# Patient Record
Sex: Female | Born: 1950 | Race: White | Hispanic: No | Smoking: Current every day smoker
Health system: Southern US, Community
[De-identification: ages and names within clinical notes are randomized; demographics above are authoritative.]

## PROBLEM LIST (undated history)

## (undated) DIAGNOSIS — I10 Essential (primary) hypertension: Secondary | ICD-10-CM

## (undated) DIAGNOSIS — Q248 Other specified congenital malformations of heart: Secondary | ICD-10-CM

## (undated) DIAGNOSIS — IMO0002 Reserved for concepts with insufficient information to code with codable children: Secondary | ICD-10-CM

## (undated) DIAGNOSIS — M8580 Other specified disorders of bone density and structure, unspecified site: Secondary | ICD-10-CM

## (undated) DIAGNOSIS — Z973 Presence of spectacles and contact lenses: Secondary | ICD-10-CM

## (undated) DIAGNOSIS — K469 Unspecified abdominal hernia without obstruction or gangrene: Secondary | ICD-10-CM

## (undated) DIAGNOSIS — I517 Cardiomegaly: Secondary | ICD-10-CM

## (undated) DIAGNOSIS — C50912 Malignant neoplasm of unspecified site of left female breast: Secondary | ICD-10-CM

## (undated) DIAGNOSIS — R011 Cardiac murmur, unspecified: Secondary | ICD-10-CM

## (undated) DIAGNOSIS — F17210 Nicotine dependence, cigarettes, uncomplicated: Secondary | ICD-10-CM

## (undated) DIAGNOSIS — E785 Hyperlipidemia, unspecified: Secondary | ICD-10-CM

## (undated) DIAGNOSIS — J42 Unspecified chronic bronchitis: Secondary | ICD-10-CM

## (undated) DIAGNOSIS — M1990 Unspecified osteoarthritis, unspecified site: Secondary | ICD-10-CM

## (undated) DIAGNOSIS — J189 Pneumonia, unspecified organism: Secondary | ICD-10-CM

## (undated) HISTORY — DX: Other specified congenital malformations of heart: Q24.8

## (undated) HISTORY — PX: COLONOSCOPY: SHX174

## (undated) HISTORY — PX: WISDOM TOOTH EXTRACTION: SHX21

## (undated) HISTORY — DX: Essential (primary) hypertension: I10

## (undated) HISTORY — PX: TUBAL LIGATION: SHX77

## (undated) HISTORY — DX: Unspecified abdominal hernia without obstruction or gangrene: K46.9

## (undated) HISTORY — DX: Nicotine dependence, cigarettes, uncomplicated: F17.210

## (undated) HISTORY — DX: Cardiomegaly: I51.7

## (undated) HISTORY — PX: TONSILLECTOMY: SUR1361

## (undated) HISTORY — DX: Hyperlipidemia, unspecified: E78.5

## (undated) HISTORY — DX: Other specified disorders of bone density and structure, unspecified site: M85.80

## (undated) HISTORY — PX: BREAST BIOPSY: SHX20

---

## 1998-08-29 ENCOUNTER — Other Ambulatory Visit: Admission: RE | Admit: 1998-08-29 | Discharge: 1998-08-29 | Payer: Self-pay | Admitting: Internal Medicine

## 1999-11-05 ENCOUNTER — Other Ambulatory Visit: Admission: RE | Admit: 1999-11-05 | Discharge: 1999-11-05 | Payer: Self-pay | Admitting: Internal Medicine

## 1999-12-07 ENCOUNTER — Encounter: Admission: RE | Admit: 1999-12-07 | Discharge: 1999-12-07 | Payer: Self-pay | Admitting: Internal Medicine

## 1999-12-07 ENCOUNTER — Encounter: Payer: Self-pay | Admitting: Internal Medicine

## 2000-12-17 ENCOUNTER — Encounter: Admission: RE | Admit: 2000-12-17 | Discharge: 2000-12-17 | Payer: Self-pay | Admitting: Family Medicine

## 2000-12-17 ENCOUNTER — Encounter: Payer: Self-pay | Admitting: Family Medicine

## 2000-12-23 ENCOUNTER — Encounter: Payer: Self-pay | Admitting: Family Medicine

## 2000-12-23 ENCOUNTER — Encounter: Admission: RE | Admit: 2000-12-23 | Discharge: 2000-12-23 | Payer: Self-pay | Admitting: Family Medicine

## 2002-02-15 ENCOUNTER — Encounter: Admission: RE | Admit: 2002-02-15 | Discharge: 2002-02-15 | Payer: Self-pay | Admitting: Family Medicine

## 2002-02-15 ENCOUNTER — Encounter: Payer: Self-pay | Admitting: Family Medicine

## 2003-02-23 ENCOUNTER — Encounter: Admission: RE | Admit: 2003-02-23 | Discharge: 2003-02-23 | Payer: Self-pay | Admitting: Family Medicine

## 2003-02-23 ENCOUNTER — Encounter: Payer: Self-pay | Admitting: Family Medicine

## 2003-03-22 ENCOUNTER — Encounter: Payer: Self-pay | Admitting: Family Medicine

## 2003-03-22 ENCOUNTER — Encounter: Admission: RE | Admit: 2003-03-22 | Discharge: 2003-03-22 | Payer: Self-pay | Admitting: Family Medicine

## 2003-03-31 ENCOUNTER — Encounter: Admission: RE | Admit: 2003-03-31 | Discharge: 2003-03-31 | Payer: Self-pay | Admitting: Family Medicine

## 2003-03-31 ENCOUNTER — Encounter: Payer: Self-pay | Admitting: Family Medicine

## 2004-03-28 ENCOUNTER — Encounter: Admission: RE | Admit: 2004-03-28 | Discharge: 2004-03-28 | Payer: Self-pay | Admitting: Family Medicine

## 2005-04-29 ENCOUNTER — Encounter: Admission: RE | Admit: 2005-04-29 | Discharge: 2005-04-29 | Payer: Self-pay | Admitting: Family Medicine

## 2006-06-19 ENCOUNTER — Encounter: Admission: RE | Admit: 2006-06-19 | Discharge: 2006-06-19 | Payer: Self-pay | Admitting: Family Medicine

## 2007-04-24 HISTORY — PX: US ECHOCARDIOGRAPHY: HXRAD669

## 2007-05-07 HISTORY — PX: CARDIOVASCULAR STRESS TEST: SHX262

## 2007-06-23 ENCOUNTER — Encounter: Admission: RE | Admit: 2007-06-23 | Discharge: 2007-06-23 | Payer: Self-pay | Admitting: Family Medicine

## 2007-07-20 ENCOUNTER — Encounter: Admission: RE | Admit: 2007-07-20 | Discharge: 2007-07-20 | Payer: Self-pay | Admitting: Family Medicine

## 2008-07-14 ENCOUNTER — Other Ambulatory Visit: Admission: RE | Admit: 2008-07-14 | Discharge: 2008-07-14 | Payer: Self-pay | Admitting: Family Medicine

## 2008-08-03 ENCOUNTER — Encounter: Admission: RE | Admit: 2008-08-03 | Discharge: 2008-08-03 | Payer: Self-pay | Admitting: Family Medicine

## 2009-08-16 ENCOUNTER — Encounter: Admission: RE | Admit: 2009-08-16 | Discharge: 2009-08-16 | Payer: Self-pay | Admitting: Family Medicine

## 2010-09-19 ENCOUNTER — Encounter: Admission: RE | Admit: 2010-09-19 | Discharge: 2010-09-19 | Payer: Self-pay | Admitting: *Deleted

## 2011-08-28 ENCOUNTER — Other Ambulatory Visit: Payer: Self-pay | Admitting: Cardiovascular Disease

## 2011-10-02 ENCOUNTER — Other Ambulatory Visit: Payer: Self-pay | Admitting: Family Medicine

## 2011-10-02 DIAGNOSIS — Z1231 Encounter for screening mammogram for malignant neoplasm of breast: Secondary | ICD-10-CM

## 2011-10-03 ENCOUNTER — Encounter: Payer: Self-pay | Admitting: Cardiovascular Disease

## 2011-10-09 ENCOUNTER — Ambulatory Visit (INDEPENDENT_AMBULATORY_CARE_PROVIDER_SITE_OTHER): Payer: BC Managed Care – PPO | Admitting: Cardiovascular Disease

## 2011-10-09 ENCOUNTER — Encounter: Payer: Self-pay | Admitting: Cardiovascular Disease

## 2011-10-09 DIAGNOSIS — E785 Hyperlipidemia, unspecified: Secondary | ICD-10-CM

## 2011-10-09 DIAGNOSIS — I421 Obstructive hypertrophic cardiomyopathy: Secondary | ICD-10-CM

## 2011-10-09 DIAGNOSIS — I10 Essential (primary) hypertension: Secondary | ICD-10-CM

## 2011-10-09 MED ORDER — ATORVASTATIN CALCIUM 20 MG PO TABS: 20.0000 mg | ORAL_TABLET | Freq: Every day | ORAL | Status: DC

## 2011-10-09 NOTE — Assessment & Plan Note (Signed)
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 She's on Toprol-XL. She does not have any significant murmur and I doubt that her gradient is significant. She's not having any symptoms. We'll continue with her same medications. I'll see her again in one year for office visit. We'll check fasting lipid profile at that time.

## 2011-10-09 NOTE — Assessment & Plan Note (Signed)
She's currently on simvastatin. I would prefer that she be on atorvastatin. We'll start her on atorvastatin 20 mg a day. We'll check a fasting lipid profile, basic metabolic profile, and HFP in 3 months and again in one year.

## 2011-10-09 NOTE — Progress Notes (Signed)
Patty Buckley Date of Birth  [birth year]  [facility] [address]    [address] [place], [place]  [address] [phone]  Fax  [phone]  History of Present Illness:  Patty Buckley 60 year old female the history of left ventricular outflow tract obstruction. She has a history of dyslipidemia and hypertension. She has done well since I last saw her. She's not had any episodes of chest pain or shortness of breath.  Current Outpatient Prescriptions on File Prior to Visit  Medication Sig Dispense Refill  . alendronate (FOSAMAX) 70 MG tablet Take 70 mg by mouth every 7 (seven) days. Take with a full glass of water on an empty stomach.       Marland Kitchen aspirin 81 MG tablet Take 81 mg by mouth daily.        . Choline Fenofibrate (TRILIPIX) 135 MG capsule Take 135 mg by mouth daily.        . Coenzyme Q10 (COQ10 PO) Take by mouth daily.        Marland Kitchen GLUCOSAMINE-CALCIUM-VIT D PO Take by mouth daily.        Marland Kitchen lisinopril (PRINIVIL,ZESTRIL) 20 MG tablet Take 20 mg by mouth daily.        . metoprolol (TOPROL-XL) 100 MG 24 hr tablet TAKE ONE TABLET BY MOUTH EVERY DAY  90 tablet  0  . Multiple Vitamin (MULTIVITAMIN) tablet Take 1 tablet by mouth daily.        . simvastatin (ZOCOR) 40 MG tablet Take 40 mg by mouth at bedtime.          Allergies  Allergen Reactions  . Codeine     Past Medical History  Diagnosis Date  . Hyperlipidemia   . Hypertension   . Left ventricular outflow obstruction   . Cigarette smoker     Past Surgical History  Procedure Date  . Tonsillectomy   . Tubal ligation   . US echocardiography 04/24/2007    EF 55-60%  . Cardiovascular stress test 05/07/2007    EF 82% NO ISCHEMIA    History  Smoking status  . Current Everyday Smoker -- 1.0 packs/day  Smokeless tobacco  . Not on file    History  Alcohol Use No    Family History  Problem Relation Age of Onset  . Hypertension Mother   . Heart attack Mother   . Heart failure Father   . Hypertension Father     Reviw  of Systems:  Reviewed in the HPI.  All other systems are negative.  Physical Exam: BP 118/75  Pulse 64  Ht 5\' 3"  (1.6 m)  Wt 172 lb 1.9 oz (78.073 kg)  BMI 30.49 kg/m2 The patient is alert and oriented x 3.  The mood and affect are normal.   Skin: warm and dry.  Color is normal.    HEENT:   Normocephalic/atraumatic. Her carotids are normal. There is no JVD.  Lungs: Her lungs are clear to auscultation   Heart: Regular rate, S1-S2  .  There is no significant murmur  Abdomen: Normal bowel sounds. There is no hepatosplenomegaly.  Extremities:  No clubbing cyanosis or edema  Neuro:  Nonfocal, her gait is normal    ECG: Normal sinus rhythm. Chest normal EKG.  Assessment / Plan:

## 2011-10-09 NOTE — Patient Instructions (Signed)
Your physician wants you to follow-up in: 1 year  You will receive a reminder letter in the mail two months in advance. If you don't receive a letter, please call our office to schedule the follow-up appointment.   Your physician has recommended you make the following change in your medication:  1) stop simvastatin  2) start atorvastatin 20 mg daily     Your physician recommends that you return for a FASTING lipid profile: 3 months and in 1 year

## 2011-10-28 ENCOUNTER — Ambulatory Visit
Admission: RE | Admit: 2011-10-28 | Discharge: 2011-10-28 | Disposition: A | Discharge status: Home / Self Care | Payer: BC Managed Care – PPO | Source: Ambulatory Visit | Attending: Family Medicine | Admitting: Family Medicine

## 2011-10-28 DIAGNOSIS — Z1231 Encounter for screening mammogram for malignant neoplasm of breast: Secondary | ICD-10-CM

## 2011-11-21 ENCOUNTER — Other Ambulatory Visit: Payer: Self-pay | Admitting: Cardiovascular Disease

## 2012-01-14 ENCOUNTER — Other Ambulatory Visit (INDEPENDENT_AMBULATORY_CARE_PROVIDER_SITE_OTHER): Payer: BC Managed Care – PPO | Admitting: *Deleted

## 2012-01-14 DIAGNOSIS — E785 Hyperlipidemia, unspecified: Secondary | ICD-10-CM

## 2012-01-14 DIAGNOSIS — I10 Essential (primary) hypertension: Secondary | ICD-10-CM

## 2012-01-14 LAB — BASIC METABOLIC PANEL
BUN: 12 mg/dL (ref 6–23)
Calcium: 9.3 mg/dL (ref 8.4–10.5)
Creatinine, Ser: 0.7 mg/dL (ref 0.4–1.2)
GFR: 88.74 mL/min (ref >60.00)
Glucose, Bld: 109 mg/dL — ABNORMAL HIGH (ref 70–99)

## 2012-01-14 LAB — HEPATIC FUNCTION PANEL
ALT: 38 U/L — ABNORMAL HIGH (ref 0–35)
AST: 50 U/L — ABNORMAL HIGH (ref 0–37)
Bilirubin, Direct: 0.1 mg/dL (ref 0.0–0.3)
Total Bilirubin: 1.1 mg/dL (ref 0.3–1.2)

## 2012-01-16 NOTE — Progress Notes (Signed)
MSG/TRYING TO REACH TO DC MED AND LAB 2-3 MONTHS

## 2012-01-17 ENCOUNTER — Telehealth: Payer: Self-pay | Admitting: *Deleted

## 2012-01-17 NOTE — Telephone Encounter (Signed)
Message copied by Antony Odea on Fri Jan 17, 2012 10:12 AM ------      Message from: [place], Deloris Ping      Created: Tue Jan 14, 2012  7:20 PM       LFTs are mildly elevated but not enough to warrant stopping atorvastatin.      Trigs are still elevated  . Continue to work on diet and exercise.

## 2012-01-17 NOTE — Telephone Encounter (Signed)
PT WILL STOP MED AND WILL CALL WHEN SHE CAN HAVE LABS RECHECKED.

## 2012-01-29 ENCOUNTER — Encounter (INDEPENDENT_AMBULATORY_CARE_PROVIDER_SITE_OTHER): Payer: Self-pay | Admitting: Surgery

## 2012-02-05 ENCOUNTER — Encounter (INDEPENDENT_AMBULATORY_CARE_PROVIDER_SITE_OTHER): Payer: Self-pay | Admitting: Surgery

## 2012-02-11 ENCOUNTER — Ambulatory Visit (INDEPENDENT_AMBULATORY_CARE_PROVIDER_SITE_OTHER): Payer: BC Managed Care – PPO | Admitting: Surgery

## 2012-05-20 ENCOUNTER — Other Ambulatory Visit: Payer: Self-pay | Admitting: Cardiovascular Disease

## 2012-05-20 NOTE — Telephone Encounter (Signed)
Fax Received. Refill Completed. Patty Buckley (R.M.A)   

## 2012-10-12 ENCOUNTER — Encounter: Payer: Self-pay | Admitting: Cardiovascular Disease

## 2012-10-12 ENCOUNTER — Ambulatory Visit (INDEPENDENT_AMBULATORY_CARE_PROVIDER_SITE_OTHER): Payer: Self-pay | Admitting: Cardiovascular Disease

## 2012-10-12 VITALS — BP 120/80 | HR 79 | Ht 63.0 in | Wt 163.8 lb

## 2012-10-12 DIAGNOSIS — I421 Obstructive hypertrophic cardiomyopathy: Secondary | ICD-10-CM

## 2012-10-12 DIAGNOSIS — E785 Hyperlipidemia, unspecified: Secondary | ICD-10-CM

## 2012-10-12 MED ORDER — METOPROLOL TARTRATE 50 MG PO TABS: 50.0000 mg | ORAL_TABLET | Freq: Two times a day (BID) | ORAL | Status: DC

## 2012-10-12 NOTE — Patient Instructions (Signed)
Your physician wants you to follow-up in: 1 year  You will receive a reminder letter in the mail two months in advance. If you don't receive a letter, please call our office to schedule the follow-up appointment.  Your physician has recommended you make the following change in your medication:   Stop toprol xl when finished and start metoprolol 50 mg one tablet twice daily 12 hours apart.

## 2012-10-12 NOTE — Progress Notes (Signed)
Patty Buckley Date of Birth  September 08, 1951 [facility] [address]    [address] [place], [place]  [address] [phone]  Fax  [phone]  Problem List: 1. Mild LVOT obstruction 2. HTN 3. Dyslipidemia  History of Present Illness:  Patty Buckley 61 year old female the history of left ventricular outflow tract obstruction. She has a history of dyslipidemia and hypertension. She has done well since I last saw her. She's not had any episodes of chest pain or shortness of breath.  She has lost her job and her insurance since I last saw her.  She has not had any problems.  Has not been exercising much.  Current Outpatient Prescriptions on File Prior to Visit  Medication Sig Dispense Refill  . acetaminophen (TYLENOL) 325 MG tablet Take 650 mg by mouth every 6 (six) hours as needed.      Marland Kitchen aspirin 81 MG tablet Take 81 mg by mouth daily.        . cholecalciferol (VITAMIN D) 1000 UNITS tablet Take 1,000 Units by mouth daily.      Marland Kitchen lisinopril (PRINIVIL,ZESTRIL) 20 MG tablet Take 20 mg by mouth daily.        . Multiple Vitamin (MULTIVITAMIN) tablet Take 1 tablet by mouth daily.        . metoprolol (LOPRESSOR) 50 MG tablet Take 1 tablet (50 mg total) by mouth 2 (two) times daily.  180 tablet  3    Allergies  Allergen Reactions  . Codeine     Past Medical History  Diagnosis Date  . Hyperlipidemia   . Hypertension   . Left ventricular outflow obstruction   . Cigarette smoker   . Vitamin D deficiency   . Osteopenia   . GERD (gastroesophageal reflux disease)   . Hemorrhoids   . Hernia   . Ventricular hypertrophy     Past Surgical History  Procedure Date  . Tonsillectomy   . Tubal ligation   . US echocardiography 04/24/2007    EF 55-60%  . Cardiovascular stress test 05/07/2007    EF 82% NO ISCHEMIA  . Wisdom tooth extraction     History  Smoking status  . Current Every Day Smoker -- 1.0 packs/day  Smokeless tobacco  . Not on file    History  Alcohol Use No     Family History  Problem Relation Age of Onset  . Hypertension Mother   . Heart attack Mother   . Heart failure Father   . Hypertension Father     Reviw of Systems:  Reviewed in the HPI.  All other systems are negative.  Physical Exam: BP 120/80  Pulse 79  Ht 5\' 3"  (1.6 m)  Wt 163 lb 12.8 oz (74.299 kg)  BMI 29.02 kg/m2  SpO2 95% The patient is alert and oriented x 3.  The mood and affect are normal.   Skin: warm and dry.  Color is normal.    HEENT:   Normocephalic/atraumatic. Her carotids are normal. There is no JVD.  Lungs: Her lungs are clear to auscultation   Heart: Regular rate, S1-S2  .  There is no significant murmur  Abdomen: Normal bowel sounds. There is no hepatosplenomegaly.  Extremities:  No clubbing cyanosis or edema  Neuro:  Nonfocal, her gait is normal    ECG:  Assessment / Plan:

## 2012-10-12 NOTE — Assessment & Plan Note (Signed)
She was on atorvastatin but it was stopped due to mild  liver elevation.  We recommended that she restart them but she has not done that due to cost and lack of insurance.

## 2012-10-12 NOTE — Assessment & Plan Note (Signed)
She is doing well.  We will change the metoprolol XL to metoprolol tartrate 50 BID to save her some money.  I have encouraged her to exercise and lose weight.

## 2012-10-23 ENCOUNTER — Encounter: Payer: Self-pay | Admitting: Cardiovascular Disease

## 2012-10-26 ENCOUNTER — Encounter: Payer: Self-pay | Admitting: Cardiovascular Disease

## 2013-02-08 ENCOUNTER — Other Ambulatory Visit: Payer: Self-pay

## 2013-02-08 MED ORDER — LISINOPRIL 20 MG PO TABS: 20.0000 mg | ORAL_TABLET | Freq: Every day | ORAL | Status: DC

## 2013-10-20 ENCOUNTER — Encounter: Payer: Self-pay | Admitting: Cardiovascular Disease

## 2013-10-29 ENCOUNTER — Encounter: Payer: Self-pay | Admitting: Cardiovascular Disease

## 2013-10-29 ENCOUNTER — Ambulatory Visit (INDEPENDENT_AMBULATORY_CARE_PROVIDER_SITE_OTHER): Payer: Self-pay | Admitting: Cardiovascular Disease

## 2013-10-29 VITALS — BP 142/68 | HR 89 | Ht 63.0 in | Wt 168.0 lb

## 2013-10-29 DIAGNOSIS — I421 Obstructive hypertrophic cardiomyopathy: Secondary | ICD-10-CM

## 2013-10-29 NOTE — Assessment & Plan Note (Signed)
Patty Buckley is doing well.  Continue with same meds.   Labs were ok a year ago.  She has lost her insurance and is trying to liimit the number of studies that she has.   No ECG today.  ;will consider fasting lipids next hear.

## 2013-10-29 NOTE — Patient Instructions (Addendum)
Your physician wants you to follow-up in: 1 year  You will receive a reminder letter in the mail two months in advance. If you don't receive a letter, please call our office to schedule the follow-up appointment.  [facility] Low Glycemic Diet (Source: [facility], 2006) Low Glycemic Foods (20-49) (Decrease risk of developing heart disease) Breakfast Cereals: All-Bran All-Bran Fruit 'n Oats Fiber One Oatmeal (not instant) Oat bran Fruits and fruit juices: (Limit to 1-2 servings per day) Apples Apricots (fresh & dried) Blackberries Blueberries Cherries Cranberries Peaches Pears Plums Prunes Grapefruit Raspberries Strawberries Tangerine Apple juice Grapefruit juice Tomato juice Beans and legumes (fresh-cooked): Black-eyed peas Butter beans Chick peas Lentils  Green beans Lima beans Kidney beans Navy beans Pinto beans Snow peas Non-starchy vegetables: Asparagus, avocado, broccoli, cabbage, cauliflower, celery, cucumber, greens, lettuce, mushrooms, peppers, tomatoes, okra, onions, spinach, summer squash Grains: Barley Bulgur Rye Wild rice Nuts and oils : Almonds Peanuts Sunflower seeds Hazelnuts Pecans Walnuts Oils that are liquid at room temperature Dairy, fish, meat, soy, and eggs: Milk, skim Lowfat cheese Yogurt, lowfat, fruit sugar sweetened Lean red meat Fish  Skinless chicken & [place] Shellfish Egg whites (up to 3 daily) Soy products  Egg yolks (up to 7 or _____ per week) Moderate Glycemic Foods (50-69) Breakfast Cereals: Bran Buds Bran Chex Just Right Mini-Wheats  Special K Swiss muesli Fruits: Banana (under-ripe) Dates Figs Grapes Kiwi Mango Oranges Raisins Fruit Juices: Cranberry juice Orange juice Beans and legumes: Boston-type baked beans Canned pinto, kidney, or navy beans Green peas Vegetables: Beets Carrots  Sweet potato Yam Corn on the cob Breads: Pita (pocket) bread Oat bran bread Pumpernickel bread Rye bread Wheat  bread, high fiber  Grains: Cornmeal Rice, brown Rice, white Couscous Pasta: Macaroni Pizza, cheese Ravioli, meat filled Spaghetti, white  Nuts: Cashews Macadamia Snacks: Chocolate Ice cream, lowfat Muffin Popcorn High Glycemic Foods (70-100)  Breakfast Cereals: Cheerios Corn Chex Corn Flakes Cream of Wheat Grape Nuts Grape Nut Flakes Grits Nutri-Grain Puffed Rice Puffed Wheat Rice Chex Rice Krispies Shredded Wheat Team Total Fruits: Pineapple Watermelon Banana (over-ripe) Beverages: Sodas, sweet tea, pineapple juice Vegetables: Potato, baked, boiled, fried, mashed Jamaica fries Canned or frozen corn Parsnips Winter squash Breads: Most breads (white and whole grain) Bagels Bread sticks Bread stuffing Kaiser roll Dinner rolls Grains: Rice, instant Tapioca, with milk Candy and most cookies Snacks: Donuts Corn chips Jelly beans Pretzels Pastries

## 2013-10-29 NOTE — Progress Notes (Signed)
[name] Date of Birth  1951-01-07 [facility] [address]    [address] [place], [place]  [address] [phone]  Fax  [phone]  Problem List: 1. Mild LVOT obstruction 2. HTN 3. Dyslipidemia  History of Present Illness:  Patty Buckley 62 year old female the history of left ventricular outflow tract obstruction. She has a history of dyslipidemia and hypertension. She has done well since I last saw her. She's not had any episodes of chest pain or shortness of breath.  She has lost her job and her insurance since I last saw her.  She has not had any problems.  Has not been exercising much.  Nov. 21, 2014  No CP , no dyspnea.    BP at home have been well controlled.    Getting some exercise.   Still out of work.     Current Outpatient Prescriptions on File Prior to Visit  Medication Sig Dispense Refill  . acetaminophen (TYLENOL) 325 MG tablet Take 650 mg by mouth every 6 (six) hours as needed.      Marland Kitchen aspirin 81 MG tablet Take 81 mg by mouth daily.        . cholecalciferol (VITAMIN D) 1000 UNITS tablet Take 1,000 Units by mouth. occasional      . lisinopril (PRINIVIL,ZESTRIL) 20 MG tablet Take 1 tablet (20 mg total) by mouth daily.  90 tablet  3  . metoprolol (LOPRESSOR) 50 MG tablet Take 1 tablet (50 mg total) by mouth 2 (two) times daily.  180 tablet  3  . Multiple Vitamin (MULTIVITAMIN) tablet Take 1 tablet by mouth. occasionally       No current facility-administered medications on file prior to visit.    Allergies  Allergen Reactions  . Codeine     Past Medical History  Diagnosis Date  . Hyperlipidemia   . Hypertension   . Left ventricular outflow obstruction   . Cigarette smoker   . Vitamin D deficiency   . Osteopenia   . GERD (gastroesophageal reflux disease)   . Hemorrhoids   . Hernia   . Ventricular hypertrophy     Past Surgical History  Procedure Laterality Date  . Tonsillectomy    . Tubal ligation    . US echocardiography  04/24/2007    EF  55-60%  . Cardiovascular stress test  05/07/2007    EF 82% NO ISCHEMIA  . Wisdom tooth extraction      History  Smoking status  . Current Every Day Smoker -- 1.00 packs/day  Smokeless tobacco  . Not on file    History  Alcohol Use No    Family History  Problem Relation Age of Onset  . Hypertension Mother   . Heart attack Mother   . Heart failure Father   . Hypertension Father     Reviw of Systems:  Reviewed in the HPI.  All other systems are negative.  Physical Exam: BP 142/68  Pulse 89  Ht 5\' 3"  (1.6 m)  Wt 168 lb (76.204 kg)  BMI 29.77 kg/m2 The patient is alert and oriented x 3.  The mood and affect are normal.   Skin: warm and dry.  Color is normal.    HEENT:   Normocephalic/atraumatic. Her carotids are normal. There is no JVD.  Lungs: Her lungs are clear to auscultation   Heart: Regular rate, S1-S2  .  There is no significant murmur  Abdomen: Normal bowel sounds. There is no hepatosplenomegaly.  Extremities:  No clubbing cyanosis or edema  Neuro:  Nonfocal, her gait is normal    ECG:  Assessment / Plan:

## 2013-11-21 ENCOUNTER — Other Ambulatory Visit: Payer: Self-pay | Admitting: Cardiovascular Disease

## 2014-02-01 ENCOUNTER — Other Ambulatory Visit: Payer: Self-pay | Admitting: Cardiovascular Disease

## 2014-02-16 ENCOUNTER — Other Ambulatory Visit: Payer: Self-pay | Admitting: Cardiovascular Disease

## 2014-04-25 ENCOUNTER — Other Ambulatory Visit: Payer: Self-pay

## 2014-05-03 ENCOUNTER — Other Ambulatory Visit: Payer: Self-pay | Admitting: Cardiovascular Disease

## 2014-05-19 ENCOUNTER — Other Ambulatory Visit: Payer: Self-pay | Admitting: Cardiovascular Disease

## 2014-08-17 ENCOUNTER — Other Ambulatory Visit: Payer: Self-pay | Admitting: Cardiovascular Disease

## 2014-10-31 ENCOUNTER — Other Ambulatory Visit: Payer: Self-pay | Admitting: Cardiovascular Disease

## 2014-11-15 ENCOUNTER — Encounter: Payer: Self-pay | Admitting: Cardiovascular Disease

## 2014-11-15 ENCOUNTER — Ambulatory Visit (INDEPENDENT_AMBULATORY_CARE_PROVIDER_SITE_OTHER): Payer: Self-pay | Admitting: Cardiovascular Disease

## 2014-11-15 VITALS — BP 118/76 | HR 59 | Ht 63.0 in | Wt 167.0 lb

## 2014-11-15 DIAGNOSIS — I421 Obstructive hypertrophic cardiomyopathy: Secondary | ICD-10-CM

## 2014-11-15 DIAGNOSIS — E785 Hyperlipidemia, unspecified: Secondary | ICD-10-CM

## 2014-11-15 NOTE — Assessment & Plan Note (Signed)
Continue current meds. Encourage her to exercise

## 2014-11-15 NOTE — Progress Notes (Signed)
Patty Buckley Date of Birth  [birth year] [facility] [address]    [place], [place]  [address] [phone]  Fax  [phone]  Problem List: 1. Mild LVOT obstruction 2. HTN 3. Dyslipidemia  History of Present Illness:  Patty Buckley 63 year old female the history of left ventricular outflow tract obstruction. She has a history of dyslipidemia and hypertension. She has done well since I last saw her. She's not had any episodes of chest pain or shortness of breath.  She has lost her job and her insurance since I last saw her.  She has not had any problems.  Has not been exercising much.  Nov. 21, 2014  No CP , no dyspnea.    BP at home have been well controlled.    Getting some exercise.   Still out of work.    Dec. 8, 2015:  Patty Buckley is doing well.  She is walking regularly. No CP or dyspnea.  Still smoking.   Advised her to quit. Wants to wait and check lipids after May ( will have Medicare at that time) , no insurance at this time .    Current Outpatient Prescriptions on File Prior to Visit  Medication Sig Dispense Refill  . acetaminophen (TYLENOL) 325 MG tablet Take 650 mg by mouth every 6 (six) hours as needed.    Marland Kitchen aspirin 81 MG tablet Take 81 mg by mouth daily.      Marland Kitchen lisinopril (PRINIVIL,ZESTRIL) 20 MG tablet TAKE ONE TABLET BY MOUTH ONCE DAILY 90 tablet 1  . metoprolol (LOPRESSOR) 50 MG tablet TAKE ONE TABLET BY MOUTH TWICE DAILY 180 tablet 0  . Multiple Vitamin (MULTIVITAMIN) tablet Take 1 tablet by mouth. occasionally     No current facility-administered medications on file prior to visit.    Allergies  Allergen Reactions  . Codeine     Past Medical History  Diagnosis Date  . Hyperlipidemia   . Hypertension   . Left ventricular outflow obstruction   . Cigarette smoker   . Vitamin D deficiency   . Osteopenia   . GERD (gastroesophageal reflux disease)   . Hemorrhoids   . Hernia   . Ventricular hypertrophy     Past Surgical History   Procedure Laterality Date  . Tonsillectomy    . Tubal ligation    . US echocardiography  04/24/2007    EF 55-60%  . Cardiovascular stress test  05/07/2007    EF 82% NO ISCHEMIA  . Wisdom tooth extraction      History  Smoking status  . Current Every Day Smoker -- 1.00 packs/day  Smokeless tobacco  . Not on file    History  Alcohol Use No    Family History  Problem Relation Age of Onset  . Hypertension Mother   . Heart attack Mother   . Heart failure Father   . Hypertension Father     Reviw of Systems:  Reviewed in the HPI.  All other systems are negative.  Physical Exam: BP 118/76 mmHg  Pulse 59  Ht 5\' 3"  (1.6 m)  Wt 167 lb (75.751 kg)  BMI 29.59 kg/m2 The patient is alert and oriented x 3.  The mood and affect are normal.   Skin: warm and dry.  Color is normal.    HEENT:   Normocephalic/atraumatic. Her carotids are normal. There is no JVD.  Lungs: Her lungs are clear to auscultation   Heart: Regular rate, S1-S2  .  There is no significant murmur  Abdomen: Normal bowel sounds. There is no hepatosplenomegaly.  Extremities:  No clubbing cyanosis or edema  Neuro:  Nonfocal, her gait is normal    ECG: Dec. 8, 2015:   Sinus brady at 59. No St or T wave changes.   Assessment / Plan:

## 2014-11-15 NOTE — Patient Instructions (Signed)
Your physician recommends that you continue on your current medications as directed. Please refer to the Current Medication list given to you today.  Your physician wants you to follow-up in: 1 year with Dr. Nahser.  You will receive a reminder letter in the mail two months in advance. If you don't receive a letter, please call our office to schedule the follow-up appointment.  

## 2014-11-15 NOTE — Assessment & Plan Note (Signed)
Stable

## 2014-11-21 ENCOUNTER — Other Ambulatory Visit: Payer: Self-pay | Admitting: Cardiovascular Disease

## 2014-12-06 ENCOUNTER — Other Ambulatory Visit: Payer: Self-pay | Admitting: Emergency Medicine

## 2014-12-06 DIAGNOSIS — N6320 Unspecified lump in the left breast, unspecified quadrant: Secondary | ICD-10-CM

## 2014-12-15 ENCOUNTER — Ambulatory Visit
Admission: RE | Admit: 2014-12-15 | Discharge: 2014-12-15 | Disposition: A | Discharge status: Home / Self Care | Payer: No Typology Code available for payment source | Source: Ambulatory Visit | Attending: Emergency Medicine | Admitting: Emergency Medicine

## 2014-12-15 DIAGNOSIS — N6320 Unspecified lump in the left breast, unspecified quadrant: Secondary | ICD-10-CM

## 2014-12-16 ENCOUNTER — Other Ambulatory Visit: Payer: Self-pay | Admitting: Obstetrics and Gynecology

## 2014-12-16 DIAGNOSIS — N6320 Unspecified lump in the left breast, unspecified quadrant: Secondary | ICD-10-CM

## 2014-12-22 ENCOUNTER — Encounter (HOSPITAL_COMMUNITY): Payer: Self-pay

## 2014-12-22 ENCOUNTER — Ambulatory Visit (HOSPITAL_COMMUNITY)
Admission: RE | Admit: 2014-12-22 | Discharge: 2014-12-22 | Disposition: A | Discharge status: Home / Self Care | Payer: Medicaid Other | Source: Ambulatory Visit | Attending: Obstetrics and Gynecology | Admitting: Obstetrics and Gynecology

## 2014-12-22 VITALS — BP 110/68 | Temp 97.5°F | Ht 63.0 in | Wt 166.2 lb

## 2014-12-22 DIAGNOSIS — N63 Unspecified lump in breast: Secondary | ICD-10-CM | POA: Diagnosis not present

## 2014-12-22 DIAGNOSIS — N6310 Unspecified lump in the right breast, unspecified quadrant: Secondary | ICD-10-CM

## 2014-12-22 DIAGNOSIS — Z01419 Encounter for gynecological examination (general) (routine) without abnormal findings: Secondary | ICD-10-CM

## 2014-12-22 DIAGNOSIS — N644 Mastodynia: Secondary | ICD-10-CM | POA: Insufficient documentation

## 2014-12-22 NOTE — Patient Instructions (Addendum)
Explained to Patty Buckley that [facility] will cover Pap smears and co-testing every 5 years unless has a history of abnormal Pap smears. Referred patient to the [place] for left breast biopsy per recommendation. Appointment scheduled for Tuesday, December 27, 2014 at 1400. Patient aware of appointment and will be there. Smoking cessation discussed with patient. Referred patient to the [facility] Quitline and gave resources to the free smoking cessation classes offered at the [facility]. Patty Buckley verbalized understanding.

## 2014-12-22 NOTE — Progress Notes (Signed)
Complaints of right breast lump x 12 weeks that began getting red two weeks later. Patient complained of left breast pain that comes and goes. Patient rates pain at a 4 out of 10.  Pap Smear:  Pap smear completed today. Patients last Pap smear was 5 years ago at [facility] on [facility] and normal per patient. Per patient has no history of an abnormal Pap smear. No Pap smear results in EPIC.  Physical exam: Breasts Breasts symmetrical. No skin abnormalities right breast. Left uppe No nipple retraction bilateral breasts. No nipple discharge bilateral breasts. No lymphadenopathy. No lumps palpated right breast. Palpated a left breast mass in the left upper inner quadrant of the breast. Patient complained of tenderness when palpated left breast mass. Referred patient to the [facility] for left breast biopsy per recommendation. Appointment scheduled for Tuesday, December 27, 2014 at 1400.         Pelvic/Bimanual   Ext Genitalia No lesions, no swelling and no discharge observed on external genitalia.         Vagina Vagina pink and normal texture. No lesions or discharge observed in vagina.          Cervix Cervix is present. Cervix pink and of normal texture. No discharge observed.     Uterus Uterus is present and palpable. Uterus in normal position and normal size.        Adnexae Bilateral ovaries present and palpable. No tenderness on palpation.          Rectovaginal No rectal exam completed today since patient had no rectal complaints. No skin abnormalities observed on exam.     Smoking cessation discussed with patient. Referred patient to the [facility] Quitline and gave resources to the free smoking cessation classes offered at the [facility].

## 2014-12-26 LAB — CYTOLOGY - PAP

## 2014-12-27 ENCOUNTER — Ambulatory Visit
Admission: RE | Admit: 2014-12-27 | Discharge: 2014-12-27 | Disposition: A | Discharge status: Home / Self Care | Payer: No Typology Code available for payment source | Source: Ambulatory Visit | Attending: Obstetrics and Gynecology | Admitting: Obstetrics and Gynecology

## 2014-12-27 ENCOUNTER — Other Ambulatory Visit: Payer: Self-pay | Admitting: Obstetrics and Gynecology

## 2014-12-27 DIAGNOSIS — N6320 Unspecified lump in the left breast, unspecified quadrant: Secondary | ICD-10-CM

## 2014-12-28 ENCOUNTER — Other Ambulatory Visit: Payer: Self-pay | Admitting: Surgery

## 2014-12-28 DIAGNOSIS — C50912 Malignant neoplasm of unspecified site of left female breast: Secondary | ICD-10-CM

## 2014-12-29 ENCOUNTER — Other Ambulatory Visit: Payer: Self-pay | Admitting: Surgery

## 2014-12-29 ENCOUNTER — Other Ambulatory Visit: Payer: Self-pay

## 2014-12-29 DIAGNOSIS — C50912 Malignant neoplasm of unspecified site of left female breast: Secondary | ICD-10-CM

## 2015-01-02 ENCOUNTER — Ambulatory Visit (INDEPENDENT_AMBULATORY_CARE_PROVIDER_SITE_OTHER): Payer: Self-pay | Admitting: Surgery

## 2015-01-02 ENCOUNTER — Other Ambulatory Visit (INDEPENDENT_AMBULATORY_CARE_PROVIDER_SITE_OTHER): Payer: Self-pay | Admitting: Surgery

## 2015-01-02 DIAGNOSIS — C50912 Malignant neoplasm of unspecified site of left female breast: Secondary | ICD-10-CM

## 2015-01-02 NOTE — H&P (Signed)
History of Present Illness Patty Buckley. Doralee Kocak MD; 01/02/2015 12:51 PM) Patient words: New Breast Pt.  The patient is a 64 year old female who presents with breast cancer. Patient does not have PCP  B[facility]CP referral MRI pending Prognostic profile pending.   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 She had not had a mammogram in 4 years. The mass has become much more firm recently and there is one small area that has begun to protrude with reddening and tenderness of the skin. She underwent mammogram and ultrasound on 12/15/14 followed by biopsy on 12/27/14. The biopsy clip did not deploy but the tumor is easily palpable. An abnormal appearing lymph node in the left axilla was biopsied and showed no sign of carcinoma. The main tumor showed invasive mammary carcinoma but prognostic profile pending. Scheduled for MRI 01/06/15. No Oncology appointment has yet been made.      CLINICAL DATA: Patient presents for evaluation of palpable left breast abnormality.  EXAM: DIGITAL DIAGNOSTIC BILATERAL MAMMOGRAM WITH CAD  ULTRASOUND LEFT BREAST  COMPARISON: [facility]  ACR Breast Density Category b: There are scattered areas of fibroglandular density.  FINDINGS: Underlying the palpable marker within the upper left breast is a 5.6 cm irregular mass. No additional concerning masses, calcifications or areas of architectural distortion identified within either breast.  Mammographic images were processed with CAD.  On physical exam, I palpate a firm mass within the superior aspect of the left breast.  Ultrasound is performed, showing a 4.7 x 2.8 x 4.7 cm irregular solid mass within the left breast 1230 o'clock 4 cm from the nipple with internal color vascularity. There is a cortically thickened 1.4 cm left axillary lymph  node.  IMPRESSION: Suspicious left breast mass.  Cortically thickened left axillary lymph node.  RECOMMENDATION: Ultrasound-guided core needle biopsy left breast mass and left axillary lymph node.  Patient will be referred to [facility].  I have discussed the findings and recommendations with the patient. Results were also provided in writing at the conclusion of the visit. If applicable, a reminder letter will be sent to the patient regarding the next appointment.  BI-RADS CATEGORY 4: Suspicious.   Electronically Signed By: Lovey Newcomer M.D. On: 12/15/2014 13:06   CLINICAL DATA: Approximate 6 cm suspicious mass in the upper inner quadrant of the left breast, initially noted by the patient approximately 3 months ago, with rapid interval growth. Abnormal left axillary lymph nodes demonstrating cortical thickening on diagnostic imaging.  EXAM: ULTRASOUND GUIDED LEFT BREAST CORE NEEDLE BIOPSY  COMPARISON: Previous exams.  FINDINGS: I met with the patient and we discussed the procedure of ultrasound-guided biopsy, including benefits and alternatives. We discussed the high likelihood of a successful procedure. We discussed the risks of the procedure, including infection, bleeding, tissue injury, clip migration, and inadequate sampling. Informed written consent was given. The usual time-out protocol was performed immediately prior to the procedure.  Using sterile technique and 2% Lidocaine as local anesthetic, under direct ultrasound visualization, a 14 gauge Marquee core needle device was used to perform biopsy of the large mass in the upper inner quadrant of the left breast using a medial approach. At the conclusion of the procedure a ribbon shaped tissue marker clip was deployed into the biopsy cavity. Follow up 2 view mammogram was performed and dictated separately.  IMPRESSION: Ultrasound guided biopsy of  an approximate 6 cm suspicious mass in the upper-inner  quadrant of the left breast. No apparent complications.   Electronically Signed By: Hulan Saas M.D. On: 12/27/2014 15:41   CLINICAL DATA: Approximate 6 cm suspicious mass in the upper inner quadrant of the left breast, initially noted by the patient approximately 3 months ago, with rapid interval growth. Abnormal left axillary lymph nodes demonstrating cortical thickening on diagnostic imaging.  EXAM: ULTRASOUND GUIDED CORE NEEDLE BIOPSY OF A LEFT AXILLARY NODE  COMPARISON: Previous exams.  FINDINGS: I met with the patient and we discussed the procedure of ultrasound-guided biopsy, including benefits and alternatives. We discussed the high likelihood of a successful procedure. We discussed the risks of the procedure, including infection, bleeding, tissue injury, clip migration, and inadequate sampling. Informed written consent was given. The usual time-out protocol was performed immediately prior to the procedure.  Using sterile technique and 2% Lidocaine as local anesthetic, under direct ultrasound visualization, a 14 gauge [place] spring-loaded core needle device was used to perform biopsy of an abnormal left axillary node though with cortical thickening using an inferior approach. There were no immediate complications.  IMPRESSION: Ultrasound guided biopsy of an abnormal left axillary lymph node with cortical thickening. No apparent complications.  Electronically Signed By: Hulan Saas M.D. On: 12/27/2014 15:41   CLINICAL DATA: Ultrasound-guided core needle biopsy of a large mass in the upper-inner quadrant of the left breast earlier today. Confirmation of clip placement.  EXAM: DIAGNOSTIC LEFT MAMMOGRAM POST ULTRASOUND BIOPSY  COMPARISON: Previous exams.  FINDINGS: Mammographic images were obtained following ultrasound guided biopsy of a large mass in the upper-inner quadrant of the left breast. The ribbon shaped tissue marker clip is not  visible in the mass or elsewhere in the left breast, indicating that it did not deploy. Gas bubbles are present within the anterior portion of the mass related to the biopsy, indicating the biopsy samples were indeed obtained from the mass. Expected post biopsy changes are present without evidence of hematoma.  IMPRESSION: The ribbon shaped tissue marker clip did not deploy and is not present within the mass or elsewhere in the left breast. Due to the size of the mass, I elected not to go back and place a clip, as subsequent localization will not be hindered in the absence of the clip.  Final Assessment: Post Procedure Mammograms for Marker Placement  Electronically Signed: By: Hulan Saas M.D. On: 12/27/2014 15:40   ADDENDUM REPORT: 12/28/2014 14:00  ADDENDUM: I spoke with the patient on 12/28/2014 to relay pathology results from recent ultrasound-guided core needle biopsy left breast. Pathology results demonstrate left breast invasive mammary carcinoma. The lymph node was negative for carcinoma. Pathology results are concordant with imaging findings. Patient has an appointment with Dr. Corliss Skains on 01/02/2015 at 10:45 a.m. Additionally she will be contacted with a preoperative MRI appointment. Patient reports no complaints at the biopsy site. She is instructed to call the breast center with any additional concerns or questions.   Electronically Signed By: Annia Belt M.D. On: 12/28/2014 14:00   Diagnosis 1. Breast, left, needle core biopsy, UIQ - INVASIVE MAMMARY CARCINOMA, SEE COMMENT. 2. Lymph node, needle/core biopsy, left axillary - ONE LYMPH NODE, NEGATIVE FOR TUMOR (0/1). SEE COMMENT. Microscopic Comment 1. Needle core biopsies demonstrate extensive involvement by invasive grade III mammary carcinoma with abundant necrosis. Breast prognostic studies are pending and will be reported in an addendum. The case was reviewed with Dr. Raynald Blend who concurs. 2.  Needle core biopsy does not exclude focal  involvement by metastatic tumor. (CRR:gt, 12/28/14) [place]i RUND DO Pathologist, Electronic Signature (Case signed 12/28/2014) Specimen   Other Problems (Crystal Dollard, RN, BSN; 01/02/2015 10:54 AM) Arthritis Back Pain Breast Cancer Heart murmur High blood pressure Hypercholesterolemia Lump In Breast Umbilical Hernia Repair  Past Surgical History Erasmo Leventhal, RN, BSN; 01/02/2015 10:54 AM) Breast Biopsy Left. Colon Polyp Removal - Colonoscopy Oral Surgery Tonsillectomy  Diagnostic Studies History Erasmo Leventhal, RN, BSN; 01/02/2015 10:54 AM) Colonoscopy 5-10 years ago Mammogram within last year Pap Smear 1-5 years ago  Allergies Erasmo Leventhal, RN, BSN; 01/02/2015 10:55 AM) Codeine Sulfate *ANALGESICS - OPIOID*  Medication History Erasmo Leventhal, RN, BSN; 01/02/2015 10:57 AM) Tylenol 8 Hour (650MG  Tablet ER, Oral as needed) Active. Aspirin EC (81MG  Tablet DR, Oral daily) Active. Lisinopril (20MG  Tablet, Oral daily) Active. Lopressor (50MG  Tablet, Oral daily) Active. Multiple Vitamin (1 (one) Oral daily) Active.  Social History (Erasmo Leventhal, RN, BSN; 01/02/2015 10:54 AM) Alcohol use Occasional alcohol use. Caffeine use Coffee. No drug use Tobacco use Current every day smoker.  Family History (Erasmo Leventhal, RN, BSN; 01/02/2015 10:54 AM) Heart Disease Father, Mother. Heart disease in female family member before age 25 Hypertension Father, Mother.  Pregnancy / Birth History Erasmo Leventhal, RN, BSN; 01/02/2015 10:54 AM) Age at menarche 29 years. Age of menopause 70-50 Gravida 2 Irregular periods Maternal age 62-30 Para 2     Review of Systems Patty Buckley. Anice Wilshire MD; 01/02/2015 12:38 PM) General Not Present- Appetite Loss, Chills, Fatigue, Fever, Night Sweats, Weight Gain and Weight Loss. Skin Present- New Lesions. Not Present- Change in Wart/Mole, Dryness, Hives, Jaundice,  Non-Healing Wounds, Rash and Ulcer. HEENT Present- Wears glasses/contact lenses. Not Present- Earache, Hearing Loss, Hoarseness, Nose Bleed, Oral Ulcers, Ringing in the Ears, Seasonal Allergies, Sinus Pain, Sore Throat, Visual Disturbances and Yellow Eyes. Respiratory Not Present- Bloody sputum, Chronic Cough, Difficulty Breathing, Snoring and Wheezing. Breast Present- Breast Mass, Breast Pain and Skin Changes. Not Present- Nipple Discharge. Cardiovascular Not Present- Chest Pain, Difficulty Breathing Lying Down, Leg Cramps, Palpitations, Rapid Heart Rate, Shortness of Breath and Swelling of Extremities. Gastrointestinal Not Present- Abdominal Pain, Bloating, Bloody Stool, Change in Bowel Habits, Chronic diarrhea, Constipation, Difficulty Swallowing, Excessive gas, Gets full quickly at meals, Hemorrhoids, Indigestion, Nausea, Rectal Pain and Vomiting. Female Genitourinary Not Present- Frequency, Nocturia, Painful Urination, Pelvic Pain and Urgency. Musculoskeletal Present- Back Pain and Joint Stiffness. Not Present- Joint Pain, Muscle Pain, Muscle Weakness and Swelling of Extremities. Neurological Not Present- Decreased Memory, Fainting, Headaches, Numbness, Seizures, Tingling, Tremor, Trouble walking and Weakness. Psychiatric Not Present- Anxiety, Bipolar, Change in Sleep Pattern, Depression, Fearful and Frequent crying. Endocrine Not Present- Cold Intolerance, Excessive Hunger, Hair Changes, Heat Intolerance, Hot flashes and New Diabetes. Hematology Not Present- Easy Bruising, Excessive bleeding, Gland problems, HIV and Persistent Infections.  Note: Menarche - age 70 First pregnancy - age 66 Breastfeed - yes Oral contraceptives - 31 years Menopause - age 83 Family history - negative for breast ca;   Vitals Occupational hygienist, BSN; 01/02/2015 10:59 AM) 01/02/2015 10:57 AM Weight: 164.8 lb Height: 63in Body Surface Area: 1.82 m Body Mass Index: 29.19 kg/m Temp.: 97.27F(Oral)   Pulse: 80 (Regular)  Resp.: 18 (Unlabored)  BP: 110/80 (Sitting, Left Arm, Standard)     Physical Exam Rodman Key K. Maekayla Giorgio MD; 01/02/2015 12:44 PM)  The physical exam findings are as follows: Note:WDWN in NAD HEENT: EOMI, sclera anicteric Neck: No masses, no thyromegaly Lungs: CTA bilaterally; normal respiratory effort Breasts: Right breast - no palpable masses,  no lymphadenopathy; no nipple retraction or discharge Left breast - no palpable lymphadenopathy; no nipple retraction or discharge; large 8 cm firm area involving the entire upper part of the breast, extending almost to the upper margin of the breast. In the center of this mass, there is a 1.5 cm area of erythema with firm tumor mass below the skin; no fluctuance noted. The remainder of the skin is normal in appearance. CV: Regular rate and rhythm; no murmurs Abd: +bowel sounds, soft, non-tender, no masses Ext: Well-perfused; no edema Skin: Warm, dry; no sign of jaundice    Assessment & Plan Rodman Key K. Brieanne Mignone MD; 01/02/2015 12:48 PM)  MALIGNANT NEOPLASM OF UPPER-INNER QUADRANT OF LEFT FEMALE BREAST (174.2  C50.212)  Current Plans Schedule for Surgery - right subclavian vein port placement. The surgical procedure has been discussed with the patient. Potential risks, benefits, alternative treatments, and expected outcomes have been explained. All of the patient's questions at this time have been answered. The likelihood of reaching the patient's treatment goal is good. The patient understand the proposed surgical procedure and wishes to proceed. Note:Large 6-7 cm invasive breast carcinoma - left upper breast.  Abnormal-appearing lymph nodes on ultrasound, but biopsy negative MRI pending. Appears to be some invasion of the skin with overlying erythema and tenderness  I discussed the situation with the patient. She will likely need neoadjuvant chemotherapy to try to shrink the tumor. She will likely still need mastectomy.  I discussed the risks and benefits of port placement with her.   We will refer her urgently to Oncology for neoadjuvant chemotherapy and will await her MRI results.   Patty Buckley. Georgette Dover, MD, [facility]  General/ Trauma Surgery  01/02/2015 12:51 PM

## 2015-01-03 ENCOUNTER — Telehealth (HOSPITAL_COMMUNITY): Payer: Self-pay | Admitting: *Deleted

## 2015-01-03 NOTE — Telephone Encounter (Signed)
Patient was in office today and discussed normal pap smear results. HPV was also negative. Next pap smear due in 5 years. Patient voiced understanding. BCCCP Medicaid paperwork completed.

## 2015-01-05 ENCOUNTER — Telehealth: Payer: Self-pay | Admitting: Nurse Practitioner

## 2015-01-05 NOTE — Telephone Encounter (Signed)
In review of upcoming schedule for Dr. Acie Fredrickson for 2/5, I saw visit scheduled for surgical clearance  for this patient who was last seen in the office 11/15/14.  I spoke with Amy at Dr. Vonna Kotyk office who advised patient is scheduled for port placement under general anesthesia on 2/3 and needs cardiac clearance.  I advised Amy that I will send to Dr. Acie Fredrickson and if patient does not need appointment, I will call patient to cancel and will fax clearance to Amy at [phone].

## 2015-01-05 NOTE — Telephone Encounter (Signed)
Last office visit and note of clearance from Dr. Acie Fredrickson faxed ATTN:  Amy to [phone]

## 2015-01-05 NOTE — Telephone Encounter (Signed)
Patty Buckley is stable and is at low risk for cardiac complications with port placement.  She may proceed with surgery from my standpoint. She was just seen 1-2 months ago and does not need another pre-op visit from my standpoint.

## 2015-01-06 ENCOUNTER — Ambulatory Visit
Admission: RE | Admit: 2015-01-06 | Discharge: 2015-01-06 | Disposition: A | Discharge status: Home / Self Care | Payer: No Typology Code available for payment source | Source: Ambulatory Visit | Attending: Surgery | Admitting: Surgery

## 2015-01-06 DIAGNOSIS — C50912 Malignant neoplasm of unspecified site of left female breast: Secondary | ICD-10-CM

## 2015-01-06 MED ORDER — GADOBENATE DIMEGLUMINE 529 MG/ML IV SOLN: 15.0000 mL | Freq: Once | INTRAVENOUS | Status: AC | PRN

## 2015-01-09 ENCOUNTER — Ambulatory Visit: Payer: Self-pay

## 2015-01-09 ENCOUNTER — Telehealth: Payer: Self-pay | Admitting: *Deleted

## 2015-01-09 ENCOUNTER — Ambulatory Visit (HOSPITAL_BASED_OUTPATIENT_CLINIC_OR_DEPARTMENT_OTHER): Payer: Medicaid Other | Admitting: Hematology and Oncology

## 2015-01-09 ENCOUNTER — Telehealth: Payer: Self-pay | Admitting: Hematology and Oncology

## 2015-01-09 ENCOUNTER — Ambulatory Visit (HOSPITAL_BASED_OUTPATIENT_CLINIC_OR_DEPARTMENT_OTHER): Payer: Medicaid Other

## 2015-01-09 ENCOUNTER — Encounter: Payer: Self-pay | Admitting: Hematology and Oncology

## 2015-01-09 ENCOUNTER — Encounter (HOSPITAL_BASED_OUTPATIENT_CLINIC_OR_DEPARTMENT_OTHER): Payer: Self-pay | Admitting: *Deleted

## 2015-01-09 ENCOUNTER — Encounter: Payer: Self-pay | Admitting: *Deleted

## 2015-01-09 VITALS — BP 115/73 | HR 72 | Temp 97.7°F | Resp 18 | Ht 62.0 in | Wt 166.5 lb

## 2015-01-09 DIAGNOSIS — C50212 Malignant neoplasm of upper-inner quadrant of left female breast: Secondary | ICD-10-CM

## 2015-01-09 LAB — COMPREHENSIVE METABOLIC PANEL (CC13)
ALT: 15 U/L (ref 0–55)
AST: 15 U/L (ref 5–34)
Albumin: 3.4 g/dL — ABNORMAL LOW (ref 3.5–5.0)
Alkaline Phosphatase: 81 U/L (ref 40–150)
Anion Gap: 10 mEq/L (ref 3–11)
BILIRUBIN TOTAL: 0.54 mg/dL (ref 0.20–1.20)
BUN: 8 mg/dL (ref 7.0–26.0)
CO2: 27 meq/L (ref 22–29)
Calcium: 9.2 mg/dL (ref 8.4–10.4)
Chloride: 107 mEq/L (ref 98–109)
Creatinine: 0.8 mg/dL (ref 0.6–1.1)
EGFR: 75 mL/min/{1.73_m2} — ABNORMAL LOW (ref >90)
GLUCOSE: 90 mg/dL (ref 70–140)
Potassium: 4.8 mEq/L (ref 3.5–5.1)
Sodium: 144 mEq/L (ref 136–145)
Total Protein: 6.7 g/dL (ref 6.4–8.3)

## 2015-01-09 LAB — CBC WITH DIFFERENTIAL/PLATELET
BASO%: 1 % (ref 0.0–2.0)
Basophils Absolute: 0.1 10*3/uL (ref 0.0–0.1)
EOS%: 1.6 % (ref 0.0–7.0)
Eosinophils Absolute: 0.2 10*3/uL (ref 0.0–0.5)
HCT: 46.8 % — ABNORMAL HIGH (ref 34.8–46.6)
HGB: 15.2 g/dL (ref 11.6–15.9)
LYMPH#: 2.8 10*3/uL (ref 0.9–3.3)
LYMPH%: 27.9 % (ref 14.0–49.7)
MCH: 32.3 pg (ref 25.1–34.0)
MCHC: 32.5 g/dL (ref 31.5–36.0)
MCV: 99.5 fL (ref 79.5–101.0)
MONO#: 0.9 10*3/uL (ref 0.1–0.9)
MONO%: 9 % (ref 0.0–14.0)
NEUT#: 6 10*3/uL (ref 1.5–6.5)
NEUT%: 60.5 % (ref 38.4–76.8)
PLATELETS: 285 10*3/uL (ref 145–400)
RBC: 4.7 10*6/uL (ref 3.70–5.45)
RDW: 13.6 % (ref 11.2–14.5)
WBC: 10 10*3/uL (ref 3.9–10.3)

## 2015-01-09 MED ORDER — ONDANSETRON HCL 8 MG PO TABS: 8.0000 mg | ORAL_TABLET | Freq: Two times a day (BID) | ORAL | Status: DC | PRN

## 2015-01-09 MED ORDER — DEXAMETHASONE 4 MG PO TABS: ORAL_TABLET | ORAL | Status: DC

## 2015-01-09 MED ORDER — LORAZEPAM 0.5 MG PO TABS: 0.5000 mg | ORAL_TABLET | Freq: Four times a day (QID) | ORAL | Status: DC | PRN

## 2015-01-09 MED ORDER — PROCHLORPERAZINE MALEATE 10 MG PO TABS: 10.0000 mg | ORAL_TABLET | Freq: Four times a day (QID) | ORAL | Status: DC | PRN

## 2015-01-09 MED ORDER — LIDOCAINE-PRILOCAINE 2.5-2.5 % EX CREA: TOPICAL_CREAM | CUTANEOUS | Status: DC

## 2015-01-09 NOTE — Assessment & Plan Note (Signed)
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 ER 0%, PR 0%, HER-2 negative ratio 1.38, Ki-67 98%  Pathology and radiology counseling:Discussed with the patient, the details of pathology including the type of breast cancer,the clinical staging, the significance of ER, PR and HER-2/neu receptors and the implications for treatment. After reviewing the pathology in detail, we proceeded to discuss the different treatment options between surgery, radiation, chemotherapy.  Recommendation: 1. Neoadjuvant chemotherapy with dose dense Adriamycin and Cytoxan followed by Taxol and carboplatin (presuming there are no distant metastatic disease on PET/CT) 2. Followed by surgery with mastectomy and radiation 3. Genetic counseling is not indicated because she is over the age of 1   Plan: 1. PET/CT scan 2. Tumor board presentation 3. Port placement, Echocardiogram, chemotherapy education 4. Initiate chemotherapy in 2 weeks

## 2015-01-09 NOTE — Telephone Encounter (Signed)
, °

## 2015-01-09 NOTE — Progress Notes (Signed)
[facility] CONSULT NOTE  Patient Care Team: Antony Blackbird, MD as PCP - General (Family Medicine)  CHIEF COMPLAINTS/PURPOSE OF CONSULTATION:  Newly diagnosed breast cancer  HISTORY OF PRESENTING ILLNESS:  Patty Buckley 64 y.o. female is here because of recent diagnosis of left breast cancer. Patient felt a small lump in the breast which was very soft initially and she did not think it was anything significant. Since she did not have a primary care physician she watched and monitored it. The lump over the last month or so started to grow very correctly to the point that it started eroding through the skin. She then went to an urgent care who then set her up for a mammogram and subsequently she underwent a biopsy on January 19. The biopsy came back as invasive mammary cancer grade 3 with the high Ki-67 and triple negative disease. She underwent a breast MRI that showed this very large enhancing mass with evidence of necrosis extending for quite a lengthy distance in the upper portion of the breast. She was referred to me by her surgeon to discuss treatment options. Patient does not have health insurance and is applying for Medicaid currently. She lives home by herself her son is currently living with her for other reasons. She has a sister who is her main support. But she is here today by herself.  I reviewed her records extensively and collaborated the history with the patient.  SUMMARY OF ONCOLOGIC HISTORY:   Breast cancer of upper-inner quadrant of left female breast   12/27/2014 Initial Diagnosis Grade 3 invasive mammary cancer, one lymph node negative in left axilla, ER 0%, PR 0%, Ki-67 98%, HER-2 negative ratio 1.38   01/06/2015 Breast MRI Left breast large enhancing mass, evidence of necrosis, extending from posterior through the middle third of left breast, extending from chest wall to the dermis, close to pectoralis fascia extending 4.5 cm, several bilateral axillary lymph  nodes    MEDICAL HISTORY:  Past Medical History  Diagnosis Date  . Hyperlipidemia   . Hypertension   . Left ventricular outflow obstruction   . Cigarette smoker   . Vitamin D deficiency   . Osteopenia   . GERD (gastroesophageal reflux disease)   . Hemorrhoids   . Hernia   . Ventricular hypertrophy     SURGICAL HISTORY: Past Surgical History  Procedure Laterality Date  . Tonsillectomy    . Tubal ligation    . US echocardiography  04/24/2007    EF 55-60%  . Cardiovascular stress test  05/07/2007    EF 82% NO ISCHEMIA  . Wisdom tooth extraction      SOCIAL HISTORY: History   Social History  . Marital Status: Divorced    Spouse Name: N/A    Number of Children: N/A  . Years of Education: N/A   Occupational History  . Not on file.   Social History Main Topics  . Smoking status: Current Every Day Smoker -- 1.00 packs/day  . Smokeless tobacco: Not on file  . Alcohol Use: Yes     Comment: wine occassionally  . Drug Use: No  . Sexual Activity: Yes    Birth Control/ Protection: Surgical   Other Topics Concern  . Not on file   Social History Narrative    FAMILY HISTORY: Family History  Problem Relation Age of Onset  . Hypertension Mother   . Heart attack Mother   . Heart failure Father   . Hypertension Father  ALLERGIES:  is allergic to codeine.  MEDICATIONS:  Current Outpatient Prescriptions  Medication Sig Dispense Refill  . acetaminophen (TYLENOL) 325 MG tablet Take 650 mg by mouth every 6 (six) hours as needed.    Marland Kitchen aspirin 81 MG tablet Take 81 mg by mouth daily.      Marland Kitchen lisinopril (PRINIVIL,ZESTRIL) 20 MG tablet TAKE ONE TABLET BY MOUTH ONCE DAILY 90 tablet 1  . metoprolol (LOPRESSOR) 50 MG tablet TAKE ONE TABLET BY MOUTH TWICE DAILY 180 tablet 3  . Multiple Vitamin (MULTIVITAMIN) tablet Take 1 tablet by mouth. occasionally     No current facility-administered medications for this visit.    REVIEW OF SYSTEMS:   Constitutional: Denies fevers,  chills or abnormal night sweats Eyes: Denies blurriness of vision, double vision or watery eyes Ears, nose, mouth, throat, and face: Denies mucositis or sore throat Respiratory: Denies cough, dyspnea or wheezes Cardiovascular: Denies palpitation, chest discomfort or lower extremity swelling Gastrointestinal:  Denies nausea, heartburn or change in bowel habits Skin: Denies abnormal skin rashes Lymphatics: Denies new lymphadenopathy or easy bruising Neurological:Denies numbness, tingling or new weaknesses Behavioral/Psych: Mood is stable, no new changes  Breast: Large tumor with red area involving the upper portion of the left breast All other systems were reviewed with the patient and are negative.  PHYSICAL EXAMINATION: ECOG PERFORMANCE STATUS: 1 - Symptomatic but completely ambulatory  Filed Vitals:   01/09/15 0917  BP: 115/73  Pulse: 72  Temp: 97.7 F (36.5 C)  Resp: 18   Filed Weights   01/09/15 0917  Weight: 166 lb 8 oz (75.524 kg)    GENERAL:alert, no distress and comfortable SKIN: skin color, texture, turgor are normal, no rashes or significant lesions EYES: normal, conjunctiva are pink and non-injected, sclera clear OROPHARYNX:no exudate, no erythema and lips, buccal mucosa, and tongue normal  NECK: supple, thyroid normal size, non-tender, without nodularity LYMPH:  no palpable lymphadenopathy in the cervical, axillary or inguinal LUNGS: clear to auscultation and percussion with normal breathing effort HEART: regular rate & rhythm and no murmurs and no lower extremity edema ABDOMEN:abdomen soft, non-tender and normal bowel sounds Musculoskeletal:no cyanosis of digits and no clubbing  PSYCH: alert & oriented x 3 with fluent speech NEURO: no focal motor/sensory deficits BREAST: Very large palpable mass in the upper quadrant of the right breast appears to be fixated to the skin as well as the subcutaneous tissues and from the muscle.Marland Kitchen No palpable axillary or  supraclavicular lymphadenopathy (exam performed in the presence of a chaperone)   LABORATORY DATA:  I have reviewed the data as listed Lab Results  Component Value Date   WBC 10.0 01/09/2015   HGB 15.2 01/09/2015   HCT 46.8* 01/09/2015   MCV 99.5 01/09/2015   PLT 285 01/09/2015   Lab Results  Component Value Date   NA 144 01/09/2015   K 4.8 01/09/2015   CL 107 01/14/2012   CO2 27 01/09/2015    RADIOGRAPHIC STUDIES: I have personally reviewed the radiological reports and agreed with the findings in the report. 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 ER 0%, PR 0%, HER-2 negative ratio 1.38, Ki-67 98%  Pathology and radiology counseling:Discussed with the patient, the details of pathology including the type of breast cancer,the clinical staging, the significance of ER, PR and HER-2/neu receptors  and the implications for treatment. After reviewing the pathology in detail, we proceeded to discuss the different treatment options between surgery, radiation, chemotherapy.  Recommendation: 1. Neoadjuvant chemotherapy with dose dense Adriamycin and Cytoxan followed by Taxol and carboplatin (presuming there are no distant metastatic disease on CT chest abdomen and pelvis and a bone scan) 2. Followed by surgery with mastectomy and radiation 3. Genetic counseling is not indicated because she is over the age of 72   Plan: 1. CT chest abdomen and pelvis and a bone scan 2. Tumor board presentation 3. Port placement, Echocardiogram, chemotherapy education 4. Initiate chemotherapy in 2 weeks  All questions were answered. The patient knows to call the clinic with any problems, questions or concerns.    Rulon Eisenmenger, MD 12:30 PM

## 2015-01-09 NOTE — Telephone Encounter (Signed)
Met pt during Dr. Lindi Adie new pt appt. Gave pt navigation resources and contact information. Pt denies further needs at this time.

## 2015-01-09 NOTE — Progress Notes (Signed)
Pt had labs today-ekg 12/15-sees dr Cathie Olden for htn

## 2015-01-09 NOTE — Telephone Encounter (Signed)
Per staff message and POF I have scheduled appts. Advised scheduler of appts. JMW  

## 2015-01-09 NOTE — Progress Notes (Signed)
Note created by Dr. Gudena during office visit. Copy to patient, original to scan. 

## 2015-01-09 NOTE — Progress Notes (Signed)
Checked in new pt with no insurance.  Pt has been working with [employer].  Gabriel Cirri assisted pt with applying for Medicaid.  I gave pt a financial application to apply for assistance thru the hospital until Medicaid becomes effective.  I will forward pt's info to [facility] for possible drug replacement.  Pt has Raquel's card for any billing insurance or concerns.

## 2015-01-11 ENCOUNTER — Ambulatory Visit (HOSPITAL_COMMUNITY): Payer: Medicaid Other

## 2015-01-11 ENCOUNTER — Encounter (HOSPITAL_BASED_OUTPATIENT_CLINIC_OR_DEPARTMENT_OTHER): Payer: Self-pay | Admitting: *Deleted

## 2015-01-11 ENCOUNTER — Ambulatory Visit: Payer: Self-pay | Admitting: Cardiovascular Disease

## 2015-01-11 ENCOUNTER — Ambulatory Visit (HOSPITAL_BASED_OUTPATIENT_CLINIC_OR_DEPARTMENT_OTHER)
Admission: RE | Admit: 2015-01-11 | Discharge: 2015-01-11 | Disposition: A | Discharge status: Home / Self Care | Payer: Medicaid Other | Source: Ambulatory Visit | Attending: Surgery | Admitting: Surgery

## 2015-01-11 ENCOUNTER — Encounter (HOSPITAL_BASED_OUTPATIENT_CLINIC_OR_DEPARTMENT_OTHER)
Admission: RE | Disposition: A | Discharge status: Home / Self Care | Payer: Self-pay | Source: Ambulatory Visit | Attending: Surgery

## 2015-01-11 ENCOUNTER — Ambulatory Visit (HOSPITAL_BASED_OUTPATIENT_CLINIC_OR_DEPARTMENT_OTHER): Payer: Medicaid Other | Admitting: Anesthesiology

## 2015-01-11 ENCOUNTER — Other Ambulatory Visit: Payer: Self-pay

## 2015-01-11 ENCOUNTER — Other Ambulatory Visit: Payer: Self-pay | Admitting: Radiology

## 2015-01-11 ENCOUNTER — Ambulatory Visit (HOSPITAL_COMMUNITY): Payer: Self-pay

## 2015-01-11 ENCOUNTER — Other Ambulatory Visit (INDEPENDENT_AMBULATORY_CARE_PROVIDER_SITE_OTHER): Payer: Self-pay | Admitting: Surgery

## 2015-01-11 DIAGNOSIS — I10 Essential (primary) hypertension: Secondary | ICD-10-CM | POA: Insufficient documentation

## 2015-01-11 DIAGNOSIS — Z8249 Family history of ischemic heart disease and other diseases of the circulatory system: Secondary | ICD-10-CM | POA: Insufficient documentation

## 2015-01-11 DIAGNOSIS — M1990 Unspecified osteoarthritis, unspecified site: Secondary | ICD-10-CM | POA: Diagnosis not present

## 2015-01-11 DIAGNOSIS — C50912 Malignant neoplasm of unspecified site of left female breast: Secondary | ICD-10-CM | POA: Insufficient documentation

## 2015-01-11 DIAGNOSIS — Z885 Allergy status to narcotic agent status: Secondary | ICD-10-CM | POA: Diagnosis not present

## 2015-01-11 DIAGNOSIS — Z419 Encounter for procedure for purposes other than remedying health state, unspecified: Secondary | ICD-10-CM

## 2015-01-11 DIAGNOSIS — K219 Gastro-esophageal reflux disease without esophagitis: Secondary | ICD-10-CM | POA: Insufficient documentation

## 2015-01-11 DIAGNOSIS — Z87891 Personal history of nicotine dependence: Secondary | ICD-10-CM | POA: Diagnosis not present

## 2015-01-11 DIAGNOSIS — E780 Pure hypercholesterolemia: Secondary | ICD-10-CM | POA: Diagnosis not present

## 2015-01-11 DIAGNOSIS — Z452 Encounter for adjustment and management of vascular access device: Secondary | ICD-10-CM

## 2015-01-11 HISTORY — PX: PORTACATH PLACEMENT: SHX2246

## 2015-01-11 HISTORY — DX: Presence of spectacles and contact lenses: Z97.3

## 2015-01-11 SURGERY — INSERTION, TUNNELED CENTRAL VENOUS DEVICE, WITH PORT
Anesthesia: General | Site: Chest | Laterality: Right

## 2015-01-11 MED ORDER — EPHEDRINE SULFATE 50 MG/ML IJ SOLN
INTRAMUSCULAR | Status: DC | PRN
  Administered 2015-01-11 (×5): 10 mg via INTRAVENOUS

## 2015-01-11 MED ORDER — HYDROCODONE-ACETAMINOPHEN 5-325 MG PO TABS: 1.0000 | ORAL_TABLET | ORAL | Status: DC | PRN

## 2015-01-11 MED ORDER — CEFAZOLIN SODIUM-DEXTROSE 2-3 GM-% IV SOLR
2.0000 g | INTRAVENOUS | Status: AC
  Administered 2015-01-11: 2 g via INTRAVENOUS

## 2015-01-11 MED ORDER — OXYCODONE HCL 5 MG PO TABS: 5.0000 mg | ORAL_TABLET | Freq: Once | ORAL | Status: DC | PRN

## 2015-01-11 MED ORDER — HYDROMORPHONE HCL 1 MG/ML IJ SOLN: 0.2500 mg | INTRAMUSCULAR | Status: DC | PRN

## 2015-01-11 MED ORDER — PROPOFOL 10 MG/ML IV BOLUS
INTRAVENOUS | Status: DC | PRN
  Administered 2015-01-11: 200 mg via INTRAVENOUS

## 2015-01-11 MED ORDER — MIDAZOLAM HCL 2 MG/2ML IJ SOLN: 1.0000 mg | INTRAMUSCULAR | Status: DC | PRN

## 2015-01-11 MED ORDER — MIDAZOLAM HCL 2 MG/2ML IJ SOLN
INTRAMUSCULAR | Status: AC
  Filled 2015-01-11: qty 2

## 2015-01-11 MED ORDER — OXYCODONE HCL 5 MG/5ML PO SOLN: 5.0000 mg | Freq: Once | ORAL | Status: DC | PRN

## 2015-01-11 MED ORDER — HEPARIN (PORCINE) IN NACL 2-0.9 UNIT/ML-% IJ SOLN
INTRAMUSCULAR | Status: DC | PRN
  Administered 2015-01-11: 1 via INTRAVENOUS

## 2015-01-11 MED ORDER — LIDOCAINE HCL (CARDIAC) 20 MG/ML IV SOLN
INTRAVENOUS | Status: DC | PRN
  Administered 2015-01-11: 100 mg via INTRAVENOUS

## 2015-01-11 MED ORDER — SODIUM BICARBONATE 4 % IV SOLN
INTRAVENOUS | Status: AC
  Filled 2015-01-11: qty 5

## 2015-01-11 MED ORDER — CEFAZOLIN SODIUM-DEXTROSE 2-3 GM-% IV SOLR
INTRAVENOUS | Status: AC
  Filled 2015-01-11: qty 50

## 2015-01-11 MED ORDER — FENTANYL CITRATE 0.05 MG/ML IJ SOLN: 50.0000 ug | INTRAMUSCULAR | Status: DC | PRN

## 2015-01-11 MED ORDER — LACTATED RINGERS IV SOLN
INTRAVENOUS | Status: DC
  Administered 2015-01-11 (×2): via INTRAVENOUS

## 2015-01-11 MED ORDER — MORPHINE SULFATE 2 MG/ML IJ SOLN: 2.0000 mg | INTRAMUSCULAR | Status: DC | PRN

## 2015-01-11 MED ORDER — ONDANSETRON HCL 4 MG/2ML IJ SOLN
INTRAMUSCULAR | Status: DC | PRN
  Administered 2015-01-11: 4 mg via INTRAVENOUS

## 2015-01-11 MED ORDER — PROPOFOL 10 MG/ML IV EMUL
INTRAVENOUS | Status: AC
  Filled 2015-01-11: qty 50

## 2015-01-11 MED ORDER — BUPIVACAINE-EPINEPHRINE 0.25% -1:200000 IJ SOLN
INTRAMUSCULAR | Status: DC | PRN
  Administered 2015-01-11: 10 mL

## 2015-01-11 MED ORDER — FENTANYL CITRATE 0.05 MG/ML IJ SOLN
INTRAMUSCULAR | Status: DC | PRN
  Administered 2015-01-11: 100 ug via INTRAVENOUS

## 2015-01-11 MED ORDER — LIDOCAINE HCL (PF) 1 % IJ SOLN
INTRAMUSCULAR | Status: AC
  Filled 2015-01-11: qty 30

## 2015-01-11 MED ORDER — CHLORHEXIDINE GLUCONATE 4 % EX LIQD: 1.0000 "application " | Freq: Once | CUTANEOUS | Status: DC

## 2015-01-11 MED ORDER — ONDANSETRON HCL 4 MG/2ML IJ SOLN: 4.0000 mg | INTRAMUSCULAR | Status: DC | PRN

## 2015-01-11 MED ORDER — FENTANYL CITRATE 0.05 MG/ML IJ SOLN
INTRAMUSCULAR | Status: AC
  Filled 2015-01-11: qty 4

## 2015-01-11 MED ORDER — MIDAZOLAM HCL 5 MG/5ML IJ SOLN
INTRAMUSCULAR | Status: DC | PRN
  Administered 2015-01-11: 2 mg via INTRAVENOUS

## 2015-01-11 MED ORDER — ONDANSETRON HCL 4 MG/2ML IJ SOLN: 4.0000 mg | Freq: Once | INTRAMUSCULAR | Status: DC | PRN

## 2015-01-11 MED ORDER — DEXAMETHASONE SODIUM PHOSPHATE 4 MG/ML IJ SOLN
INTRAMUSCULAR | Status: DC | PRN
  Administered 2015-01-11: 10 mg via INTRAVENOUS

## 2015-01-11 SURGICAL SUPPLY — 56 items
APL SKNCLS STERI-STRIP NONHPOA (GAUZE/BANDAGES/DRESSINGS) ×1
BAG DECANTER FOR FLEXI CONT (MISCELLANEOUS) ×3 IMPLANT
BENZOIN TINCTURE PRP APPL 2/3 (GAUZE/BANDAGES/DRESSINGS) ×3 IMPLANT
BLADE SURG 11 STRL SS (BLADE) ×3 IMPLANT
BLADE SURG 15 STRL LF DISP TIS (BLADE) ×1 IMPLANT
BLADE SURG 15 STRL SS (BLADE) ×3
CANISTER SUCT 1200ML W/VALVE (MISCELLANEOUS) IMPLANT
CATH SINGLE LUMEN 9.6F (PORTABLE EQUIPMENT SUPPLIES) IMPLANT
CHLORAPREP W/TINT 26ML (MISCELLANEOUS) ×3 IMPLANT
CLEANER CAUTERY TIP 5X5 PAD (MISCELLANEOUS) ×1 IMPLANT
CLOSURE WOUND 1/2 X4 (GAUZE/BANDAGES/DRESSINGS) ×1
COVER BACK TABLE 60X90IN (DRAPES) ×3 IMPLANT
COVER MAYO STAND STRL (DRAPES) ×3 IMPLANT
DECANTER SPIKE VIAL GLASS SM (MISCELLANEOUS) ×3 IMPLANT
DRAPE C-ARM 42X72 X-RAY (DRAPES) ×3 IMPLANT
DRAPE LAPAROTOMY TRNSV 102X78 (DRAPE) ×3 IMPLANT
DRAPE UTILITY XL STRL (DRAPES) ×3 IMPLANT
DRSG TEGADERM 4X4.75 (GAUZE/BANDAGES/DRESSINGS) ×3 IMPLANT
ELECT REM PT RETURN 9FT ADLT (ELECTROSURGICAL) ×3
ELECTRODE REM PT RTRN 9FT ADLT (ELECTROSURGICAL) ×1 IMPLANT
GLOVE BIO SURGEON STRL SZ 6.5 (GLOVE) ×1 IMPLANT
GLOVE BIO SURGEON STRL SZ7 (GLOVE) ×3 IMPLANT
GLOVE BIO SURGEONS STRL SZ 6.5 (GLOVE) ×1
GLOVE BIOGEL PI IND STRL 7.5 (GLOVE) ×1 IMPLANT
GLOVE BIOGEL PI INDICATOR 7.5 (GLOVE) ×2
GLOVE EXAM NITRILE LRG STRL (GLOVE) ×2 IMPLANT
GOWN STRL REUS W/ TWL LRG LVL3 (GOWN DISPOSABLE) ×1 IMPLANT
GOWN STRL REUS W/TWL LRG LVL3 (GOWN DISPOSABLE) ×3
IV KIT MINILOC 20X1 SAFETY (NEEDLE) IMPLANT
KIT PORT POWER 8FR ISP CVUE (Catheter) IMPLANT
NDL HYPO 25X1 1.5 SAFETY (NEEDLE) ×1 IMPLANT
NDL SAFETY ECLIPSE 18X1.5 (NEEDLE) IMPLANT
NDL SPNL 22GX3.5 QUINCKE BK (NEEDLE) IMPLANT
NEEDLE HYPO 18GX1.5 SHARP (NEEDLE)
NEEDLE HYPO 25X1 1.5 SAFETY (NEEDLE) ×3 IMPLANT
NEEDLE SPNL 22GX3.5 QUINCKE BK (NEEDLE) IMPLANT
PACK BASIN DAY SURGERY FS (CUSTOM PROCEDURE TRAY) ×3 IMPLANT
PAD CLEANER CAUTERY TIP 5X5 (MISCELLANEOUS) ×2
PENCIL BUTTON HOLSTER BLD 10FT (ELECTRODE) ×3 IMPLANT
SHEATH COOK PEEL AWAY SET 9F (SHEATH) IMPLANT
SLEEVE SCD COMPRESS KNEE MED (MISCELLANEOUS) ×3 IMPLANT
SPONGE GAUZE 2X2 8PLY STER LF (GAUZE/BANDAGES/DRESSINGS) ×1
SPONGE GAUZE 2X2 8PLY STRL LF (GAUZE/BANDAGES/DRESSINGS) ×2 IMPLANT
SPONGE GAUZE 4X4 12PLY STER LF (GAUZE/BANDAGES/DRESSINGS) IMPLANT
STRIP CLOSURE SKIN 1/2X4 (GAUZE/BANDAGES/DRESSINGS) ×2 IMPLANT
SUT MON AB 4-0 PC3 18 (SUTURE) ×3 IMPLANT
SUT PROLENE 2 0 CT2 30 (SUTURE) ×3 IMPLANT
SUT VIC AB 3-0 SH 27 (SUTURE) ×3
SUT VIC AB 3-0 SH 27X BRD (SUTURE) ×1 IMPLANT
SYR 5ML LUER SLIP (SYRINGE) ×3 IMPLANT
SYR CONTROL 10ML LL (SYRINGE) ×3 IMPLANT
TOWEL OR 17X24 6PK STRL BLUE (TOWEL DISPOSABLE) ×3 IMPLANT
TOWEL OR NON WOVEN STRL DISP B (DISPOSABLE) ×3 IMPLANT
TUBE CONNECTING 20'X1/4 (TUBING)
TUBE CONNECTING 20X1/4 (TUBING) IMPLANT
YANKAUER SUCT BULB TIP NO VENT (SUCTIONS) IMPLANT

## 2015-01-11 NOTE — Op Note (Signed)
PreOp diagnosis - left breast cancer Post-op diagnosis - same Procedure:  Attempted right subclavian vein port placement - aborted due to venous occlusion Surgeon:Aneli Zara K. Anesthesia:  GEN - LMA Indications:  This is a 64 yo female with a recently diagnosed left breast cancer who presents for port placement for neoadjuvant chemotherapy.    Description of procedure:  The patient was brought to the OR and placed in a supine position on the OR table.  After an adequate level general anesthesia was obtained she was positioned with a roll behind her shoulders and her arms tucked at her sides. Her right neck and chest were prepped with ChloraPrep and draped sterile fashion. A timeout was taken to ensure the proper patient proper procedure. We infiltrated the area below the right clavicle with 0.25% Marcaine with epinephrine. An 18-gauge needle was used to cannulate the subclavian vein. A guidewire was passed and fluoroscopy confirmed that the wire past down the superior vena cava to the right atrium. The needle was removed. We created a subcutaneous tissues pocket below the insertion site through a separate incision. A substantial tunnel was created between the insertion site and subcutaneous tissues pocket. An 8 [place] Port-A-Cath was brought on the field. The catheter was tunneled from the pocket to the insertion site. The port was placed in the pocket and secured with 2-0 Prolene sutures. We then passed the dilator and sheath over the wire under fluoroscopic view. This passed down into these appear vena cava. The wire and dilator were then removed. We then attempted to pass the catheter through the breakaway sheath. As the catheter exit sheath under fluoroscopy it seemed to meet an occlusion. We tried to reposition the sheath unsuccessfully. I passed the dilator again and there still seemed to be an occlusion in this area. I removed the sheath and dilator. We open a new set and I again cannulated the  subclavian vein and pass a wire down into the right atrium. I again passed the dilator and sheath over the wire seem to pass into the superior vena cava. However when I again tried to remove the dilator and wire I was unable to pass the catheter through the sheath. At this point I decided to port procedure and we will ask interventional radiology to perform a venogram. They may be able cannulate her internal jugular and passes directly down to the vena cava wall avoiding the subclavian. However I think it would be preferable to determine whether she has a chronic occlusion of the subclavian vein. The patient seemed stable from a ventilatory and oxygenation standpoint. We held pressure for several minutes. The subcutaneous pocket insertion site were both closed with 4-0 Monocryl. Steri-Strips were applied. She was exit and brought to recovery in stable condition. All sponge, instrument, needle counts are correct.   Imogene Burn. Georgette Dover, MD, [facility]  General/ Trauma Surgery  01/11/2015 9:30 AM

## 2015-01-11 NOTE — Anesthesia Procedure Notes (Signed)
Procedure Name: LMA Insertion Date/Time: 01/11/2015 8:34 AM Performed by: Lyndee Leo Pre-anesthesia Checklist: Patient identified, Emergency Drugs available, Suction available and Patient being monitored Patient Re-evaluated:Patient Re-evaluated prior to inductionOxygen Delivery Method: Circle System Utilized Preoxygenation: Pre-oxygenation with 100% oxygen Intubation Type: IV induction Ventilation: Mask ventilation without difficulty LMA: LMA inserted LMA Size: 4.0 Number of attempts: 1 Airway Equipment and Method: Bite block Placement Confirmation: positive ETCO2 Tube secured with: Tape Dental Injury: Teeth and Oropharynx as per pre-operative assessment

## 2015-01-11 NOTE — Anesthesia Postprocedure Evaluation (Signed)
  Anesthesia Post-op Note  Patient: Patty Buckley  Procedure(s) Performed: Procedure(s) with comments: INSERTION PORT-A-CATH (Right) -    Attempted  failed to implant,   Patient Location: PACU  Anesthesia Type: General   Level of Consciousness: awake, alert  and oriented  Airway and Oxygen Therapy: Patient Spontanous Breathing  Post-op Pain: mild  Post-op Assessment: Post-op Vital signs reviewed  Post-op Vital Signs: Reviewed  Last Vitals:  Filed Vitals:   01/11/15 1000  BP: 101/58  Pulse: 88  Temp:   Resp: 16    Complications: No apparent anesthesia complications

## 2015-01-11 NOTE — Discharge Instructions (Signed)
°  Post Anesthesia Home Care Instructions  Activity: Get plenty of rest for the remainder of the day. A responsible adult should stay with you for 24 hours following the procedure.  For the next 24 hours, DO NOT: -Drive a car -Paediatric nurse -Drink alcoholic beverages -Take any medication unless instructed by your physician -Make any legal decisions or sign important papers.  Meals: Start with liquid foods such as gelatin or soup. Progress to regular foods as tolerated. Avoid greasy, spicy, heavy foods. If nausea and/or vomiting occur, drink only clear liquids until the nausea and/or vomiting subsides. Call your physician if vomiting continues.  Special Instructions/Symptoms: Your throat may feel dry or sore from the anesthesia or the breathing tube placed in your throat during surgery. If this causes discomfort, gargle with warm salt water. The discomfort should disappear within 24 hours.   Call your surgeon if you experience:   1.  Fever over 101.0. 2.  Inability to urinate. 3.  Nausea and/or vomiting. 4.  Extreme swelling or bruising at the surgical site. 5.  Continued bleeding from the incision. 6.  Increased pain, redness or drainage from the incision. 7.  Problems related to your pain medication. 8. Any change in color, movement and/or sensation 9. Any problems and/or concerns

## 2015-01-11 NOTE — Transfer of Care (Signed)
Immediate Anesthesia Transfer of Care Note  Patient: Patty Buckley  Procedure(s) Performed: Procedure(s) with comments: INSERTION PORT-A-CATH (Right) -    Attempted  failed to implant,   Patient Location: PACU  Anesthesia Type:General  Level of Consciousness: awake, sedated and patient cooperative  Airway & Oxygen Therapy: Patient Spontanous Breathing and Patient connected to face mask oxygen  Post-op Assessment: Report given to RN and Post -op Vital signs reviewed and stable  Post vital signs: Reviewed and stable  Last Vitals:  Filed Vitals:   01/11/15 0710  BP: 115/64  Pulse: 72  Temp: 36.7 C  Resp: 18    Complications: No apparent anesthesia complications

## 2015-01-11 NOTE — Anesthesia Preprocedure Evaluation (Signed)
Anesthesia Evaluation  Patient identified by MRN, date of birth, ID band Patient awake    Reviewed: Allergy & Precautions, NPO status , Patient's Chart, lab work & pertinent test results  Airway Mallampati: I  TM Distance: >3 FB Neck ROM: Full    Dental  (+) Teeth Intact, Dental Advisory Given   Pulmonary Current Smoker,  breath sounds clear to auscultation        Cardiovascular hypertension, Pt. on medications Rhythm:Regular Rate:Normal     Neuro/Psych    GI/Hepatic GERD-  ,  Endo/Other    Renal/GU      Musculoskeletal   Abdominal   Peds  Hematology   Anesthesia Other Findings   Reproductive/Obstetrics                             Anesthesia Physical Anesthesia Plan  ASA: I  Anesthesia Plan: General   Post-op Pain Management:    Induction: Intravenous  Airway Management Planned: LMA  Additional Equipment:   Intra-op Plan:   Post-operative Plan: Extubation in OR  Informed Consent: I have reviewed the patients History and Physical, chart, labs and discussed the procedure including the risks, benefits and alternatives for the proposed anesthesia with the patient or authorized representative who has indicated his/her understanding and acceptance.   Dental advisory given  Plan Discussed with: CRNA and Anesthesiologist  Anesthesia Plan Comments:         Anesthesia Quick Evaluation

## 2015-01-11 NOTE — H&P (View-Only) (Signed)
History of Present Illness Patty Buckley. Patty Compston MD; 01/02/2015 12:51 PM) Patient words: New Breast Pt.  The patient is a 64 year old female who presents with breast cancer. Patient does not have PCP  BCCCP referral MRI pending Prognostic profile pending.   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 She had not had a mammogram in 4 years. The mass has become much more firm recently and there is one small area that has begun to protrude with reddening and tenderness of the skin. She underwent mammogram and ultrasound on 12/15/14 followed by biopsy on 12/27/14. The biopsy clip did not deploy but the tumor is easily palpable. An abnormal appearing lymph node in the left axilla was biopsied and showed no sign of carcinoma. The main tumor showed invasive mammary carcinoma but prognostic profile pending. Scheduled for MRI 01/06/15. No Oncology appointment has yet been made.      CLINICAL DATA: Patient presents for evaluation of palpable left breast abnormality.  EXAM: DIGITAL DIAGNOSTIC BILATERAL MAMMOGRAM WITH CAD  ULTRASOUND LEFT BREAST  COMPARISON: [facility]  ACR Breast Density Category b: There are scattered areas of fibroglandular density.  FINDINGS: Underlying the palpable marker within the upper left breast is a 5.6 cm irregular mass. No additional concerning masses, calcifications or areas of architectural distortion identified within either breast.  Mammographic images were processed with CAD.  On physical exam, I palpate a firm mass within the superior aspect of the left breast.  Ultrasound is performed, showing a 4.7 x 2.8 x 4.7 cm irregular solid mass within the left breast 1230 o'clock 4 cm from the nipple with internal color vascularity. There is a cortically thickened 1.4 cm left axillary lymph  node.  IMPRESSION: Suspicious left breast mass.  Cortically thickened left axillary lymph node.  RECOMMENDATION: Ultrasound-guided core needle biopsy left breast mass and left axillary lymph node.  Patient will be referred to [facility].  I have discussed the findings and recommendations with the patient. Results were also provided in writing at the conclusion of the visit. If applicable, a reminder letter will be sent to the patient regarding the next appointment.  BI-RADS CATEGORY 4: Suspicious.   Electronically Signed By: Lovey Newcomer M.D. On: 12/15/2014 13:06   CLINICAL DATA: Approximate 6 cm suspicious mass in the upper inner quadrant of the left breast, initially noted by the patient approximately 3 months ago, with rapid interval growth. Abnormal left axillary lymph nodes demonstrating cortical thickening on diagnostic imaging.  EXAM: ULTRASOUND GUIDED LEFT BREAST CORE NEEDLE BIOPSY  COMPARISON: Previous exams.  FINDINGS: I met with the patient and we discussed the procedure of ultrasound-guided biopsy, including benefits and alternatives. We discussed the high likelihood of a successful procedure. We discussed the risks of the procedure, including infection, bleeding, tissue injury, clip migration, and inadequate sampling. Informed written consent was given. The usual time-out protocol was performed immediately prior to the procedure.  Using sterile technique and 2% Lidocaine as local anesthetic, under direct ultrasound visualization, a 14 gauge Marquee core needle device was used to perform biopsy of the large mass in the upper inner quadrant of the left breast using a medial approach. At the conclusion of the procedure a ribbon shaped tissue marker clip was deployed into the biopsy cavity. Follow up 2 view mammogram was performed and dictated separately.  IMPRESSION: Ultrasound guided biopsy of  an approximate 6 cm suspicious mass in the upper-inner  quadrant of the left breast. No apparent complications.   Electronically Signed By: Evangeline Dakin M.D. On: 12/27/2014 15:41   CLINICAL DATA: Approximate 6 cm suspicious mass in the upper inner quadrant of the left breast, initially noted by the patient approximately 3 months ago, with rapid interval growth. Abnormal left axillary lymph nodes demonstrating cortical thickening on diagnostic imaging.  EXAM: ULTRASOUND GUIDED CORE NEEDLE BIOPSY OF A LEFT AXILLARY NODE  COMPARISON: Previous exams.  FINDINGS: I met with the patient and we discussed the procedure of ultrasound-guided biopsy, including benefits and alternatives. We discussed the high likelihood of a successful procedure. We discussed the risks of the procedure, including infection, bleeding, tissue injury, clip migration, and inadequate sampling. Informed written consent was given. The usual time-out protocol was performed immediately prior to the procedure.  Using sterile technique and 2% Lidocaine as local anesthetic, under direct ultrasound visualization, a 14 gauge [place] spring-loaded core needle device was used to perform biopsy of an abnormal left axillary node though with cortical thickening using an inferior approach. There were no immediate complications.  IMPRESSION: Ultrasound guided biopsy of an abnormal left axillary lymph node with cortical thickening. No apparent complications.  Electronically Signed By: Evangeline Dakin M.D. On: 12/27/2014 15:41   CLINICAL DATA: Ultrasound-guided core needle biopsy of a large mass in the upper-inner quadrant of the left breast earlier today. Confirmation of clip placement.  EXAM: DIAGNOSTIC LEFT MAMMOGRAM POST ULTRASOUND BIOPSY  COMPARISON: Previous exams.  FINDINGS: Mammographic images were obtained following ultrasound guided biopsy of a large mass in the upper-inner quadrant of the left breast. The ribbon shaped tissue marker clip is not  visible in the mass or elsewhere in the left breast, indicating that it did not deploy. Gas bubbles are present within the anterior portion of the mass related to the biopsy, indicating the biopsy samples were indeed obtained from the mass. Expected post biopsy changes are present without evidence of hematoma.  IMPRESSION: The ribbon shaped tissue marker clip did not deploy and is not present within the mass or elsewhere in the left breast. Due to the size of the mass, I elected not to go back and place a clip, as subsequent localization will not be hindered in the absence of the clip.  Final Assessment: Post Procedure Mammograms for Marker Placement  Electronically Signed: By: Evangeline Dakin M.D. On: 12/27/2014 15:40   ADDENDUM REPORT: 12/28/2014 14:00  ADDENDUM: I spoke with the patient on 12/28/2014 to relay pathology results from recent ultrasound-guided core needle biopsy left breast. Pathology results demonstrate left breast invasive mammary carcinoma. The lymph node was negative for carcinoma. Pathology results are concordant with imaging findings. Patient has an appointment with Dr. Georgette Dover on 01/02/2015 at 10:45 a.m. Additionally she will be contacted with a preoperative MRI appointment. Patient reports no complaints at the biopsy site. She is instructed to call the breast center with any additional concerns or questions.   Electronically Signed By: Lovey Newcomer M.D. On: 12/28/2014 14:00   Diagnosis 1. Breast, left, needle core biopsy, UIQ - INVASIVE MAMMARY CARCINOMA, SEE COMMENT. 2. Lymph node, needle/core biopsy, left axillary - ONE LYMPH NODE, NEGATIVE FOR TUMOR (0/1). SEE COMMENT. Microscopic Comment 1. Needle core biopsies demonstrate extensive involvement by invasive grade III mammary carcinoma with abundant necrosis. Breast prognostic studies are pending and will be reported in an addendum. The case was reviewed with Dr. Avis Epley who concurs. 2.  Needle core biopsy does not exclude focal  involvement by metastatic tumor. (CRR:gt, 12/28/14) [place]i RUND DO Pathologist, Electronic Signature (Case signed 12/28/2014) Specimen   Other Problems (Crystal Dollard, RN, BSN; 01/02/2015 10:54 AM) Arthritis Back Pain Breast Cancer Heart murmur High blood pressure Hypercholesterolemia Lump In Breast Umbilical Hernia Repair  Past Surgical History Erasmo Leventhal, RN, BSN; 01/02/2015 10:54 AM) Breast Biopsy Left. Colon Polyp Removal - Colonoscopy Oral Surgery Tonsillectomy  Diagnostic Studies History Erasmo Leventhal, RN, BSN; 01/02/2015 10:54 AM) Colonoscopy 5-10 years ago Mammogram within last year Pap Smear 1-5 years ago  Allergies Erasmo Leventhal, RN, BSN; 01/02/2015 10:55 AM) Codeine Sulfate *ANALGESICS - OPIOID*  Medication History Erasmo Leventhal, RN, BSN; 01/02/2015 10:57 AM) Tylenol 8 Hour (650MG  Tablet ER, Oral as needed) Active. Aspirin EC (81MG  Tablet DR, Oral daily) Active. Lisinopril (20MG  Tablet, Oral daily) Active. Lopressor (50MG  Tablet, Oral daily) Active. Multiple Vitamin (1 (one) Oral daily) Active.  Social History (Erasmo Leventhal, RN, BSN; 01/02/2015 10:54 AM) Alcohol use Occasional alcohol use. Caffeine use Coffee. No drug use Tobacco use Current every day smoker.  Family History (Erasmo Leventhal, RN, BSN; 01/02/2015 10:54 AM) Heart Disease Father, Mother. Heart disease in female family member before age 19 Hypertension Father, Mother.  Pregnancy / Birth History Erasmo Leventhal, RN, BSN; 01/02/2015 10:54 AM) Age at menarche 25 years. Age of menopause 44-50 Gravida 2 Irregular periods Maternal age 33-30 Para 2     Review of Systems Patty Buckley. Lavoris Canizales MD; 01/02/2015 12:38 PM) General Not Present- Appetite Loss, Chills, Fatigue, Fever, Night Sweats, Weight Gain and Weight Loss. Skin Present- New Lesions. Not Present- Change in Wart/Mole, Dryness, Hives, Jaundice,  Non-Healing Wounds, Rash and Ulcer. HEENT Present- Wears glasses/contact lenses. Not Present- Earache, Hearing Loss, Hoarseness, Nose Bleed, Oral Ulcers, Ringing in the Ears, Seasonal Allergies, Sinus Pain, Sore Throat, Visual Disturbances and Yellow Eyes. Respiratory Not Present- Bloody sputum, Chronic Cough, Difficulty Breathing, Snoring and Wheezing. Breast Present- Breast Mass, Breast Pain and Skin Changes. Not Present- Nipple Discharge. Cardiovascular Not Present- Chest Pain, Difficulty Breathing Lying Down, Leg Cramps, Palpitations, Rapid Heart Rate, Shortness of Breath and Swelling of Extremities. Gastrointestinal Not Present- Abdominal Pain, Bloating, Bloody Stool, Change in Bowel Habits, Chronic diarrhea, Constipation, Difficulty Swallowing, Excessive gas, Gets full quickly at meals, Hemorrhoids, Indigestion, Nausea, Rectal Pain and Vomiting. Female Genitourinary Not Present- Frequency, Nocturia, Painful Urination, Pelvic Pain and Urgency. Musculoskeletal Present- Back Pain and Joint Stiffness. Not Present- Joint Pain, Muscle Pain, Muscle Weakness and Swelling of Extremities. Neurological Not Present- Decreased Memory, Fainting, Headaches, Numbness, Seizures, Tingling, Tremor, Trouble walking and Weakness. Psychiatric Not Present- Anxiety, Bipolar, Change in Sleep Pattern, Depression, Fearful and Frequent crying. Endocrine Not Present- Cold Intolerance, Excessive Hunger, Hair Changes, Heat Intolerance, Hot flashes and New Diabetes. Hematology Not Present- Easy Bruising, Excessive bleeding, Gland problems, HIV and Persistent Infections.  Note: Menarche - age 83 First pregnancy - age 77 Breastfeed - yes Oral contraceptives - 49 years Menopause - age 89 Family history - negative for breast ca;   Vitals Occupational hygienist, BSN; 01/02/2015 10:59 AM) 01/02/2015 10:57 AM Weight: 164.8 lb Height: 63in Body Surface Area: 1.82 m Body Mass Index: 29.19 kg/m Temp.: 97.35F(Oral)   Pulse: 80 (Regular)  Resp.: 18 (Unlabored)  BP: 110/80 (Sitting, Left Arm, Standard)     Physical Exam Rodman Key K. Graycie Halley MD; 01/02/2015 12:44 PM)  The physical exam findings are as follows: Note:WDWN in NAD HEENT: EOMI, sclera anicteric Neck: No masses, no thyromegaly Lungs: CTA bilaterally; normal respiratory effort Breasts: Right breast - no palpable masses,  no lymphadenopathy; no nipple retraction or discharge Left breast - no palpable lymphadenopathy; no nipple retraction or discharge; large 8 cm firm area involving the entire upper part of the breast, extending almost to the upper margin of the breast. In the center of this mass, there is a 1.5 cm area of erythema with firm tumor mass below the skin; no fluctuance noted. The remainder of the skin is normal in appearance. CV: Regular rate and rhythm; no murmurs Abd: +bowel sounds, soft, non-tender, no masses Ext: Well-perfused; no edema Skin: Warm, dry; no sign of jaundice    Assessment & Plan Rodman Key K. Octaviano Mukai MD; 01/02/2015 12:48 PM)  MALIGNANT NEOPLASM OF UPPER-INNER QUADRANT OF LEFT FEMALE BREAST (174.2  C50.212)  Current Plans Schedule for Surgery - right subclavian vein port placement. The surgical procedure has been discussed with the patient. Potential risks, benefits, alternative treatments, and expected outcomes have been explained. All of the patient's questions at this time have been answered. The likelihood of reaching the patient's treatment goal is good. The patient understand the proposed surgical procedure and wishes to proceed. Note:Large 6-7 cm invasive breast carcinoma - left upper breast.  Abnormal-appearing lymph nodes on ultrasound, but biopsy negative MRI pending. Appears to be some invasion of the skin with overlying erythema and tenderness  I discussed the situation with the patient. She will likely need neoadjuvant chemotherapy to try to shrink the tumor. She will likely still need mastectomy.  I discussed the risks and benefits of port placement with her.   We will refer her urgently to Oncology for neoadjuvant chemotherapy and will await her MRI results.   Patty Buckley. Georgette Dover, MD, [facility]  General/ Trauma Surgery  01/02/2015 12:51 PM

## 2015-01-11 NOTE — Interval H&P Note (Signed)
History and Physical Interval Note:  01/11/2015 7:48 AM  Patty Buckley  has presented today for surgery, with the diagnosis of Left Breast Cancer  The various methods of treatment have been discussed with the patient and family. After consideration of risks, benefits and other options for treatment, the patient has consented to  Procedure(s): INSERTION PORT-A-CATH (N/A) as a surgical intervention .  The patient's history has been reviewed, patient examined, no change in status, stable for surgery.  I have reviewed the patient's chart and labs.  Questions were answered to the patient's satisfaction.     Therin Vetsch K.

## 2015-01-12 ENCOUNTER — Encounter (HOSPITAL_BASED_OUTPATIENT_CLINIC_OR_DEPARTMENT_OTHER): Payer: Self-pay | Admitting: Surgery

## 2015-01-13 ENCOUNTER — Other Ambulatory Visit: Payer: Self-pay

## 2015-01-13 ENCOUNTER — Ambulatory Visit (HOSPITAL_COMMUNITY)
Admission: RE | Admit: 2015-01-13 | Discharge: 2015-01-13 | Disposition: A | Discharge status: Home / Self Care | Payer: Medicaid Other | Source: Ambulatory Visit | Attending: Hematology and Oncology | Admitting: Hematology and Oncology

## 2015-01-13 ENCOUNTER — Encounter: Payer: Self-pay | Admitting: *Deleted

## 2015-01-13 ENCOUNTER — Other Ambulatory Visit (INDEPENDENT_AMBULATORY_CARE_PROVIDER_SITE_OTHER): Payer: Self-pay | Admitting: Surgery

## 2015-01-13 ENCOUNTER — Ambulatory Visit (HOSPITAL_COMMUNITY)
Admit: 2015-01-13 | Discharge: 2015-01-13 | Disposition: A | Discharge status: Home / Self Care | Payer: Medicaid Other | Attending: Surgery | Admitting: Surgery

## 2015-01-13 ENCOUNTER — Other Ambulatory Visit: Payer: Self-pay | Admitting: Hematology and Oncology

## 2015-01-13 ENCOUNTER — Ambulatory Visit (HOSPITAL_COMMUNITY)
Admission: RE | Admit: 2015-01-13 | Discharge: 2015-01-13 | Disposition: A | Discharge status: Home / Self Care | Payer: Medicaid Other | Source: Ambulatory Visit | Attending: Surgery | Admitting: Surgery

## 2015-01-13 ENCOUNTER — Other Ambulatory Visit: Payer: Self-pay | Admitting: *Deleted

## 2015-01-13 DIAGNOSIS — Z539 Procedure and treatment not carried out, unspecified reason: Secondary | ICD-10-CM | POA: Diagnosis not present

## 2015-01-13 DIAGNOSIS — C50919 Malignant neoplasm of unspecified site of unspecified female breast: Secondary | ICD-10-CM

## 2015-01-13 DIAGNOSIS — C50912 Malignant neoplasm of unspecified site of left female breast: Secondary | ICD-10-CM

## 2015-01-13 DIAGNOSIS — I10 Essential (primary) hypertension: Secondary | ICD-10-CM | POA: Diagnosis not present

## 2015-01-13 DIAGNOSIS — E785 Hyperlipidemia, unspecified: Secondary | ICD-10-CM | POA: Insufficient documentation

## 2015-01-13 DIAGNOSIS — Z79899 Other long term (current) drug therapy: Secondary | ICD-10-CM | POA: Diagnosis not present

## 2015-01-13 DIAGNOSIS — C50212 Malignant neoplasm of upper-inner quadrant of left female breast: Secondary | ICD-10-CM

## 2015-01-13 DIAGNOSIS — M8580 Other specified disorders of bone density and structure, unspecified site: Secondary | ICD-10-CM | POA: Diagnosis not present

## 2015-01-13 DIAGNOSIS — K219 Gastro-esophageal reflux disease without esophagitis: Secondary | ICD-10-CM | POA: Diagnosis not present

## 2015-01-13 DIAGNOSIS — Z7982 Long term (current) use of aspirin: Secondary | ICD-10-CM | POA: Diagnosis not present

## 2015-01-13 DIAGNOSIS — Z452 Encounter for adjustment and management of vascular access device: Secondary | ICD-10-CM | POA: Diagnosis present

## 2015-01-13 DIAGNOSIS — E559 Vitamin D deficiency, unspecified: Secondary | ICD-10-CM | POA: Diagnosis not present

## 2015-01-13 DIAGNOSIS — Z01818 Encounter for other preprocedural examination: Secondary | ICD-10-CM

## 2015-01-13 DIAGNOSIS — Z7952 Long term (current) use of systemic steroids: Secondary | ICD-10-CM | POA: Diagnosis not present

## 2015-01-13 LAB — BASIC METABOLIC PANEL
ANION GAP: 10 (ref 5–15)
BUN: 11 mg/dL (ref 6–23)
CHLORIDE: 108 mmol/L (ref 96–112)
CO2: 24 mmol/L (ref 19–32)
CREATININE: 0.77 mg/dL (ref 0.50–1.10)
Calcium: 9.2 mg/dL (ref 8.4–10.5)
GFR, EST NON AFRICAN AMERICAN: 87 mL/min — AB (ref >90)
Glucose, Bld: 94 mg/dL (ref 70–99)
Potassium: 4 mmol/L (ref 3.5–5.1)
SODIUM: 142 mmol/L (ref 135–145)

## 2015-01-13 LAB — CBC
HCT: 44.3 % (ref 36.0–46.0)
HEMOGLOBIN: 14.4 g/dL (ref 12.0–15.0)
MCH: 32.5 pg (ref 26.0–34.0)
MCHC: 32.5 g/dL (ref 30.0–36.0)
MCV: 100 fL (ref 78.0–100.0)
PLATELETS: 297 10*3/uL (ref 150–400)
RBC: 4.43 MIL/uL (ref 3.87–5.11)
RDW: 13.2 % (ref 11.5–15.5)
WBC: 12.1 10*3/uL — AB (ref 4.0–10.5)

## 2015-01-13 LAB — APTT: APTT: 26 s (ref 24–37)

## 2015-01-13 LAB — PROTIME-INR
INR: 0.94 (ref 0.00–1.49)
PROTHROMBIN TIME: 12.7 s (ref 11.6–15.2)

## 2015-01-13 MED ORDER — MIDAZOLAM HCL 2 MG/2ML IJ SOLN
INTRAMUSCULAR | Status: AC
  Filled 2015-01-13: qty 4

## 2015-01-13 MED ORDER — CEFAZOLIN SODIUM-DEXTROSE 2-3 GM-% IV SOLR
INTRAVENOUS | Status: AC
  Filled 2015-01-13: qty 50

## 2015-01-13 MED ORDER — FENTANYL CITRATE 0.05 MG/ML IJ SOLN
INTRAMUSCULAR | Status: AC
  Filled 2015-01-13: qty 4

## 2015-01-13 MED ORDER — SODIUM CHLORIDE 0.9 % IV SOLN
Freq: Once | INTRAVENOUS | Status: AC
  Administered 2015-01-13: 13:00:00 via INTRAVENOUS

## 2015-01-13 MED ORDER — CEFAZOLIN SODIUM-DEXTROSE 2-3 GM-% IV SOLR: 2.0000 g | Freq: Once | INTRAVENOUS | Status: DC

## 2015-01-13 NOTE — Progress Notes (Signed)
*  PRELIMINARY RESULTS* Echocardiogram 2D Echocardiogram has been performed.  Patty Buckley 01/13/2015, 12:23 PM

## 2015-01-13 NOTE — H&P (Signed)
Chief Complaint: "I'm here to have a port a cath placed"  Referring Physician(s): Tsuei,Matthew  History of Present Illness: Patty Buckley is a 64 y.o. female with history of recently diagnosed left breast carcinoma and unsuccessful attempt at surgically placed right subclavian port secondary to possible stenosis/occlusion. She presents today for IJ directed port a cath placement.   Past Medical History  Diagnosis Date  . Hyperlipidemia   . Hypertension   . Left ventricular outflow obstruction   . Cigarette smoker   . Vitamin D deficiency   . Osteopenia   . GERD (gastroesophageal reflux disease)   . Hemorrhoids   . Hernia   . Ventricular hypertrophy   . Wears glasses   . Anxiety     Past Surgical History  Procedure Laterality Date  . Tonsillectomy    . Tubal ligation    . US echocardiography  04/24/2007    EF 55-60%  . Cardiovascular stress test  05/07/2007    EF 82% NO ISCHEMIA  . Wisdom tooth extraction    . Portacath placement Right 01/11/2015    Procedure: INSERTION PORT-A-CATH;  Surgeon: Donnie Mesa, MD;  Location: [facility];  Service: General;  Laterality: Right;     Attempted  failed to implant,     Allergies: Codeine  Medications: Prior to Admission medications   Medication Sig Start Date End Date Taking? Authorizing Provider  acetaminophen (TYLENOL) 325 MG tablet Take 650 mg by mouth every 6 (six) hours as needed (Pain).    Yes Historical Provider, MD  dexamethasone (DECADRON) 4 MG tablet Take 2 tablets by mouth once a day on the day after chemotherapy and then take 2 tablets two times a day for 2 days. Take with food. 01/09/15  Yes Rulon Eisenmenger, MD  HYDROcodone-acetaminophen (NORCO/VICODIN) 5-325 MG per tablet Take 1-2 tablets by mouth every 4 (four) hours as needed for moderate pain. 01/11/15  Yes Donnie Mesa, MD  lidocaine-prilocaine (EMLA) cream Apply to affected area once 01/09/15  Yes Rulon Eisenmenger, MD  lisinopril  (PRINIVIL,ZESTRIL) 20 MG tablet TAKE ONE TABLET BY MOUTH ONCE DAILY 11/01/14  Yes Thayer Headings, MD  LORazepam (ATIVAN) 0.5 MG tablet Take 1 tablet (0.5 mg total) by mouth every 6 (six) hours as needed (Nausea or vomiting). 01/09/15  Yes Rulon Eisenmenger, MD  metoprolol (LOPRESSOR) 50 MG tablet TAKE ONE TABLET BY MOUTH TWICE DAILY 11/21/14  Yes Thayer Headings, MD  Multiple Vitamin (MULTIVITAMIN) tablet Take 1 tablet by mouth. occasionally   Yes Historical Provider, MD  ondansetron (ZOFRAN) 8 MG tablet Take 1 tablet (8 mg total) by mouth 2 (two) times daily as needed. Start on the third day after chemotherapy. 01/09/15  Yes Rulon Eisenmenger, MD  prochlorperazine (COMPAZINE) 10 MG tablet Take 1 tablet (10 mg total) by mouth every 6 (six) hours as needed (Nausea or vomiting). 01/09/15  Yes Rulon Eisenmenger, MD  aspirin 81 MG tablet Take 81 mg by mouth daily.      Historical Provider, MD    Family History  Problem Relation Age of Onset  . Hypertension Mother   . Heart attack Mother   . Heart failure Father   . Hypertension Father     History   Social History  . Marital Status: Divorced    Spouse Name: N/A    Number of Children: N/A  . Years of Education: N/A   Social History Main Topics  . Smoking status: Current Every Day  Smoker -- 1.00 packs/day  . Smokeless tobacco: Not on file  . Alcohol Use: Yes     Comment: wine occassionally  . Drug Use: No  . Sexual Activity: Yes    Birth Control/ Protection: Surgical   Other Topics Concern  . Not on file   Social History Narrative      Review of Systems  Constitutional: Negative for fever and chills.  Respiratory: Negative for cough and shortness of breath.   Cardiovascular: Negative for chest pain.  Gastrointestinal: Negative for nausea, vomiting, abdominal pain and blood in stool.  Genitourinary: Negative for dysuria and hematuria.  Musculoskeletal: Negative for back pain.  Neurological: Negative for headaches.  Hematological: Does not  bruise/bleed easily.    Vital Signs: BP 119/57 mmHg  Pulse 53  Temp(Src) 98.3 F (36.8 C) (Oral)  Resp 20  SpO2 98%  Physical Exam  Constitutional: She is oriented to person, place, and time. She appears well-developed and well-nourished.  Cardiovascular: Regular rhythm.   Sl bradycardic  Pulmonary/Chest: Effort normal and breath sounds normal.  Abdominal: Soft. Bowel sounds are normal. There is no tenderness.  Musculoskeletal: Normal range of motion. She exhibits no edema.  Neurological: She is alert and oriented to person, place, and time.    Imaging: Mr Breast Bilateral W Wo Contrast  01/06/2015   CLINICAL DATA:  Invasive mammary carcinoma superior left breast for staging  LABS:  BUN and creatinine were obtained on site at [facility] at  [address]  Results:  BUN 5 mg/dL,  Creatinine 0.8 mg/dL.  EXAM: BILATERAL BREAST MRI WITH AND WITHOUT CONTRAST  TECHNIQUE: Multiplanar, multisequence MR images of both breasts were obtained prior to and following the intravenous administration of 64m of MultiHance.  THREE-DIMENSIONAL MR IMAGE RENDERING ON INDEPENDENT WORKSTATION:  Three-dimensional MR images were rendered by post-processing of the original MR data on an independent workstation. The three-dimensional MR images were interpreted, and findings are reported in the following complete MRI report for this study. Three dimensional images were evaluated at the independent DynaCad workstation  COMPARISON:  Previous exams  FINDINGS: Breast composition: b.  Scattered fibroglandular tissue.  Background parenchymal enhancement: Mild  Right breast: No mass or abnormal enhancement.  Left breast: There is a large enhancing mass in the 10:00 to 12:00 position of the left breast. It demonstrates rapid enhancement with a mixture of plateau, progressive, and washout kinetics. There is evidence of internal necrosis. The mass extends from the posterior through the middle third of the left  breast. Near the superior most aspect of the left breast, the mass extends essentially from chest wall to the dermis, causing convexity of the overlying dermis. Although enhancement closely approaches the pectoralis fascia, there is no evidence of pectoralis fascia enhancement to suggest chest wall involvement.  There is thickening and enhancement of the dermis of the left breast superiorly and medially, as well as of the dermis overlying the nipple areolar complex. Toward the inferior aspect of the mass, there is a linear focus of enhancement extending from the anterior edge of the mass all the way to the nipple over a distance of 4.5 cm.  Lymph nodes: There are several bilateral axillary lymph nodes. The 2 most prominent are located in the mid axilla on the left. The more medial of the 2 measures 11 mm and shows no normal hilum. Superior and lateral to this is a 28 mm lymph node with the cortex measuring up to 10 mm.  Ancillary findings:  None.  IMPRESSION: Large mass in the 10 o'clock through 12 o'clock position of the left breast consistent with the diagnosis of invasive carcinoma. There is left breast skin thickening and enhancement involving the superior and medial left breast as well as the nipple areolar complex, and involvement of the skin with malignancy is not excluded. Additionally, there is non mass like linear enhancement extending from the anterior inferior aspect of the mass all the way into the nipple. There are 2 left axillary lymph nodes which demonstrate an appearance suggesting the possibility of metastasis.  RECOMMENDATION: Proceed with appropriate treatment planning. The possibility of dermal involvement with malignancy including the nipple is not excluded. Possibilities for further evaluation would include skin punch biopsy. MRI guided biopsy of linear non mass like enhancement extending from the anterior inferior aspect of the mass toward the nipple could also be considered.  BI-RADS  CATEGORY  6: Known biopsy-proven malignancy.   Electronically Signed   By: Skipper Cliche M.D.   On: 01/06/2015 15:41   Dg Chest Port 1 View  01/11/2015   CLINICAL DATA:  Attempted right central line placement, history of breast carcinoma  EXAM: PORTABLE CHEST - 1 VIEW  COMPARISON:  CT chest of 12/15/2007  FINDINGS: No pneumothorax is seen. The lungs are clear with only minimal basilar volume loss present. Mediastinal and hilar contours are unremarkable. The heart is within normal limits in size.  IMPRESSION: No active lung disease.  No pneumothorax.   Electronically Signed   By: Ivar Drape M.D.   On: 01/11/2015 10:05   Mm Digital Diagnostic Bilat  12/15/2014   CLINICAL DATA:  Patient presents for evaluation of palpable left breast abnormality.  EXAM: DIGITAL DIAGNOSTIC  BILATERAL MAMMOGRAM WITH CAD  ULTRASOUND LEFT BREAST  COMPARISON:  [facility]  ACR Breast Density Category b: There are scattered areas of fibroglandular density.  FINDINGS: Underlying the palpable marker within the upper left breast is a 5.6 cm irregular mass. No additional concerning masses, calcifications or areas of architectural distortion identified within either breast.  Mammographic images were processed with CAD.  On physical exam, I palpate a firm mass within the superior aspect of the left breast.  Ultrasound is performed, showing a 4.7 x 2.8 x 4.7 cm irregular solid mass within the left breast 1230 o'clock 4 cm from the nipple with internal color vascularity. There is a cortically thickened 1.4 cm left axillary lymph node.  IMPRESSION: Suspicious left breast mass.  Cortically thickened left axillary lymph node.  RECOMMENDATION: Ultrasound-guided core needle biopsy left breast mass and left axillary lymph node.  Patient will be referred to [facility].  I have discussed the findings and recommendations with the patient. Results were also provided in writing at the conclusion of the visit. If applicable, a reminder letter will be sent to the  patient regarding the next appointment.  BI-RADS CATEGORY  4: Suspicious.   Electronically Signed   By: Lovey Newcomer M.D.   On: 12/15/2014 13:06   Mm Digital Diagnostic Unilat L  12/28/2014   ADDENDUM REPORT: 12/28/2014 14:00  ADDENDUM: I spoke with the patient on 12/28/2014 to relay pathology results from recent ultrasound-guided core needle biopsy left breast. Pathology results demonstrate left breast invasive mammary carcinoma. The lymph node was negative for carcinoma. Pathology results are concordant with imaging findings. Patient has an appointment with Dr. Georgette Dover on 01/02/2015 at 10:45 a.m. Additionally she will be contacted with a preoperative MRI appointment. Patient reports no complaints at the biopsy site. She is instructed to  call the breast center with any additional concerns or questions.   Electronically Signed   By: Lovey Newcomer M.D.   On: 12/28/2014 14:00   12/28/2014   CLINICAL DATA:  Ultrasound-guided core needle biopsy of a large mass in the upper-inner quadrant of the left breast earlier today. Confirmation of clip placement.  EXAM: DIAGNOSTIC LEFT MAMMOGRAM POST ULTRASOUND BIOPSY  COMPARISON:  Previous exams.  FINDINGS: Mammographic images were obtained following ultrasound guided biopsy of a large mass in the upper-inner quadrant of the left breast. The ribbon shaped tissue marker clip is not visible in the mass or elsewhere in the left breast, indicating that it did not deploy. Gas bubbles are present within the anterior portion of the mass related to the biopsy, indicating the biopsy samples were indeed obtained from the mass. Expected post biopsy changes are present without evidence of hematoma.  IMPRESSION: The ribbon shaped tissue marker clip did not deploy and is not present within the mass or elsewhere in the left breast. Due to the size of the mass, I elected not to go back and place a clip, as subsequent localization will not be hindered in the absence of the clip.  Final Assessment:  Post Procedure Mammograms for Marker Placement  Electronically Signed: By: Evangeline Dakin M.D. On: 12/27/2014 15:40   US Breast Ltd Uni Left Inc Axilla  12/15/2014   CLINICAL DATA:  Patient presents for evaluation of palpable left breast abnormality.  EXAM: DIGITAL DIAGNOSTIC  BILATERAL MAMMOGRAM WITH CAD  ULTRASOUND LEFT BREAST  COMPARISON:  [facility]  ACR Breast Density Category b: There are scattered areas of fibroglandular density.  FINDINGS: Underlying the palpable marker within the upper left breast is a 5.6 cm irregular mass. No additional concerning masses, calcifications or areas of architectural distortion identified within either breast.  Mammographic images were processed with CAD.  On physical exam, I palpate a firm mass within the superior aspect of the left breast.  Ultrasound is performed, showing a 4.7 x 2.8 x 4.7 cm irregular solid mass within the left breast 1230 o'clock 4 cm from the nipple with internal color vascularity. There is a cortically thickened 1.4 cm left axillary lymph node.  IMPRESSION: Suspicious left breast mass.  Cortically thickened left axillary lymph node.  RECOMMENDATION: Ultrasound-guided core needle biopsy left breast mass and left axillary lymph node.  Patient will be referred to [facility].  I have discussed the findings and recommendations with the patient. Results were also provided in writing at the conclusion of the visit. If applicable, a reminder letter will be sent to the patient regarding the next appointment.  BI-RADS CATEGORY  4: Suspicious.   Electronically Signed   By: Lovey Newcomer M.D.   On: 12/15/2014 13:06   [place] Lt Breast Bx W Loc Dev 1st Lesion Img Bx Spec US Guide  12/27/2014   CLINICAL DATA:  Approximate 6 cm suspicious mass in the upper inner quadrant of the left breast, initially noted by the patient approximately 3 months ago, with rapid interval growth. Abnormal left axillary lymph nodes demonstrating cortical thickening on diagnostic imaging.  EXAM:  ULTRASOUND GUIDED LEFT BREAST CORE NEEDLE BIOPSY  COMPARISON:  Previous exams.  FINDINGS: I met with the patient and we discussed the procedure of ultrasound-guided biopsy, including benefits and alternatives. We discussed the high likelihood of a successful procedure. We discussed the risks of the procedure, including infection, bleeding, tissue injury, clip migration, and inadequate sampling. Informed written consent was given. The usual time-out protocol  was performed immediately prior to the procedure.  Using sterile technique and 2% Lidocaine as local anesthetic, under direct ultrasound visualization, a 14 gauge Marquee core needle device was used to perform biopsy of the large mass in the upper inner quadrant of the left breast using a medial approach. At the conclusion of the procedure a ribbon shaped tissue marker clip was deployed into the biopsy cavity. Follow up 2 view mammogram was performed and dictated separately.  IMPRESSION: Ultrasound guided biopsy of an approximate 6 cm suspicious mass in the upper-inner quadrant of the left breast. No apparent complications.   Electronically Signed   By: Evangeline Dakin M.D.   On: 12/27/2014 15:41   [place] Lt Breast Bx W Loc Dev Ea Add Lesion Img Bx Spec US Guide  12/27/2014   CLINICAL DATA:  Approximate 6 cm suspicious mass in the upper inner quadrant of the left breast, initially noted by the patient approximately 3 months ago, with rapid interval growth. Abnormal left axillary lymph nodes demonstrating cortical thickening on diagnostic imaging.  EXAM: ULTRASOUND GUIDED CORE NEEDLE BIOPSY OF A LEFT AXILLARY NODE  COMPARISON:  Previous exams.  FINDINGS: I met with the patient and we discussed the procedure of ultrasound-guided biopsy, including benefits and alternatives. We discussed the high likelihood of a successful procedure. We discussed the risks of the procedure, including infection, bleeding, tissue injury, clip migration, and inadequate sampling.  Informed written consent was given. The usual time-out protocol was performed immediately prior to the procedure.  Using sterile technique and 2% Lidocaine as local anesthetic, under direct ultrasound visualization, a 14 gauge [place] spring-loaded core needle device was used to perform biopsy of an abnormal left axillary node though with cortical thickening using an inferior approach. There were no immediate complications.  IMPRESSION: Ultrasound guided biopsy of an abnormal left axillary lymph node with cortical thickening. No apparent complications.   Electronically Signed   By: Evangeline Dakin M.D.   On: 12/27/2014 15:41    Labs:  CBC:  Recent Labs  01/09/15 1040 01/13/15 1242  WBC 10.0 12.1*  HGB 15.2 14.4  HCT 46.8* 44.3  PLT 285 297    COAGS:  Recent Labs  01/13/15 1242  INR 0.94  APTT 26    BMP:  Recent Labs  01/09/15 1040  NA 144  K 4.8  CO2 27  GLUCOSE 90  BUN 8.0  CALCIUM 9.2  CREATININE 0.8    LIVER FUNCTION TESTS:  Recent Labs  01/09/15 1040  BILITOT 0.54  AST 15  ALT 15  ALKPHOS 81  PROT 6.7  ALBUMIN 3.4*    TUMOR MARKERS: No results for input(s): AFPTM, CEA, CA199, CHROMGRNA in the last 8760 hours.  Assessment/Plan:  Patty Buckley is a 64 y.o. female with history of recently diagnosed left breast carcinoma and unsuccessful attempt at surgically placed right subclavian port secondary to possible stenosis/occlusion. She presents today for IJ directed port a cath placement. Details/risks of procedure d/w pt/sister with their understanding and consent.    Signed: Autumn Messing 01/13/2015, 1:29 PM

## 2015-01-16 ENCOUNTER — Other Ambulatory Visit: Payer: Self-pay | Admitting: Hematology and Oncology

## 2015-01-16 ENCOUNTER — Other Ambulatory Visit (HOSPITAL_COMMUNITY): Payer: Self-pay

## 2015-01-16 ENCOUNTER — Encounter (HOSPITAL_COMMUNITY): Payer: Self-pay

## 2015-01-16 ENCOUNTER — Ambulatory Visit (HOSPITAL_COMMUNITY)
Admission: RE | Admit: 2015-01-16 | Discharge: 2015-01-16 | Disposition: A | Discharge status: Home / Self Care | Payer: Medicaid Other | Source: Ambulatory Visit | Attending: Hematology and Oncology | Admitting: Hematology and Oncology

## 2015-01-16 ENCOUNTER — Ambulatory Visit (HOSPITAL_COMMUNITY): Payer: Self-pay

## 2015-01-16 ENCOUNTER — Encounter (HOSPITAL_COMMUNITY)
Admission: RE | Admit: 2015-01-16 | Discharge: 2015-01-16 | Disposition: A | Discharge status: Home / Self Care | Payer: Medicaid Other | Source: Ambulatory Visit | Attending: Hematology and Oncology | Admitting: Hematology and Oncology

## 2015-01-16 DIAGNOSIS — C50212 Malignant neoplasm of upper-inner quadrant of left female breast: Secondary | ICD-10-CM

## 2015-01-16 DIAGNOSIS — C50912 Malignant neoplasm of unspecified site of left female breast: Secondary | ICD-10-CM

## 2015-01-16 MED ORDER — LIDOCAINE HCL 1 % IJ SOLN
INTRAMUSCULAR | Status: AC
  Filled 2015-01-16: qty 20

## 2015-01-16 MED ORDER — TECHNETIUM TC 99M MEDRONATE IV KIT
25.0000 | PACK | Freq: Once | INTRAVENOUS | Status: AC | PRN
Start: 2015-01-16 — End: 2015-01-16
  Administered 2015-01-16: 25 via INTRAVENOUS

## 2015-01-16 MED ORDER — IOHEXOL 300 MG/ML  SOLN
100.0000 mL | Freq: Once | INTRAMUSCULAR | Status: AC | PRN
  Administered 2015-01-16: 100 mL via INTRAVENOUS

## 2015-01-16 MED ORDER — HEPARIN SOD (PORK) LOCK FLUSH 100 UNIT/ML IV SOLN
INTRAVENOUS | Status: AC
  Filled 2015-01-16: qty 5

## 2015-01-16 NOTE — Procedures (Signed)
RUE PICC 39 cm SVC RA No comp

## 2015-01-17 ENCOUNTER — Telehealth: Payer: Self-pay | Admitting: Hematology and Oncology

## 2015-01-17 ENCOUNTER — Ambulatory Visit (HOSPITAL_BASED_OUTPATIENT_CLINIC_OR_DEPARTMENT_OTHER): Payer: Medicaid Other | Admitting: Hematology and Oncology

## 2015-01-17 ENCOUNTER — Other Ambulatory Visit: Payer: Self-pay | Admitting: *Deleted

## 2015-01-17 ENCOUNTER — Other Ambulatory Visit (HOSPITAL_BASED_OUTPATIENT_CLINIC_OR_DEPARTMENT_OTHER): Payer: Medicaid Other

## 2015-01-17 ENCOUNTER — Encounter: Payer: Self-pay | Admitting: *Deleted

## 2015-01-17 ENCOUNTER — Ambulatory Visit (HOSPITAL_BASED_OUTPATIENT_CLINIC_OR_DEPARTMENT_OTHER): Payer: Medicaid Other

## 2015-01-17 VITALS — BP 122/67 | HR 77 | Temp 98.5°F | Resp 18 | Ht 62.0 in | Wt 165.5 lb

## 2015-01-17 DIAGNOSIS — C50212 Malignant neoplasm of upper-inner quadrant of left female breast: Secondary | ICD-10-CM

## 2015-01-17 DIAGNOSIS — Z171 Estrogen receptor negative status [ER-]: Secondary | ICD-10-CM

## 2015-01-17 DIAGNOSIS — Z5189 Encounter for other specified aftercare: Secondary | ICD-10-CM

## 2015-01-17 DIAGNOSIS — Z5111 Encounter for antineoplastic chemotherapy: Secondary | ICD-10-CM

## 2015-01-17 LAB — COMPREHENSIVE METABOLIC PANEL (CC13)
ALBUMIN: 3.3 g/dL — AB (ref 3.5–5.0)
ALK PHOS: 79 U/L (ref 40–150)
ALT: 15 U/L (ref 0–55)
AST: 13 U/L (ref 5–34)
Anion Gap: 10 mEq/L (ref 3–11)
BUN: 8.2 mg/dL (ref 7.0–26.0)
CALCIUM: 9.5 mg/dL (ref 8.4–10.4)
CO2: 22 meq/L (ref 22–29)
Chloride: 105 mEq/L (ref 98–109)
Creatinine: 0.8 mg/dL (ref 0.6–1.1)
EGFR: 78 mL/min/{1.73_m2} — AB (ref >90)
GLUCOSE: 126 mg/dL (ref 70–140)
Potassium: 4.4 mEq/L (ref 3.5–5.1)
SODIUM: 137 meq/L (ref 136–145)
Total Bilirubin: 0.86 mg/dL (ref 0.20–1.20)
Total Protein: 6.4 g/dL (ref 6.4–8.3)

## 2015-01-17 LAB — CBC WITH DIFFERENTIAL/PLATELET
BASO%: 0.3 % (ref 0.0–2.0)
Basophils Absolute: 0 10*3/uL (ref 0.0–0.1)
EOS%: 1.9 % (ref 0.0–7.0)
Eosinophils Absolute: 0.2 10*3/uL (ref 0.0–0.5)
HCT: 46.3 % (ref 34.8–46.6)
HGB: 15.4 g/dL (ref 11.6–15.9)
LYMPH#: 2.8 10*3/uL (ref 0.9–3.3)
LYMPH%: 23.9 % (ref 14.0–49.7)
MCH: 32.8 pg (ref 25.1–34.0)
MCHC: 33.3 g/dL (ref 31.5–36.0)
MCV: 98.7 fL (ref 79.5–101.0)
MONO#: 0.9 10*3/uL (ref 0.1–0.9)
MONO%: 7.6 % (ref 0.0–14.0)
NEUT#: 7.8 10*3/uL — ABNORMAL HIGH (ref 1.5–6.5)
NEUT%: 66.3 % (ref 38.4–76.8)
Platelets: 282 10*3/uL (ref 145–400)
RBC: 4.69 10*6/uL (ref 3.70–5.45)
RDW: 13.3 % (ref 11.2–14.5)
WBC: 11.8 10*3/uL — ABNORMAL HIGH (ref 3.9–10.3)

## 2015-01-17 MED ORDER — PEGFILGRASTIM 6 MG/0.6ML ~~LOC~~ PSKT
6.0000 mg | PREFILLED_SYRINGE | Freq: Once | SUBCUTANEOUS | Status: AC
  Administered 2015-01-17: 6 mg via SUBCUTANEOUS
  Filled 2015-01-17: qty 0.6

## 2015-01-17 MED ORDER — DEXAMETHASONE SODIUM PHOSPHATE 20 MG/5ML IJ SOLN
INTRAMUSCULAR | Status: AC
  Filled 2015-01-17: qty 5

## 2015-01-17 MED ORDER — SODIUM CHLORIDE 0.9 % IV SOLN
Freq: Once | INTRAVENOUS | Status: AC
  Administered 2015-01-17: 09:00:00 via INTRAVENOUS

## 2015-01-17 MED ORDER — DOXORUBICIN HCL CHEMO IV INJECTION 2 MG/ML
60.0000 mg/m2 | Freq: Once | INTRAVENOUS | Status: AC
  Administered 2015-01-17: 110 mg via INTRAVENOUS
  Filled 2015-01-17: qty 55

## 2015-01-17 MED ORDER — PALONOSETRON HCL INJECTION 0.25 MG/5ML
0.2500 mg | Freq: Once | INTRAVENOUS | Status: AC
  Administered 2015-01-17: 0.25 mg via INTRAVENOUS

## 2015-01-17 MED ORDER — FOSAPREPITANT DIMEGLUMINE INJECTION 150 MG
150.0000 mg | Freq: Once | INTRAVENOUS | Status: AC
  Administered 2015-01-17: 150 mg via INTRAVENOUS
  Filled 2015-01-17: qty 5

## 2015-01-17 MED ORDER — SODIUM CHLORIDE 0.9 % IJ SOLN
10.0000 mL | INTRAMUSCULAR | Status: DC | PRN
  Administered 2015-01-17: 10 mL
  Filled 2015-01-17: qty 10

## 2015-01-17 MED ORDER — HEPARIN SOD (PORK) LOCK FLUSH 100 UNIT/ML IV SOLN
500.0000 [IU] | Freq: Once | INTRAVENOUS | Status: AC | PRN
  Administered 2015-01-17: 500 [IU]
  Filled 2015-01-17: qty 5

## 2015-01-17 MED ORDER — PALONOSETRON HCL INJECTION 0.25 MG/5ML
INTRAVENOUS | Status: AC
  Filled 2015-01-17: qty 5

## 2015-01-17 MED ORDER — DEXAMETHASONE SODIUM PHOSPHATE 20 MG/5ML IJ SOLN
12.0000 mg | Freq: Once | INTRAMUSCULAR | Status: AC
  Administered 2015-01-17: 12 mg via INTRAVENOUS

## 2015-01-17 MED ORDER — SODIUM CHLORIDE 0.9 % IV SOLN
600.0000 mg/m2 | Freq: Once | INTRAVENOUS | Status: AC
  Administered 2015-01-17: 1100 mg via INTRAVENOUS
  Filled 2015-01-17: qty 55

## 2015-01-17 NOTE — Patient Instructions (Addendum)
[facility] Discharge Instructions for Patients Receiving Chemotherapy  Today you received the following chemotherapy agents adriamycin/cytoxan  To help prevent nausea and vomiting after your treatment, we encourage you to take your nausea medication as directed   If you develop nausea and vomiting that is not controlled by your nausea medication, call the clinic.   BELOW ARE SYMPTOMS THAT SHOULD BE REPORTED IMMEDIATELY:  *FEVER GREATER THAN 100.5 F  *CHILLS WITH OR WITHOUT FEVER  NAUSEA AND VOMITING THAT IS NOT CONTROLLED WITH YOUR NAUSEA MEDICATION  *UNUSUAL SHORTNESS OF BREATH  *UNUSUAL BRUISING OR BLEEDING  TENDERNESS IN MOUTH AND THROAT WITH OR WITHOUT PRESENCE OF ULCERS  *URINARY PROBLEMS  *BOWEL PROBLEMS  UNUSUAL RASH Items with * indicate a potential emergency and should be followed up as soon as possible.  Feel free to call the clinic you have any questions or concerns. The clinic phone number is [phone].   Doxorubicin injection What is this medicine? DOXORUBICIN (dox oh ROO bi sin) is a chemotherapy drug. It is used to treat many kinds of cancer like Hodgkin's disease, leukemia, non-Hodgkin's lymphoma, neuroblastoma, sarcoma, and Wilms' tumor. It is also used to treat bladder cancer, breast cancer, lung cancer, ovarian cancer, stomach cancer, and thyroid cancer. This medicine may be used for other purposes; ask your health care provider or pharmacist if you have questions. COMMON BRAND NAME(S): Adriamycin, Adriamycin PFS, Adriamycin RDF, Rubex What should I tell my health care provider before I take this medicine? They need to know if you have any of these conditions: -blood disorders -heart disease, recent heart attack -infection (especially a virus infection such as chickenpox, cold sores, or herpes) -irregular heartbeat -liver disease -recent or ongoing radiation therapy -an unusual or allergic reaction to doxorubicin, other chemotherapy  agents, other medicines, foods, dyes, or preservatives -pregnant or trying to get pregnant -breast-feeding How should I use this medicine? This drug is given as an infusion into a vein. It is administered in a hospital or clinic by a specially trained health care professional. If you have pain, swelling, burning or any unusual feeling around the site of your injection, tell your health care professional right away. Talk to your pediatrician regarding the use of this medicine in children. Special care may be needed. Overdosage: If you think you have taken too much of this medicine contact a poison control center or emergency room at once. NOTE: This medicine is only for you. Do not share this medicine with others. What if I miss a dose? It is important not to miss your dose. Call your doctor or health care professional if you are unable to keep an appointment. What may interact with this medicine? Do not take this medicine with any of the following medications: -cisapride -droperidol -halofantrine -pimozide -zidovudine This medicine may also interact with the following medications: -chloroquine -chlorpromazine -clarithromycin -cyclophosphamide -cyclosporine -erythromycin -medicines for depression, anxiety, or psychotic disturbances -medicines for irregular heart beat like amiodarone, bepridil, dofetilide, encainide, flecainide, propafenone, quinidine -medicines for seizures like ethotoin, fosphenytoin, phenytoin -medicines for nausea, vomiting like dolasetron, ondansetron, palonosetron -medicines to increase blood counts like filgrastim, pegfilgrastim, sargramostim -methadone -methotrexate -pentamidine -progesterone -vaccines -verapamil Talk to your doctor or health care professional before taking any of these medicines: -acetaminophen -aspirin -ibuprofen -ketoprofen -naproxen This list may not describe all possible interactions. Give your health care provider a list of all  the medicines, herbs, non-prescription drugs, or dietary supplements you use. Also tell them if you smoke, drink alcohol, or use   illegal drugs. Some items may interact with your medicine. What should I watch for while using this medicine? Your condition will be monitored carefully while you are receiving this medicine. You will need important blood work done while you are taking this medicine. This drug may make you feel generally unwell. This is not uncommon, as chemotherapy can affect healthy cells as well as cancer cells. Report any side effects. Continue your course of treatment even though you feel ill unless your doctor tells you to stop. Your urine may turn red for a few days after your dose. This is not blood. If your urine is dark or brown, call your doctor. In some cases, you may be given additional medicines to help with side effects. Follow all directions for their use. Call your doctor or health care professional for advice if you get a fever, chills or sore throat, or other symptoms of a cold or flu. Do not treat yourself. This drug decreases your body's ability to fight infections. Try to avoid being around people who are sick. This medicine may increase your risk to bruise or bleed. Call your doctor or health care professional if you notice any unusual bleeding. Be careful brushing and flossing your teeth or using a toothpick because you may get an infection or bleed more easily. If you have any dental work done, tell your dentist you are receiving this medicine. Avoid taking products that contain aspirin, acetaminophen, ibuprofen, naproxen, or ketoprofen unless instructed by your doctor. These medicines may hide a fever. Men and women of childbearing age should use effective birth control methods while using taking this medicine. Do not become pregnant while taking this medicine. There is a potential for serious side effects to an unborn child. Talk to your health care professional or  pharmacist for more information. Do not breast-feed an infant while taking this medicine. Do not let others touch your urine or other body fluids for 5 days after each treatment with this medicine. Caregivers should wear latex gloves to avoid touching body fluids during this time. There is a maximum amount of this medicine you should receive throughout your life. The amount depends on the medical condition being treated and your overall health. Your doctor will watch how much of this medicine you receive in your lifetime. Tell your doctor if you have taken this medicine before. What side effects may I notice from receiving this medicine? Side effects that you should report to your doctor or health care professional as soon as possible: -allergic reactions like skin rash, itching or hives, swelling of the face, lips, or tongue -low blood counts - this medicine may decrease the number of white blood cells, red blood cells and platelets. You may be at increased risk for infections and bleeding. -signs of infection - fever or chills, cough, sore throat, pain or difficulty passing urine -signs of decreased platelets or bleeding - bruising, pinpoint red spots on the skin, black, tarry stools, blood in the urine -signs of decreased red blood cells - unusually weak or tired, fainting spells, lightheadedness -breathing problems -chest pain -fast, irregular heartbeat -mouth sores -nausea, vomiting -pain, swelling, redness at site where injected -pain, tingling, numbness in the hands or feet -swelling of ankles, feet, or hands -unusual bleeding or bruising Side effects that usually do not require medical attention (report to your doctor or health care professional if they continue or are bothersome): -diarrhea -facial flushing -hair loss -loss of appetite -missed menstrual periods -nail discoloration or damage -red   or watery eyes -red colored urine -stomach upset This list may not describe all  possible side effects. Call your doctor for medical advice about side effects. You may report side effects to FDA at 1-800-FDA-1088. Where should I keep my medicine? This drug is given in a hospital or clinic and will not be stored at home. NOTE: This sheet is a summary. It may not cover all possible information. If you have questions about this medicine, talk to your doctor, pharmacist, or health care provider.  2015, [employer] Standard. (2013-03-23 09:54:34)  Cyclophosphamide injection What is this medicine? CYCLOPHOSPHAMIDE (sye kloe FOSS fa mide) is a chemotherapy drug. It slows the growth of cancer cells. This medicine is used to treat many types of cancer like lymphoma, myeloma, leukemia, breast cancer, and ovarian cancer, to name a few. This medicine may be used for other purposes; ask your health care provider or pharmacist if you have questions. COMMON BRAND NAME(S): Cytoxan, Neosar What should I tell my health care provider before I take this medicine? They need to know if you have any of these conditions: -blood disorders -history of other chemotherapy -infection -kidney disease -liver disease -recent or ongoing radiation therapy -tumors in the bone marrow -an unusual or allergic reaction to cyclophosphamide, other chemotherapy, other medicines, foods, dyes, or preservatives -pregnant or trying to get pregnant -breast-feeding How should I use this medicine? This drug is usually given as an injection into a vein or muscle or by infusion into a vein. It is administered in a hospital or clinic by a specially trained health care professional. Talk to your pediatrician regarding the use of this medicine in children. Special care may be needed. Overdosage: If you think you have taken too much of this medicine contact a poison control center or emergency room at once. NOTE: This medicine is only for you. Do not share this medicine with others. What if I miss a dose? It is  important not to miss your dose. Call your doctor or health care professional if you are unable to keep an appointment. What may interact with this medicine? This medicine may interact with the following medications: -amiodarone -amphotericin B -azathioprine -certain antiviral medicines for HIV or AIDS such as protease inhibitors (e.g., indinavir, ritonavir) and zidovudine -certain blood pressure medications such as benazepril, captopril, enalapril, fosinopril, lisinopril, moexipril, monopril, perindopril, quinapril, ramipril, trandolapril -certain cancer medications such as anthracyclines (e.g., daunorubicin, doxorubicin), busulfan, cytarabine, paclitaxel, pentostatin, tamoxifen, trastuzumab -certain diuretics such as chlorothiazide, chlorthalidone, hydrochlorothiazide, indapamide, metolazone -certain medicines that treat or prevent blood clots like warfarin -certain muscle relaxants such as succinylcholine -cyclosporine -etanercept -indomethacin -medicines to increase blood counts like filgrastim, pegfilgrastim, sargramostim -medicines used as general anesthesia -metronidazole -natalizumab This list may not describe all possible interactions. Give your health care provider a list of all the medicines, herbs, non-prescription drugs, or dietary supplements you use. Also tell them if you smoke, drink alcohol, or use illegal drugs. Some items may interact with your medicine. What should I watch for while using this medicine? Visit your doctor for checks on your progress. This drug may make you feel generally unwell. This is not uncommon, as chemotherapy can affect healthy cells as well as cancer cells. Report any side effects. Continue your course of treatment even though you feel ill unless your doctor tells you to stop. Drink water or other fluids as directed. Urinate often, even at night. In some cases, you may be given additional medicines to help with side effects. Follow all directions   for  their use. Call your doctor or health care professional for advice if you get a fever, chills or sore throat, or other symptoms of a cold or flu. Do not treat yourself. This drug decreases your body's ability to fight infections. Try to avoid being around people who are sick. This medicine may increase your risk to bruise or bleed. Call your doctor or health care professional if you notice any unusual bleeding. Be careful brushing and flossing your teeth or using a toothpick because you may get an infection or bleed more easily. If you have any dental work done, tell your dentist you are receiving this medicine. You may get drowsy or dizzy. Do not drive, use machinery, or do anything that needs mental alertness until you know how this medicine affects you. Do not become pregnant while taking this medicine or for 1 year after stopping it. Women should inform their doctor if they wish to become pregnant or think they might be pregnant. Men should not father a child while taking this medicine and for 4 months after stopping it. There is a potential for serious side effects to an unborn child. Talk to your health care professional or pharmacist for more information. Do not breast-feed an infant while taking this medicine. This medicine may interfere with the ability to have a child. This medicine has caused ovarian failure in some women. This medicine has caused reduced sperm counts in some men. You should talk with your doctor or health care professional if you are concerned about your fertility. If you are going to have surgery, tell your doctor or health care professional that you have taken this medicine. What side effects may I notice from receiving this medicine? Side effects that you should report to your doctor or health care professional as soon as possible: -allergic reactions like skin rash, itching or hives, swelling of the face, lips, or tongue -low blood counts - this medicine may decrease the  number of white blood cells, red blood cells and platelets. You may be at increased risk for infections and bleeding. -signs of infection - fever or chills, cough, sore throat, pain or difficulty passing urine -signs of decreased platelets or bleeding - bruising, pinpoint red spots on the skin, black, tarry stools, blood in the urine -signs of decreased red blood cells - unusually weak or tired, fainting spells, lightheadedness -breathing problems -dark urine -dizziness -palpitations -swelling of the ankles, feet, hands -trouble passing urine or change in the amount of urine -weight gain -yellowing of the eyes or skin Side effects that usually do not require medical attention (report to your doctor or health care professional if they continue or are bothersome): -changes in nail or skin color -hair loss -missed menstrual periods -mouth sores -nausea, vomiting This list may not describe all possible side effects. Call your doctor for medical advice about side effects. You may report side effects to FDA at 1-800-FDA-1088. Where should I keep my medicine? This drug is given in a hospital or clinic and will not be stored at home. NOTE: This sheet is a summary. It may not cover all possible information. If you have questions about this medicine, talk to your doctor, pharmacist, or health care provider.  2015, [employer] Standard. (2012-10-09 16:22:58) [facility] Discharge Instructions for Patients Receiving Chemotherapy  Today you received the following chemotherapy agents Adriamycin and Cytoxan.  To help prevent nausea and vomiting after your treatment, we encourage you to take your nausea medication as prescribed.  If you develop nausea and vomiting that is not controlled by your nausea medication, call the clinic.   BELOW ARE SYMPTOMS THAT SHOULD BE REPORTED IMMEDIATELY:  *FEVER GREATER THAN 100.5 F  *CHILLS WITH OR WITHOUT FEVER  NAUSEA AND VOMITING THAT IS  NOT CONTROLLED WITH YOUR NAUSEA MEDICATION  *UNUSUAL SHORTNESS OF BREATH  *UNUSUAL BRUISING OR BLEEDING  TENDERNESS IN MOUTH AND THROAT WITH OR WITHOUT PRESENCE OF ULCERS  *URINARY PROBLEMS  *BOWEL PROBLEMS  UNUSUAL RASH Items with * indicate a potential emergency and should be followed up as soon as possible.  Feel free to call the clinic you have any questions or concerns. The clinic phone number is [phone].

## 2015-01-17 NOTE — Addendum Note (Signed)
Addended by: Prentiss Bells on: 01/17/2015 08:48 AM   Modules accepted: Medications

## 2015-01-17 NOTE — Assessment & Plan Note (Addendum)
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 CT chest abdomen pelvis and bone scan are negative for distant metastatic disease 2. Echocardiogram 01/13/2014 showed an EF of 60-65% 3. 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 4. Chemotherapy education has been complete and consent has been signed  Return to clinic in 1 week for nadir count check.

## 2015-01-17 NOTE — Progress Notes (Signed)
Patient Care Team: Antony Blackbird, MD as PCP - General (Family Medicine)  DIAGNOSIS: Breast cancer of upper-inner quadrant of left female breast   Staging form: Breast, AJCC 7th Edition     Clinical: Stage IIIB (T4b, N0, M0) - Signed by Rulon Eisenmenger, MD on 01/17/2015   SUMMARY OF ONCOLOGIC HISTORY:   Breast cancer of upper-inner quadrant of left female breast   12/27/2014 Initial Diagnosis Grade 3 invasive mammary cancer, one lymph node negative in left axilla, ER 0%, PR 0%, Ki-67 98%, HER-2 negative ratio 1.38   01/06/2015 Breast MRI Left breast large enhancing mass, evidence of necrosis, extending from posterior through the middle third of left breast, extending from chest wall to the dermis, close to pectoralis fascia extending 4.5 cm, several bilateral axillary lymph nodes   01/17/2015 -  Neo-Adjuvant Chemotherapy Neoadjuvant chemotherapy with dose dense Adriamycin and Cytoxan followed by Taxol and carboplatin    CHIEF COMPLIANT: cycle 1 dose dense Adriamycin Cytoxan  INTERVAL HISTORY: Patty Buckley is a 64 year old lady with above-mentioned history of left-sided large breast cancer extending to the skin with several bilateral axillary lymph nodes biopsy of the lymph node was negative who is here to start neoadjuvant chemotherapy with dose dense Adriamycin and Cytoxan today. She underwent CT chest abdomen pelvis and a bone scan which did not show any evidence of metastatic disease. She could not undergo port placement because of some vascular anomaly and she was sent to interventional radiology. Because of extensive bruising, radiology were not able to place a port and they put in a PICC line which would then be changed into a port next week.she is here today to start first cycle of chemotherapy. She is accompanied by her son who is a Biomedical scientist at [employer].  REVIEW OF SYSTEMS:   Constitutional: Denies fevers, chills or abnormal weight loss Eyes: Denies blurriness of vision Ears, nose, mouth,  throat, and face: Denies mucositis or sore throat Respiratory: Denies cough, dyspnea or wheezes Cardiovascular: Denies palpitation, chest discomfort or lower extremity swelling Gastrointestinal:  Denies nausea, heartburn or change in bowel habits Skin: Denies abnormal skin rashes Lymphatics: Denies new lymphadenopathy or easy bruising Neurological:Denies numbness, tingling or new weaknesses Behavioral/Psych: Mood is stable, no new changes  Breast:  Breast cancer extending to the skin All other systems were reviewed with the patient and are negative.  I have reviewed the past medical history, past surgical history, social history and family history with the patient and they are unchanged from previous note.  ALLERGIES:  is allergic to codeine.  MEDICATIONS:  Current Outpatient Prescriptions  Medication Sig Dispense Refill  . acetaminophen (TYLENOL) 325 MG tablet Take 650 mg by mouth every 6 (six) hours as needed (Pain).     Marland Kitchen aspirin 81 MG tablet Take 81 mg by mouth daily.      Marland Kitchen dexamethasone (DECADRON) 4 MG tablet Take 2 tablets by mouth once a day on the day after chemotherapy and then take 2 tablets two times a day for 2 days. Take with food. 30 tablet 1  . HYDROcodone-acetaminophen (NORCO/VICODIN) 5-325 MG per tablet Take 1-2 tablets by mouth every 4 (four) hours as needed for moderate pain. 30 tablet 0  . lidocaine-prilocaine (EMLA) cream Apply to affected area once 30 g 3  . lisinopril (PRINIVIL,ZESTRIL) 20 MG tablet TAKE ONE TABLET BY MOUTH ONCE DAILY 90 tablet 1  . LORazepam (ATIVAN) 0.5 MG tablet Take 1 tablet (0.5 mg total) by mouth every 6 (six) hours as  needed (Nausea or vomiting). 30 tablet 0  . metoprolol (LOPRESSOR) 50 MG tablet TAKE ONE TABLET BY MOUTH TWICE DAILY 180 tablet 3  . Multiple Vitamin (MULTIVITAMIN) tablet Take 1 tablet by mouth. occasionally    . ondansetron (ZOFRAN) 8 MG tablet Take 1 tablet (8 mg total) by mouth 2 (two) times daily as needed. Start on the  third day after chemotherapy. 30 tablet 1  . prochlorperazine (COMPAZINE) 10 MG tablet Take 1 tablet (10 mg total) by mouth every 6 (six) hours as needed (Nausea or vomiting). 30 tablet 1   No current facility-administered medications for this visit.    PHYSICAL EXAMINATION: ECOG PERFORMANCE STATUS: 1 - Symptomatic but completely ambulatory  Filed Vitals:   01/17/15 0806  BP: 122/67  Pulse: 77  Temp: 98.5 F (36.9 C)  Resp: 18   Filed Weights   01/17/15 0806  Weight: 165 lb 8 oz (75.07 kg)    GENERAL:alert, no distress and comfortable SKIN: skin color, texture, turgor are normal, no rashes or significant lesions EYES: normal, Conjunctiva are pink and non-injected, sclera clear OROPHARYNX:no exudate, no erythema and lips, buccal mucosa, and tongue normal  NECK: supple, thyroid normal size, non-tender, without nodularity LYMPH:  no palpable lymphadenopathy in the cervical, axillary or inguinal LUNGS: clear to auscultation and percussion with normal breathing effort HEART: regular rate & rhythm and no murmurs and no lower extremity edema ABDOMEN:abdomen soft, non-tender and normal bowel sounds Musculoskeletal:no cyanosis of digits and no clubbing  NEURO: alert & oriented x 3 with fluent speech, no focal motor/sensory deficits  LABORATORY DATA:  I have reviewed the data as listed   Chemistry      Component Value Date/Time   NA 137 01/17/2015 0752   NA 142 01/13/2015 1242   K 4.4 01/17/2015 0752   K 4.0 01/13/2015 1242   CL 108 01/13/2015 1242   CO2 22 01/17/2015 0752   CO2 24 01/13/2015 1242   BUN 8.2 01/17/2015 0752   BUN 11 01/13/2015 1242   CREATININE 0.8 01/17/2015 0752   CREATININE 0.77 01/13/2015 1242      Component Value Date/Time   CALCIUM 9.5 01/17/2015 0752   CALCIUM 9.2 01/13/2015 1242   ALKPHOS 79 01/17/2015 0752   ALKPHOS 79 01/14/2012 0838   AST 13 01/17/2015 0752   AST 50* 01/14/2012 0838   ALT 15 01/17/2015 0752   ALT 38* 01/14/2012 0838    BILITOT 0.86 01/17/2015 0752   BILITOT 1.1 01/14/2012 0838       Lab Results  Component Value Date   WBC 11.8* 01/17/2015   HGB 15.4 01/17/2015   HCT 46.3 01/17/2015   MCV 98.7 01/17/2015   PLT 282 01/17/2015   NEUTROABS 7.8* 01/17/2015     RADIOGRAPHIC STUDIES: I have personally reviewed the radiology reports and agreed with their findings. Ct Chest W Contrast  01/16/2015   CLINICAL DATA:  Newly diagnosed left upper inner quadrant breast carcinoma. Staging.  EXAM: CT CHEST, ABDOMEN, AND PELVIS WITH CONTRAST  TECHNIQUE: Multidetector CT imaging of the chest, abdomen and pelvis was performed following the standard protocol during bolus administration of intravenous contrast.  CONTRAST:  OMNIPAQUE IOHEXOL 300 MG/ML  SOLN  COMPARISON:  Chest CT only on 12/15/2007 from [facility] radiology  FINDINGS: CT CHEST FINDINGS  Mediastinum/Lymph Nodes: No mediastinal or hilar lymphadenopathy identified. At least 3 left axillary lymph nodes are seen measuring up to 7 mm in short axis which are increased since previous study. Although these are not  considered pathologically enlarged, metastatic disease cannot be excluded.  Lungs/Pleura: No pulmonary infiltrate or mass identified. No effusion present.  Musculoskeletal/Soft Tissues: A 5.5 cm soft tissue mass is seen in the superior left breast, consistent with known primary breast carcinoma.  CT ABDOMEN AND PELVIS FINDINGS  Hepatobiliary: Mild hepatic steatosis is demonstrated, however no liver masses are identified. Gallbladder is unremarkable.  Pancreas: No mass, inflammatory changes, or other significant abnormality identified.  Spleen: A wedge-shaped low-attenuation lesion is seen within the spleen with associated volume loss, consistent with a subacute or chronic splenic infarct. No evidence of splenomegaly or other splenic lesions.  Adrenals:  No masses identified.  Kidneys/Urinary Tract: No evidence of masses or hydronephrosis. Small left renal  cysts again noted.  Stomach/Bowel/Peritoneum: No evidence of wall thickening, mass, or obstruction.  Vascular/Lymphatic: No pathologically enlarged lymph nodes identified. No other significant abnormality visualized.  Reproductive:  No mass or other significant abnormality identified.  Other:  Small paraumbilical hernia noted containing only fat.  Musculoskeletal:  No suspicious bone lesions identified.  IMPRESSION: 5.5 cm left breast mass, consistent with known primary breast carcinoma.  Several left axillary lymph nodes measuring up to 7 mm which are increased since 2009 exam ; lymph node metastases cannot be excluded.  No other sites of metastatic disease identified.  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   Electronically Signed   By: Earle Gell M.D.   On: 01/16/2015 13:07   Nm Bone Scan Whole Body  01/16/2015   CLINICAL DATA:  Known left breast carcinoma, check for possible bony metastatic disease  EXAM: NUCLEAR MEDICINE WHOLE BODY BONE SCAN  TECHNIQUE: Whole body anterior and posterior images were obtained approximately 3 hours after intravenous injection of radiopharmaceutical.  RADIOPHARMACEUTICALS:  Twenty-five mCi Technetium-99 MDP  COMPARISON:  None  FINDINGS: There is adequate uptake of radioactive tracer throughout the bony skeleton. Bilateral renal activity with pooling in the urinary bladder is seen. Increased symmetrical uptake is noted in the sternoclavicular joints bilaterally consistent with degenerative change. Some mild degenerative changes also noted in the superior aspect of the left hip joint anteriorly. No focal area of increased activity to suggest metastatic disease is noted. Very mild S-shaped scoliosis of the thoracolumbar spine is seen as well.  IMPRESSION: Degenerative change in the sternoclavicular joints and left hip joint. No definitive changes of metastatic disease are seen.   Electronically Signed   By: Inez Catalina M.D.   On: 01/16/2015 12:49   Ct  Abdomen Pelvis W Contrast  01/16/2015   CLINICAL DATA:  Newly diagnosed left upper inner quadrant breast carcinoma. Staging.  EXAM: CT CHEST, ABDOMEN, AND PELVIS WITH CONTRAST  TECHNIQUE: Multidetector CT imaging of the chest, abdomen and pelvis was performed following the standard protocol during bolus administration of intravenous contrast.  CONTRAST:  164mL OMNIPAQUE IOHEXOL 300 MG/ML  SOLN  COMPARISON:  Chest CT only on 12/15/2007 from [facility] radiology  FINDINGS: CT CHEST FINDINGS  Mediastinum/Lymph Nodes: No mediastinal or hilar lymphadenopathy identified. At least 3 left axillary lymph nodes are seen measuring up to 7 mm in short axis which are increased since previous study. Although these are not considered pathologically enlarged, metastatic disease cannot be excluded.  Lungs/Pleura: No pulmonary infiltrate or mass identified. No effusion present.  Musculoskeletal/Soft Tissues: A 5.5 cm soft tissue mass is seen in the superior left breast, consistent with known primary breast carcinoma.  CT ABDOMEN AND PELVIS FINDINGS  Hepatobiliary: Mild hepatic steatosis is demonstrated, however no liver masses are identified.  Gallbladder is unremarkable.  Pancreas: No mass, inflammatory changes, or other significant abnormality identified.  Spleen: A wedge-shaped low-attenuation lesion is seen within the spleen with associated volume loss, consistent with a subacute or chronic splenic infarct. No evidence of splenomegaly or other splenic lesions.  Adrenals:  No masses identified.  Kidneys/Urinary Tract: No evidence of masses or hydronephrosis. Small left renal cysts again noted.  Stomach/Bowel/Peritoneum: No evidence of wall thickening, mass, or obstruction.  Vascular/Lymphatic: No pathologically enlarged lymph nodes identified. No other significant abnormality visualized.  Reproductive:  No mass or other significant abnormality identified.  Other:  Small paraumbilical hernia noted containing only fat.   Musculoskeletal:  No suspicious bone lesions identified.  IMPRESSION: 5.5 cm left breast mass, consistent with known primary breast carcinoma.  Several left axillary lymph nodes measuring up to 7 mm which are increased since 2009 exam ; lymph node metastases cannot be excluded.  No other sites of metastatic disease identified.  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   Electronically Signed   By: Earle Gell M.D.   On: 01/16/2015 13:07   Ir Fluoro Guide Cv Line Right  01/16/2015   CLINICAL DATA:  Breast cancer  EXAM: RIGHT PICC LINE PLACEMENT WITH ULTRASOUND AND FLUOROSCOPIC GUIDANCE  FLUOROSCOPY TIME:  12 seconds  PROCEDURE: The patient was advised of the possible risks and complications and agreed to undergo the procedure. The patient was then brought to the angiographic suite for the procedure.  The right arm was prepped with chlorhexidine, draped in the usual sterile fashion using maximum barrier technique (cap and mask, sterile gown, sterile gloves, large sterile sheet, hand hygiene and cutaneous antisepsis) and infiltrated locally with 1% Lidocaine.  Ultrasound demonstrated patency of the right basilic vein, and this was documented with an image. Under real-time ultrasound guidance, this vein was accessed with a 21 gauge micropuncture needle and image documentation was performed. A 0.018 wire was introduced in to the vein. Over this, a 5 [place] double lumen Power PICC was advanced to the lower SVC/right atrial junction. Fluoroscopy during the procedure and fluoro spot radiograph confirms appropriate catheter position. The catheter was flushed and covered with a sterile dressing.  COMPLICATIONS: None  LENGTH: 39 cm  IMPRESSION: Successful right arm Power PICC line placement with ultrasound and fluoroscopic guidance. The catheter is ready for use.   Electronically Signed   By: Maryclare Bean M.D.   On: 01/16/2015 08:59   Ir US Guide Vasc Access Right  01/16/2015   CLINICAL DATA:  Breast cancer   EXAM: RIGHT PICC LINE PLACEMENT WITH ULTRASOUND AND FLUOROSCOPIC GUIDANCE  FLUOROSCOPY TIME:  12 seconds  PROCEDURE: The patient was advised of the possible risks and complications and agreed to undergo the procedure. The patient was then brought to the angiographic suite for the procedure.  The right arm was prepped with chlorhexidine, draped in the usual sterile fashion using maximum barrier technique (cap and mask, sterile gown, sterile gloves, large sterile sheet, hand hygiene and cutaneous antisepsis) and infiltrated locally with 1% Lidocaine.  Ultrasound demonstrated patency of the right basilic vein, and this was documented with an image. Under real-time ultrasound guidance, this vein was accessed with a 21 gauge micropuncture needle and image documentation was performed. A 0.018 wire was introduced in to the vein. Over this, a 5 [place] double lumen Power PICC was advanced to the lower SVC/right atrial junction. Fluoroscopy during the procedure and fluoro spot radiograph confirms appropriate catheter position. The catheter was flushed and covered with  a sterile dressing.  COMPLICATIONS: None  LENGTH: 39 cm  IMPRESSION: Successful right arm Power PICC line placement with ultrasound and fluoroscopic guidance. The catheter is ready for use.   Electronically Signed   By: Maryclare Bean M.D.   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 CT chest abdomen pelvis and bone scan are negative for distant metastatic disease 2. Echocardiogram 01/13/2014 showed an EF of 60-65% 3. Port could not be placed because of  vascular anomalies, PICC line has been placed for initiation of chemotherapy, this would be changed to a port by the second cycle of treatment. 4. Chemotherapy education has been complete and consent has been signed  Return to clinic in 1 week for nadir count check.     Orders Placed This Encounter  Procedures  . CBC with Differential    Standing Status: Future     Number of Occurrences:      Standing Expiration Date: 01/17/2016  . Comprehensive metabolic panel (Cmet) - [facility]    Standing Status: Future     Number of Occurrences:      Standing Expiration Date: 01/17/2016   The patient has a good understanding of the overall plan. she agrees with it. She will call with any problems that may develop before her next visit here.   Rulon Eisenmenger, MD    his wheezing

## 2015-01-17 NOTE — Telephone Encounter (Signed)
, °

## 2015-01-17 NOTE — Progress Notes (Signed)
Met with pt during 1st chemo treatment in infusion. Pt relate she is doing well. Denies needs. Encourage pt to call with questions. Received verbal understanding.

## 2015-01-18 ENCOUNTER — Telehealth: Payer: Self-pay | Admitting: *Deleted

## 2015-01-18 ENCOUNTER — Other Ambulatory Visit: Payer: Self-pay | Admitting: *Deleted

## 2015-01-18 DIAGNOSIS — C50212 Malignant neoplasm of upper-inner quadrant of left female breast: Secondary | ICD-10-CM

## 2015-01-18 MED ORDER — LORAZEPAM 0.5 MG PO TABS: 0.5000 mg | ORAL_TABLET | Freq: Four times a day (QID) | ORAL | Status: DC | PRN

## 2015-01-18 NOTE — Telephone Encounter (Signed)
Patient returned call.  Will route to [facility] for follow -up.  Pt. Call back is [phone].

## 2015-01-18 NOTE — Progress Notes (Signed)
Pt called and relayed she did not have a prescription at [facility] to pick up for lorazepam. Discussed with pt instructions on how to take medication. Received verbal understanding. Called medication in to [facility] on [address].

## 2015-01-18 NOTE — Telephone Encounter (Signed)
-----   Message from Arty Baumgartner, RN sent at 01/17/2015 11:01 AM EST ----- Regarding: first time chemo First time A/C  Dr Lindi Adie [phone]

## 2015-01-18 NOTE — Telephone Encounter (Signed)
error 

## 2015-01-18 NOTE — Telephone Encounter (Signed)
Called and spoke to pt, she has no complaints.  She experienced a hot flash last night and has a metallic taste in her mouth.  No fever, nausea or vomiting.  Advised if anything changes or gets worse to call us back.  She verbalized understanding.

## 2015-01-18 NOTE — Telephone Encounter (Signed)
Called and left a message wanting to see if she was having any issues.  Asked for a call back.

## 2015-01-19 ENCOUNTER — Telehealth: Payer: Self-pay | Admitting: *Deleted

## 2015-01-19 NOTE — Telephone Encounter (Signed)
Pt called to r/s her PICC flush tomorrow to the afternoon. Scheduled and confirmed new appt date and time for 01/20/15. Pt relate she is doing well after chemo. No complaints at this time. Encourage pt to call with further needs. Received verbal understanding. Contact information given.

## 2015-01-20 ENCOUNTER — Ambulatory Visit (HOSPITAL_BASED_OUTPATIENT_CLINIC_OR_DEPARTMENT_OTHER): Payer: Medicaid Other

## 2015-01-20 VITALS — BP 114/56 | HR 75 | Temp 98.0°F | Resp 20

## 2015-01-20 DIAGNOSIS — Z452 Encounter for adjustment and management of vascular access device: Secondary | ICD-10-CM

## 2015-01-20 DIAGNOSIS — C50212 Malignant neoplasm of upper-inner quadrant of left female breast: Secondary | ICD-10-CM

## 2015-01-20 DIAGNOSIS — Z95828 Presence of other vascular implants and grafts: Secondary | ICD-10-CM

## 2015-01-20 MED ORDER — SODIUM CHLORIDE 0.9 % IJ SOLN
10.0000 mL | INTRAMUSCULAR | Status: DC | PRN
  Administered 2015-01-20: 10 mL via INTRAVENOUS
  Filled 2015-01-20: qty 10

## 2015-01-20 MED ORDER — HEPARIN SOD (PORK) LOCK FLUSH 100 UNIT/ML IV SOLN
500.0000 [IU] | Freq: Once | INTRAVENOUS | Status: AC
Start: 2015-01-20 — End: 2015-01-20
  Administered 2015-01-20: 500 [IU] via INTRAVENOUS
  Filled 2015-01-20: qty 5

## 2015-01-23 ENCOUNTER — Encounter: Payer: Self-pay | Admitting: *Deleted

## 2015-01-23 ENCOUNTER — Ambulatory Visit (HOSPITAL_BASED_OUTPATIENT_CLINIC_OR_DEPARTMENT_OTHER): Payer: Medicaid Other | Admitting: Oncology

## 2015-01-23 ENCOUNTER — Other Ambulatory Visit (HOSPITAL_BASED_OUTPATIENT_CLINIC_OR_DEPARTMENT_OTHER): Payer: Medicaid Other

## 2015-01-23 ENCOUNTER — Encounter: Payer: Self-pay | Admitting: Oncology

## 2015-01-23 VITALS — BP 127/64 | HR 92 | Temp 98.0°F | Resp 18 | Ht 62.0 in | Wt 168.0 lb

## 2015-01-23 DIAGNOSIS — D6959 Other secondary thrombocytopenia: Secondary | ICD-10-CM

## 2015-01-23 DIAGNOSIS — C50212 Malignant neoplasm of upper-inner quadrant of left female breast: Secondary | ICD-10-CM

## 2015-01-23 DIAGNOSIS — D701 Agranulocytosis secondary to cancer chemotherapy: Secondary | ICD-10-CM

## 2015-01-23 LAB — CBC WITH DIFFERENTIAL/PLATELET
BASO%: 0 % (ref 0.0–2.0)
BASOS ABS: 0 10*3/uL (ref 0.0–0.1)
EOS ABS: 0.1 10*3/uL (ref 0.0–0.5)
EOS%: 8.2 % — ABNORMAL HIGH (ref 0.0–7.0)
HCT: 40.9 % (ref 34.8–46.6)
HEMOGLOBIN: 13.5 g/dL (ref 11.6–15.9)
LYMPH%: 79.4 % — ABNORMAL HIGH (ref 14.0–49.7)
MCH: 32.1 pg (ref 25.1–34.0)
MCHC: 33 g/dL (ref 31.5–36.0)
MCV: 97.1 fL (ref 79.5–101.0)
MONO#: 0 10*3/uL — AB (ref 0.1–0.9)
MONO%: 3.1 % (ref 0.0–14.0)
NEUT%: 9.3 % — AB (ref 38.4–76.8)
NEUTROS ABS: 0.1 10*3/uL — AB (ref 1.5–6.5)
PLATELETS: 57 10*3/uL — AB (ref 145–400)
RBC: 4.21 10*6/uL (ref 3.70–5.45)
RDW: 13.3 % (ref 11.2–14.5)
WBC: 1 10*3/uL — AB (ref 3.9–10.3)
lymph#: 0.8 10*3/uL — ABNORMAL LOW (ref 0.9–3.3)
nRBC: 0 % (ref 0–0)

## 2015-01-23 LAB — COMPREHENSIVE METABOLIC PANEL (CC13)
ALT: 12 U/L (ref 0–55)
AST: 8 U/L (ref 5–34)
Albumin: 2.9 g/dL — ABNORMAL LOW (ref 3.5–5.0)
Alkaline Phosphatase: 77 U/L (ref 40–150)
Anion Gap: 10 mEq/L (ref 3–11)
BUN: 10.9 mg/dL (ref 7.0–26.0)
CALCIUM: 8.9 mg/dL (ref 8.4–10.4)
CO2: 26 meq/L (ref 22–29)
CREATININE: 0.7 mg/dL (ref 0.6–1.1)
Chloride: 102 mEq/L (ref 98–109)
EGFR: 90 mL/min/{1.73_m2} — ABNORMAL LOW (ref >90)
GLUCOSE: 147 mg/dL — AB (ref 70–140)
Potassium: 4.4 mEq/L (ref 3.5–5.1)
SODIUM: 137 meq/L (ref 136–145)
Total Bilirubin: 0.64 mg/dL (ref 0.20–1.20)
Total Protein: 5.8 g/dL — ABNORMAL LOW (ref 6.4–8.3)

## 2015-01-23 NOTE — Progress Notes (Signed)
Patient Care Team: Antony Blackbird, MD as PCP - General (Family Medicine)  DIAGNOSIS: Breast cancer of upper-inner quadrant of left female breast   Staging form: Breast, AJCC 7th Edition     Clinical: Stage IIIB (T4b, N0, M0) - Signed by Rulon Eisenmenger, MD on 01/17/2015   SUMMARY OF ONCOLOGIC HISTORY:   Breast cancer of upper-inner quadrant of left female breast   12/27/2014 Initial Diagnosis Grade 3 invasive mammary cancer, one lymph node negative in left axilla, ER 0%, PR 0%, Ki-67 98%, HER-2 negative ratio 1.38   01/06/2015 Breast MRI Left breast large enhancing mass, evidence of necrosis, extending from posterior through the middle third of left breast, extending from chest wall to the dermis, close to pectoralis fascia extending 4.5 cm, several bilateral axillary lymph nodes   01/17/2015 -  Neo-Adjuvant Chemotherapy Neoadjuvant chemotherapy with dose dense Adriamycin and Cytoxan followed by Taxol and carboplatin    CHIEF COMPLIANT: S/P cycle 1 dose dense Adriamycin Cytoxan  INTERVAL HISTORY: Patty Buckley is a 64 year old lady with above-mentioned history of left-sided large breast cancer extending to the skin with several bilateral axillary lymph nodes biopsy of the lymph node was negative who started neoadjuvant chemotherapy with dose dense Adriamycin and Cytoxan last week. Overall tolerated well. Reported increased fatigue starting about 3 days after her chemotherapy. Denied nausea and vomiting. Denies fevers. Scheduled for PAC placement later this week with plans to D/C PICC.  REVIEW OF SYSTEMS:   Constitutional: Denies fevers, chills or abnormal weight loss Eyes: Denies blurriness of vision Ears, nose, mouth, throat, and face: Denies mucositis or sore throat Respiratory: Denies cough, dyspnea or wheezes Cardiovascular: Denies palpitation, chest discomfort or lower extremity swelling Gastrointestinal:  Denies nausea, heartburn or change in bowel habits Skin: Denies abnormal skin  rashes Lymphatics: Denies new lymphadenopathy or easy bruising Neurological:Denies numbness, tingling or new weaknesses Behavioral/Psych: Mood is stable, no new changes  Breast:  Breast cancer extending to the skin All other systems were reviewed with the patient and are negative.  I have reviewed the past medical history, past surgical history, social history and family history with the patient and they are unchanged from previous note.  ALLERGIES:  is allergic to codeine.  MEDICATIONS:  Current Outpatient Prescriptions  Medication Sig Dispense Refill  . acetaminophen (TYLENOL) 325 MG tablet Take 650 mg by mouth every 6 (six) hours as needed (Pain).     Marland Kitchen aspirin 81 MG tablet Take 81 mg by mouth daily.      Marland Kitchen dexamethasone (DECADRON) 4 MG tablet Take 2 tablets by mouth once a day on the day after chemotherapy and then take 2 tablets two times a day for 2 days. Take with food. 30 tablet 1  . HYDROcodone-acetaminophen (NORCO/VICODIN) 5-325 MG per tablet Take 1-2 tablets by mouth every 4 (four) hours as needed for moderate pain. 30 tablet 0  . lidocaine-prilocaine (EMLA) cream Apply to affected area once 30 g 3  . lisinopril (PRINIVIL,ZESTRIL) 20 MG tablet TAKE ONE TABLET BY MOUTH ONCE DAILY 90 tablet 1  . LORazepam (ATIVAN) 0.5 MG tablet Take 1 tablet (0.5 mg total) by mouth every 6 (six) hours as needed (Nausea or vomiting). 30 tablet 0  . metoprolol (LOPRESSOR) 50 MG tablet TAKE ONE TABLET BY MOUTH TWICE DAILY 180 tablet 3  . Multiple Vitamin (MULTIVITAMIN) tablet Take 1 tablet by mouth. occasionally    . ondansetron (ZOFRAN) 8 MG tablet Take 1 tablet (8 mg total) by mouth 2 (two) times daily as  needed. Start on the third day after chemotherapy. 30 tablet 1  . prochlorperazine (COMPAZINE) 10 MG tablet Take 1 tablet (10 mg total) by mouth every 6 (six) hours as needed (Nausea or vomiting). 30 tablet 1   No current facility-administered medications for this visit.    PHYSICAL  EXAMINATION: ECOG PERFORMANCE STATUS: 1 - Symptomatic but completely ambulatory  Filed Vitals:   01/23/15 1435  BP: 127/64  Pulse: 92  Temp: 98 F (36.7 C)  Resp: 18   Filed Weights   01/23/15 1435  Weight: 168 lb (76.204 kg)    GENERAL:alert, no distress and comfortable SKIN: skin color, texture, turgor are normal, no rashes or significant lesions EYES: normal, Conjunctiva are pink and non-injected, sclera clear OROPHARYNX:no exudate, no erythema and lips, buccal mucosa, and tongue normal  NECK: supple, thyroid normal size, non-tender, without nodularity LYMPH:  no palpable lymphadenopathy in the cervical, axillary or inguinal LUNGS: clear to auscultation and percussion with normal breathing effort HEART: regular rate & rhythm and no murmurs and no lower extremity edema ABDOMEN:abdomen soft, non-tender and normal bowel sounds Musculoskeletal:no cyanosis of digits and no clubbing  NEURO: alert & oriented x 3 with fluent speech, no focal motor/sensory deficits  LABORATORY DATA:  I have reviewed the data as listed   Chemistry      Component Value Date/Time   NA 137 01/23/2015 1414   NA 142 01/13/2015 1242   K 4.4 01/23/2015 1414   K 4.0 01/13/2015 1242   CL 108 01/13/2015 1242   CO2 26 01/23/2015 1414   CO2 24 01/13/2015 1242   BUN 10.9 01/23/2015 1414   BUN 11 01/13/2015 1242   CREATININE 0.7 01/23/2015 1414   CREATININE 0.77 01/13/2015 1242      Component Value Date/Time   CALCIUM 8.9 01/23/2015 1414   CALCIUM 9.2 01/13/2015 1242   ALKPHOS 77 01/23/2015 1414   ALKPHOS 79 01/14/2012 0838   AST 8 01/23/2015 1414   AST 50* 01/14/2012 0838   ALT 12 01/23/2015 1414   ALT 38* 01/14/2012 0838   BILITOT 0.64 01/23/2015 1414   BILITOT 1.1 01/14/2012 0838       Lab Results  Component Value Date   WBC 1.0* 01/23/2015   HGB 13.5 01/23/2015   HCT 40.9 01/23/2015   MCV 97.1 01/23/2015   PLT 57* 01/23/2015   NEUTROABS 0.1* 01/23/2015     RADIOGRAPHIC  STUDIES: I have personally reviewed the radiology reports and agreed with their findings. No results found.   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 CT chest abdomen pelvis and bone scan are negative for distant metastatic disease 2. Echocardiogram 01/13/2014 showed an EF of 60-65% 3. 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 4. Chemotherapy education has been complete and consent has been signed  Chemotherapy toxicities: 1. Chemotherapy induced neutroepnia: Patient is afebrile. Discussed neutropenic precautions with the patient. Instructed her to call if she develops a fever of 100.5 or higher. 2. Chemotherapy induced thrombocytopenia: Discussed risk of easy bruising and bleeding.  3.  Fatigue: Due to chemotherapy. This is mild at this time.  Return to clinic in 1 week for cycle 2.     No orders of the defined types were placed in this encounter.   The patient has a good understanding of the overall plan. she agrees with it. She will call with any problems that may develop before her next visit here.   Mikey Bussing, NP

## 2015-01-23 NOTE — Assessment & Plan Note (Addendum)
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 CT chest abdomen pelvis and bone scan are negative for distant metastatic disease 2. Echocardiogram 01/13/2014 showed an EF of 60-65% 3. 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 4. Chemotherapy education has been complete and consent has been signed  Chemotherapy toxicities: 1. Chemotherapy induced neutroepnia: Patient is afebrile. Discussed neutropenic precautions with the patient. Instructed her to call if she develops a fever of 100.5 or higher. 2. Chemotherapy induced thrombocytopenia: Discussed risk of easy bruising and bleeding.  3.  Fatigue: Due to chemotherapy. This is mild at this time.  Return to clinic in 1 week for cycle 2.

## 2015-01-23 NOTE — Progress Notes (Signed)
Critical value rcvd from lab.  Hand carried to [employer]

## 2015-01-23 NOTE — Progress Notes (Signed)
Met with pt after nadir check. Pt relate she is doing well. Denies needs at this time. Encourage pt to call with questions. Received verbal understanding.

## 2015-01-24 ENCOUNTER — Inpatient Hospital Stay (HOSPITAL_COMMUNITY)
Admission: EM | Admit: 2015-01-24 | Discharge: 2015-01-27 | DRG: 809 | Disposition: A | Discharge status: Home / Self Care | Payer: Medicaid Other | Attending: Family Medicine | Admitting: Family Medicine

## 2015-01-24 ENCOUNTER — Emergency Department (HOSPITAL_COMMUNITY): Payer: Medicaid Other

## 2015-01-24 ENCOUNTER — Telehealth: Payer: Self-pay

## 2015-01-24 ENCOUNTER — Encounter (HOSPITAL_COMMUNITY): Payer: Self-pay

## 2015-01-24 ENCOUNTER — Other Ambulatory Visit: Payer: Self-pay | Admitting: Radiology

## 2015-01-24 ENCOUNTER — Telehealth: Payer: Self-pay | Admitting: Oncology

## 2015-01-24 DIAGNOSIS — J029 Acute pharyngitis, unspecified: Secondary | ICD-10-CM | POA: Diagnosis present

## 2015-01-24 DIAGNOSIS — K219 Gastro-esophageal reflux disease without esophagitis: Secondary | ICD-10-CM | POA: Diagnosis present

## 2015-01-24 DIAGNOSIS — E559 Vitamin D deficiency, unspecified: Secondary | ICD-10-CM | POA: Diagnosis present

## 2015-01-24 DIAGNOSIS — Z8249 Family history of ischemic heart disease and other diseases of the circulatory system: Secondary | ICD-10-CM

## 2015-01-24 DIAGNOSIS — C50212 Malignant neoplasm of upper-inner quadrant of left female breast: Secondary | ICD-10-CM | POA: Diagnosis present

## 2015-01-24 DIAGNOSIS — R5081 Fever presenting with conditions classified elsewhere: Secondary | ICD-10-CM | POA: Diagnosis present

## 2015-01-24 DIAGNOSIS — D6959 Other secondary thrombocytopenia: Secondary | ICD-10-CM | POA: Diagnosis present

## 2015-01-24 DIAGNOSIS — Z79899 Other long term (current) drug therapy: Secondary | ICD-10-CM | POA: Diagnosis not present

## 2015-01-24 DIAGNOSIS — F17210 Nicotine dependence, cigarettes, uncomplicated: Secondary | ICD-10-CM | POA: Diagnosis present

## 2015-01-24 DIAGNOSIS — I421 Obstructive hypertrophic cardiomyopathy: Secondary | ICD-10-CM | POA: Diagnosis present

## 2015-01-24 DIAGNOSIS — I10 Essential (primary) hypertension: Secondary | ICD-10-CM | POA: Diagnosis present

## 2015-01-24 DIAGNOSIS — E785 Hyperlipidemia, unspecified: Secondary | ICD-10-CM | POA: Diagnosis present

## 2015-01-24 DIAGNOSIS — D709 Neutropenia, unspecified: Secondary | ICD-10-CM | POA: Diagnosis present

## 2015-01-24 DIAGNOSIS — M8580 Other specified disorders of bone density and structure, unspecified site: Secondary | ICD-10-CM | POA: Diagnosis present

## 2015-01-24 DIAGNOSIS — F419 Anxiety disorder, unspecified: Secondary | ICD-10-CM | POA: Diagnosis present

## 2015-01-24 DIAGNOSIS — C50219 Malignant neoplasm of upper-inner quadrant of unspecified female breast: Secondary | ICD-10-CM | POA: Diagnosis present

## 2015-01-24 DIAGNOSIS — T451X5A Adverse effect of antineoplastic and immunosuppressive drugs, initial encounter: Secondary | ICD-10-CM | POA: Diagnosis present

## 2015-01-24 LAB — CBC WITH DIFFERENTIAL/PLATELET
Basophils Absolute: 0 10*3/uL (ref 0.0–0.1)
Basophils Relative: 1 % (ref 0–1)
EOS ABS: 0.1 10*3/uL (ref 0.0–0.7)
EOS PCT: 8 % — AB (ref 0–5)
HCT: 37.8 % (ref 36.0–46.0)
Hemoglobin: 12.5 g/dL (ref 12.0–15.0)
Lymphocytes Relative: 72 % — ABNORMAL HIGH (ref 12–46)
Lymphs Abs: 0.9 10*3/uL (ref 0.7–4.0)
MCH: 31.7 pg (ref 26.0–34.0)
MCHC: 33.1 g/dL (ref 30.0–36.0)
MCV: 95.9 fL (ref 78.0–100.0)
Monocytes Absolute: 0.1 10*3/uL (ref 0.1–1.0)
Monocytes Relative: 12 % (ref 3–12)
NEUTROS PCT: 7 % — AB (ref 43–77)
Neutro Abs: 0.1 10*3/uL — ABNORMAL LOW (ref 1.7–7.7)
PLATELETS: 57 10*3/uL — AB (ref 150–400)
RBC: 3.94 MIL/uL (ref 3.87–5.11)
RDW: 13.2 % (ref 11.5–15.5)
WBC: 1.2 10*3/uL — CL (ref 4.0–10.5)

## 2015-01-24 LAB — COMPREHENSIVE METABOLIC PANEL
ALBUMIN: 3.3 g/dL — AB (ref 3.5–5.2)
ALK PHOS: 60 U/L (ref 39–117)
ALT: 13 U/L (ref 0–35)
ANION GAP: 10 (ref 5–15)
AST: 12 U/L (ref 0–37)
BILIRUBIN TOTAL: 0.4 mg/dL (ref 0.3–1.2)
BUN: 11 mg/dL (ref 6–23)
CALCIUM: 9.1 mg/dL (ref 8.4–10.5)
CO2: 24 mmol/L (ref 19–32)
CREATININE: 0.62 mg/dL (ref 0.50–1.10)
Chloride: 102 mmol/L (ref 96–112)
GFR calc non Af Amer: >90 mL/min (ref >90)
GLUCOSE: 132 mg/dL — AB (ref 70–99)
POTASSIUM: 4 mmol/L (ref 3.5–5.1)
Sodium: 136 mmol/L (ref 135–145)
Total Protein: 6.1 g/dL (ref 6.0–8.3)

## 2015-01-24 LAB — URINALYSIS, ROUTINE W REFLEX MICROSCOPIC
Bilirubin Urine: NEGATIVE
GLUCOSE, UA: NEGATIVE mg/dL
Hgb urine dipstick: NEGATIVE
KETONES UR: NEGATIVE mg/dL
Leukocytes, UA: NEGATIVE
NITRITE: NEGATIVE
Protein, ur: NEGATIVE mg/dL
Specific Gravity, Urine: 1.022 (ref 1.005–1.030)
Urobilinogen, UA: 0.2 mg/dL (ref 0.0–1.0)
pH: 5 (ref 5.0–8.0)

## 2015-01-24 LAB — I-STAT CG4 LACTIC ACID, ED: Lactic Acid, Venous: 1.88 mmol/L (ref 0.5–2.0)

## 2015-01-24 MED ORDER — PIPERACILLIN-TAZOBACTAM 3.375 G IVPB
3.3750 g | Freq: Once | INTRAVENOUS | Status: AC
  Administered 2015-01-24: 3.375 g via INTRAVENOUS
  Filled 2015-01-24: qty 50

## 2015-01-24 MED ORDER — LISINOPRIL 20 MG PO TABS
20.0000 mg | ORAL_TABLET | Freq: Every day | ORAL | Status: DC
  Administered 2015-01-25 – 2015-01-27 (×3): 20 mg via ORAL
  Filled 2015-01-24 (×3): qty 1

## 2015-01-24 MED ORDER — LIDOCAINE-PRILOCAINE 2.5-2.5 % EX CREA: TOPICAL_CREAM | Freq: Once | CUTANEOUS | Status: DC

## 2015-01-24 MED ORDER — ADULT MULTIVITAMIN W/MINERALS CH
1.0000 | ORAL_TABLET | Freq: Every day | ORAL | Status: DC
  Administered 2015-01-25 – 2015-01-27 (×3): 1 via ORAL
  Filled 2015-01-24 (×4): qty 1

## 2015-01-24 MED ORDER — ONDANSETRON HCL 4 MG PO TABS: 4.0000 mg | ORAL_TABLET | Freq: Four times a day (QID) | ORAL | Status: DC | PRN

## 2015-01-24 MED ORDER — ACETAMINOPHEN 325 MG PO TABS: 650.0000 mg | ORAL_TABLET | Freq: Four times a day (QID) | ORAL | Status: DC | PRN

## 2015-01-24 MED ORDER — ASPIRIN 81 MG PO CHEW
81.0000 mg | CHEWABLE_TABLET | Freq: Every day | ORAL | Status: DC
  Administered 2015-01-25 – 2015-01-26 (×2): 81 mg via ORAL
  Filled 2015-01-24 (×5): qty 1

## 2015-01-24 MED ORDER — VANCOMYCIN HCL IN DEXTROSE 1-5 GM/200ML-% IV SOLN
1000.0000 mg | Freq: Two times a day (BID) | INTRAVENOUS | Status: DC
  Administered 2015-01-25 – 2015-01-26 (×3): 1000 mg via INTRAVENOUS
  Filled 2015-01-24 (×3): qty 200

## 2015-01-24 MED ORDER — VANCOMYCIN HCL IN DEXTROSE 1-5 GM/200ML-% IV SOLN
1000.0000 mg | Freq: Once | INTRAVENOUS | Status: AC
  Administered 2015-01-24: 1000 mg via INTRAVENOUS
  Filled 2015-01-24: qty 200

## 2015-01-24 MED ORDER — SODIUM CHLORIDE 0.9 % IV SOLN
INTRAVENOUS | Status: AC
  Administered 2015-01-24 – 2015-01-25 (×2): via INTRAVENOUS

## 2015-01-24 MED ORDER — LORAZEPAM 0.5 MG PO TABS: 0.5000 mg | ORAL_TABLET | Freq: Four times a day (QID) | ORAL | Status: DC | PRN

## 2015-01-24 MED ORDER — HYDROCODONE-ACETAMINOPHEN 5-325 MG PO TABS: 1.0000 | ORAL_TABLET | ORAL | Status: DC | PRN

## 2015-01-24 MED ORDER — ONDANSETRON HCL 4 MG/2ML IJ SOLN: 4.0000 mg | Freq: Four times a day (QID) | INTRAMUSCULAR | Status: DC | PRN

## 2015-01-24 MED ORDER — ACETAMINOPHEN 325 MG PO TABS
650.0000 mg | ORAL_TABLET | Freq: Once | ORAL | Status: AC
  Administered 2015-01-24: 650 mg via ORAL
  Filled 2015-01-24: qty 2

## 2015-01-24 MED ORDER — SODIUM CHLORIDE 0.9 % IV SOLN: INTRAVENOUS | Status: DC

## 2015-01-24 MED ORDER — ACETAMINOPHEN 650 MG RE SUPP: 650.0000 mg | Freq: Four times a day (QID) | RECTAL | Status: DC | PRN

## 2015-01-24 MED ORDER — METOPROLOL TARTRATE 50 MG PO TABS
50.0000 mg | ORAL_TABLET | Freq: Two times a day (BID) | ORAL | Status: DC
  Administered 2015-01-24 – 2015-01-27 (×6): 50 mg via ORAL
  Filled 2015-01-24 (×7): qty 1

## 2015-01-24 MED ORDER — SODIUM CHLORIDE 0.9 % IV SOLN
INTRAVENOUS | Status: DC
  Administered 2015-01-24: 18:00:00 via INTRAVENOUS

## 2015-01-24 MED ORDER — DEXTROSE 5 % IV SOLN
2.0000 g | Freq: Three times a day (TID) | INTRAVENOUS | Status: DC
  Administered 2015-01-24 – 2015-01-26 (×5): 2 g via INTRAVENOUS
  Filled 2015-01-24 (×6): qty 2

## 2015-01-24 NOTE — ED Notes (Signed)
Pt, being sent by [facility], for neutropenic fever workup.  C/o fever and generalized body aches.  Pt reports fever of 100.7.  Pt received chemo last week.  Hx of breast CA.  Per [facility], Burr Medico MD is the Oncologist on-call.

## 2015-01-24 NOTE — Telephone Encounter (Signed)
Pt called in w/temp of 100.7. S/w cyndee and we will send her to ER d/t lateness in the day. Called pt back and instructed her to go to ER. Take her cancer card with her. Will need neutropenic fever workup. To expect urine sample, blood sample, chest x-ray. Cyndee said she would call ER to expect her.

## 2015-01-24 NOTE — Progress Notes (Signed)
ANTIBIOTIC CONSULT NOTE - INITIAL  Pharmacy Consult for Vancomycin / Cefepime Indication: Febrile Neutropenia  Allergies  Allergen Reactions  . Codeine Itching    Patient Measurements: Height: 5\' 3"  (160 cm) Weight: 168 lb (76.204 kg) IBW/kg (Calculated) : 52.4 Adjusted Body Weight:   Vital Signs: Temp: 99 F (37.2 C) (02/16 2042) Temp Source: Oral (02/16 2042) BP: 106/52 mmHg (02/16 2042) Pulse Rate: 107 (02/16 2042) Intake/Output from previous day:   Intake/Output from this shift:    Labs:  Recent Labs  01/23/15 1414 01/23/15 1414 01/24/15 1729  WBC 1.0*  --  1.2*  HGB 13.5  --  12.5  PLT 57*  --  57*  CREATININE  --  0.7 0.62   Estimated Creatinine Clearance: 69.4 mL/min (by C-G formula based on Cr of 0.62). No results for input(s): VANCOTROUGH, VANCOPEAK, VANCORANDOM, GENTTROUGH, GENTPEAK, GENTRANDOM, TOBRATROUGH, TOBRAPEAK, TOBRARND, AMIKACINPEAK, AMIKACINTROU, AMIKACIN in the last 72 hours.   Microbiology: No results found for this or any previous visit (from the past 720 hour(s)).  Medical History: Past Medical History  Diagnosis Date  . Hyperlipidemia   . Hypertension   . Left ventricular outflow obstruction   . Cigarette smoker   . Vitamin D deficiency   . Osteopenia   . GERD (gastroesophageal reflux disease)   . Hemorrhoids   . Hernia   . Ventricular hypertrophy   . Wears glasses   . Anxiety    Assessment: 15 yoF with newly diagnosed breast cancer s/p first cycle of A/C on 2/9 presents with febrile neutropenia.  Pharmacy consulted to dose vancomycin / cefepime.  Vanc / Zosyn x 1 ordered in ED.   2/16 >> zosyn x 1 >> 2/16 2/16 >> Vancomycin  >> 2/16 >> Cefepime >>    Tmax:100.6 WBCs: 1.2K, ANC 0.1 (neulasta on 2/9) Renal: SCr 0.62, CrCl ~69 (N ~80)  2/16 blood: collected 2/16 urine: collected   Goal of Therapy:  Vancomycin trough level 15-20 mcg/ml  Plan:  Vancomycin 1g IV q12h Cefepime 2g IV q8h F/u renal fxn, microbiology,  clinical course  Ralene Bathe, PharmD, BCPS 01/24/2015, 9:03 PM  Pager: [phone]

## 2015-01-24 NOTE — H&P (Signed)
[facility] History and Physical  Patty Buckley:[id number] DOB: 1951/02/16 DOA: 01/24/2015  Referring physician: ER physician. PCP: Antony Blackbird, MD  Chief Complaint: Fever.  HPI: Patty Buckley is a 64 y.o. female history of breast cancer on chemotherapy last chemotherapy was last week started developing fever today and was told to come to the ER by patient's oncologist. Patient was noticed to have some neutropenia yesterday and was advised that if patient develops fever will need close follow-up. Patient denies any productive cough chest pain nausea vomiting abdominal pain diarrhea or skin rash. Patient has been having some sore throat today. Patient also had a recent PICC placement the right upper extremity last week. Patient does complain of some pain around the PICC line area. There is no erythema or discharge around the PICC line insertion area. Patient's neutrophil counts are found to be low and admitted for febrile neutropenia.   Review of Systems: As presented in the history of presenting illness, rest negative.  Past Medical History  Diagnosis Date  . Hyperlipidemia   . Hypertension   . Left ventricular outflow obstruction   . Cigarette smoker   . Vitamin D deficiency   . Osteopenia   . GERD (gastroesophageal reflux disease)   . Hemorrhoids   . Hernia   . Ventricular hypertrophy   . Wears glasses   . Anxiety    Past Surgical History  Procedure Laterality Date  . Tonsillectomy    . Tubal ligation    . US echocardiography  04/24/2007    EF 55-60%  . Cardiovascular stress test  05/07/2007    EF 82% NO ISCHEMIA  . Wisdom tooth extraction    . Portacath placement Right 01/11/2015    Procedure: INSERTION PORT-A-CATH;  Surgeon: Donnie Mesa, MD;  Location: [place];  Service: General;  Laterality: Right;     Attempted  failed to implant,    Social History:  reports that she has been smoking.  She does not have any smokeless tobacco history on  file. She reports that she drinks alcohol. She reports that she does not use illicit drugs. Where does patient live home. Can patient participate in ADLs? Yes.  Allergies  Allergen Reactions  . Codeine Itching    Family History:  Family History  Problem Relation Age of Onset  . Hypertension Mother   . Heart attack Mother   . Heart failure Father   . Hypertension Father       Prior to Admission medications   Medication Sig Start Date End Date Taking? Authorizing Provider  acetaminophen (TYLENOL) 325 MG tablet Take 650 mg by mouth every 6 (six) hours as needed (scratchy throat).    Yes Historical Provider, MD  dexamethasone (DECADRON) 4 MG tablet Take 2 tablets by mouth once a day on the day after chemotherapy and then take 2 tablets two times a day for 2 days. Take with food. 01/09/15  Yes Rulon Eisenmenger, MD  HYDROcodone-acetaminophen (NORCO/VICODIN) 5-325 MG per tablet Take 1-2 tablets by mouth every 4 (four) hours as needed for moderate pain. 01/11/15  Yes Donnie Mesa, MD  lisinopril (PRINIVIL,ZESTRIL) 20 MG tablet TAKE ONE TABLET BY MOUTH ONCE DAILY 11/01/14  Yes Thayer Headings, MD  LORazepam (ATIVAN) 0.5 MG tablet Take 1 tablet (0.5 mg total) by mouth every 6 (six) hours as needed (Nausea or vomiting). 01/18/15  Yes Rulon Eisenmenger, MD  metoprolol (LOPRESSOR) 50 MG tablet TAKE ONE TABLET BY MOUTH TWICE DAILY 11/21/14  Yes Thayer Headings, MD  Multiple Vitamin (MULTIVITAMIN) tablet Take 1 tablet by mouth daily.    Yes Historical Provider, MD  aspirin 81 MG tablet Take 81 mg by mouth daily.      Historical Provider, MD  lidocaine-prilocaine (EMLA) cream Apply to affected area once 01/09/15   Rulon Eisenmenger, MD  ondansetron (ZOFRAN) 8 MG tablet Take 1 tablet (8 mg total) by mouth 2 (two) times daily as needed. Start on the third day after chemotherapy. 01/09/15   Rulon Eisenmenger, MD  prochlorperazine (COMPAZINE) 10 MG tablet Take 1 tablet (10 mg total) by mouth every 6 (six) hours as needed  (Nausea or vomiting). 01/09/15   Rulon Eisenmenger, MD    Physical Exam: Filed Vitals:   01/24/15 1914 01/24/15 1930 01/24/15 1959 01/24/15 2042  BP:  113/62 113/62 106/52  Pulse:  124 115 107  Temp:    99 F (37.2 C)  TempSrc:    Oral  Resp:   18 18  Height: 5\' 3"  (1.6 m)     Weight: 76.204 kg (168 lb)     SpO2:  96% 97% 98%     General:  Well-developed and nourished.  Eyes: Anicteric no pallor.  ENT: No discharge from the ears eyes nose or mouth.  Neck: No mass felt.  Cardiovascular: S1-S2 heard.  Respiratory: No rhonchi or crepitations.  Abdomen: Soft nontender bowel sounds present.  Skin: No rash.  Musculoskeletal: PICC line on the right upper extremity. No edema.  Psychiatric: Appears normal.  Neurologic: Alert awake oriented to time place and person. Moves all extremities.  Labs on Admission:  Basic Metabolic Panel:  Recent Labs Lab 01/23/15 1414 01/24/15 1729  NA 137 136  K 4.4 4.0  CL  --  102  CO2 26 24  GLUCOSE 147* 132*  BUN 10.9 11  CREATININE 0.7 0.62  CALCIUM 8.9 9.1   Liver Function Tests:  Recent Labs Lab 01/23/15 1414 01/24/15 1729  AST 8 12  ALT 12 13  ALKPHOS 77 60  BILITOT 0.64 0.4  PROT 5.8* 6.1  ALBUMIN 2.9* 3.3*   No results for input(s): LIPASE, AMYLASE in the last 168 hours. No results for input(s): AMMONIA in the last 168 hours. CBC:  Recent Labs Lab 01/23/15 1414 01/24/15 1729  WBC 1.0* 1.2*  NEUTROABS 0.1* 0.1*  HGB 13.5 12.5  HCT 40.9 37.8  MCV 97.1 95.9  PLT 57* 57*   Cardiac Enzymes: No results for input(s): CKTOTAL, CKMB, CKMBINDEX, TROPONINI in the last 168 hours.  BNP (last 3 results) No results for input(s): BNP in the last 8760 hours.  ProBNP (last 3 results) No results for input(s): PROBNP in the last 8760 hours.  CBG: No results for input(s): GLUCAP in the last 168 hours.  Radiological Exams on Admission: Dg Chest 2 View  01/24/2015   CLINICAL DATA:  Initial evaluation for fever and dry  cough that began today, current history of breast cancer  EXAM: CHEST  2 VIEW  COMPARISON:  01/11/2015  FINDINGS: Heart size upper normal and stable. Vascular pattern normal. Right PICC line identified with tip just above the cavoatrial junction. No pneumothorax.  No effusion.  No consolidation.  IMPRESSION: No active cardiopulmonary disease.   Electronically Signed   By: Skipper Cliche M.D.   On: 01/24/2015 18:26     Assessment/Plan Principal Problem:   Neutropenic fever Active Problems:   Hypertrophic obstructive cardiomyopathy (HOCM)   Breast cancer of upper-inner quadrant of left  female breast   Febrile neutropenia   1. Febrile neutropenia - the patient on chemotherapy. At this time patient has been placed on cefepime and also add vancomycin since patient recently had a PICC line placed. Follow blood cultures and also will check influenza PCR S patient has had some sore throat like sensation. Closely follow CBC with differential. 2. Hypertrophic obstructive cardiomyopathy - last EF measured this month was 60-65%. Continue present medications including lisinopril and metoprolol. Closely observe respiratory status. 3. Breast cancer - per oncologist. 4. Thrombocytopenia - probably chemotherapy induced. Follow CBC closely.  I have ordered Dopplers of the right upper extremities and patient had complained of some soreness around the PICC line area.   DVT Prophylaxis SCDs as patient has thrombocytopenia.  Code Status: Full code.  Family Communication: Patient's son at the bedside.  Disposition Plan: Admit to inpatient.    Vyron Fronczak N. [facility] Pager [phone].  If 7PM-7AM, please contact night-coverage [email or web address] Password [facility] 01/24/2015, 8:52 PM

## 2015-01-24 NOTE — Telephone Encounter (Signed)
, °

## 2015-01-24 NOTE — ED Notes (Signed)
Pt states routine cancer MD visit.  Called to come here today d/t wbc extremely low and low grade fever.  Pt with generalized aches

## 2015-01-24 NOTE — ED Provider Notes (Signed)
CSN: [id number]     Arrival date & time 01/24/15  1700 History   First MD Initiated Contact with Patient 01/24/15 1708     Chief Complaint  Patient presents with  . chemo card   . Fever     (Consider location/radiation/quality/duration/timing/severity/associated sxs/prior Treatment) HPI    PCP: FULP, CAMMIE, MD Blood pressure 130/65, pulse 80, temperature 99.7 F (37.6 C), temperature source Oral, resp. rate 18, SpO2 100 %.  Patty Buckley is a 64 y.o.female with a significant PMH of hyperlipidemia, hypertension, LV outflow obstruction, Vit D deficiency, osteopenia, GERD, hemorrhoids, ventricular hypertrophy, wears glasses, anxiety presents to the ER with complaints of neutropenia with fever. The patient has newly diagnosed breast cancer and she had her first chemo treatment last week followed by a large burst of steroids. Her WBC went from greater than 11 to know only 1. She was told to check her temperature daily. Today she felt chills and more weak than normal and saw she has a temp of 100.8. She called her oncologist who told her to come to the ER.  Negative Review of Symptoms: Denies cough, dysuria, abdominal pain, headache, neck stiffness, rash, wound, sore throat, ear pain, vomiting or diarrhea.  Past Medical History  Diagnosis Date  . Hyperlipidemia   . Hypertension   . Left ventricular outflow obstruction   . Cigarette smoker   . Vitamin D deficiency   . Osteopenia   . GERD (gastroesophageal reflux disease)   . Hemorrhoids   . Hernia   . Ventricular hypertrophy   . Wears glasses   . Anxiety    Past Surgical History  Procedure Laterality Date  . Tonsillectomy    . Tubal ligation    . US echocardiography  04/24/2007    EF 55-60%  . Cardiovascular stress test  05/07/2007    EF 82% NO ISCHEMIA  . Wisdom tooth extraction    . Portacath placement Right 01/11/2015    Procedure: INSERTION PORT-A-CATH;  Surgeon: Donnie Mesa, MD;  Location: [facility];   Service: General;  Laterality: Right;     Attempted  failed to implant,    Family History  Problem Relation Age of Onset  . Hypertension Mother   . Heart attack Mother   . Heart failure Father   . Hypertension Father    History  Substance Use Topics  . Smoking status: Current Every Day Smoker -- 1.00 packs/day  . Smokeless tobacco: Not on file  . Alcohol Use: Yes     Comment: wine occassionally   OB History    Gravida Para Term Preterm AB TAB SAB Ectopic Multiple Living   2 2 2       2      Review of Systems  10 Systems reviewed and are negative for acute change except as noted in the HPI.   Allergies  Codeine  Home Medications   Prior to Admission medications   Medication Sig Start Date End Date Taking? Authorizing Provider  acetaminophen (TYLENOL) 325 MG tablet Take 650 mg by mouth every 6 (six) hours as needed (scratchy throat).    Yes Historical Provider, MD  dexamethasone (DECADRON) 4 MG tablet Take 2 tablets by mouth once a day on the day after chemotherapy and then take 2 tablets two times a day for 2 days. Take with food. 01/09/15  Yes Rulon Eisenmenger, MD  HYDROcodone-acetaminophen (NORCO/VICODIN) 5-325 MG per tablet Take 1-2 tablets by mouth every 4 (four) hours as needed for moderate  pain. 01/11/15  Yes Donnie Mesa, MD  lisinopril (PRINIVIL,ZESTRIL) 20 MG tablet TAKE ONE TABLET BY MOUTH ONCE DAILY 11/01/14  Yes Thayer Headings, MD  LORazepam (ATIVAN) 0.5 MG tablet Take 1 tablet (0.5 mg total) by mouth every 6 (six) hours as needed (Nausea or vomiting). 01/18/15  Yes Rulon Eisenmenger, MD  metoprolol (LOPRESSOR) 50 MG tablet TAKE ONE TABLET BY MOUTH TWICE DAILY 11/21/14  Yes Thayer Headings, MD  Multiple Vitamin (MULTIVITAMIN) tablet Take 1 tablet by mouth daily.    Yes Historical Provider, MD  aspirin 81 MG tablet Take 81 mg by mouth daily.      Historical Provider, MD  lidocaine-prilocaine (EMLA) cream Apply to affected area once 01/09/15   Rulon Eisenmenger, MD  ondansetron  (ZOFRAN) 8 MG tablet Take 1 tablet (8 mg total) by mouth 2 (two) times daily as needed. Start on the third day after chemotherapy. 01/09/15   Rulon Eisenmenger, MD  prochlorperazine (COMPAZINE) 10 MG tablet Take 1 tablet (10 mg total) by mouth every 6 (six) hours as needed (Nausea or vomiting). 01/09/15   Rulon Eisenmenger, MD   BP 116/57 mmHg  Pulse 122  Temp(Src) 100.6 F (38.1 C) (Oral)  Resp 18  Ht 5\' 3"  (1.6 m)  Wt 168 lb (76.204 kg)  BMI 29.77 kg/m2  SpO2 98% Physical Exam  Constitutional: She is oriented to person, place, and time. She appears well-developed and well-nourished. No distress.  HENT:  Head: Normocephalic and atraumatic.  Right Ear: External ear normal.  Left Ear: External ear normal.  Nose: Nose normal.  Mouth/Throat: No oropharyngeal exudate.  Eyes: Pupils are equal, round, and reactive to light.  Neck: Normal range of motion. Neck supple.  Cardiovascular: Normal rate and regular rhythm.   Pulmonary/Chest: Effort normal. No respiratory distress. She has no wheezes. She has no rales.  Abdominal: Soft. She exhibits no distension. There is no tenderness.  Musculoskeletal: Normal range of motion.  Neurological: She is alert and oriented to person, place, and time.  Skin: Skin is warm and dry. No rash noted.  Nursing note and vitals reviewed.   ED Course  Procedures (including critical care time) Labs Review Labs Reviewed  CBC WITH DIFFERENTIAL/PLATELET - Abnormal; Notable for the following:    WBC 1.2 (*)    Platelets 57 (*)    Neutrophils Relative % 7 (*)    Lymphocytes Relative 72 (*)    Eosinophils Relative 8 (*)    Neutro Abs 0.1 (*)    All other components within normal limits  COMPREHENSIVE METABOLIC PANEL - Abnormal; Notable for the following:    Glucose, Bld 132 (*)    Albumin 3.3 (*)    All other components within normal limits  CULTURE, BLOOD (ROUTINE X 2)  CULTURE, BLOOD (ROUTINE X 2)  URINE CULTURE  URINALYSIS, ROUTINE W REFLEX MICROSCOPIC   PATHOLOGIST SMEAR REVIEW  I-STAT CG4 LACTIC ACID, ED    Imaging Review Dg Chest 2 View  01/24/2015   CLINICAL DATA:  Initial evaluation for fever and dry cough that began today, current history of breast cancer  EXAM: CHEST  2 VIEW  COMPARISON:  01/11/2015  FINDINGS: Heart size upper normal and stable. Vascular pattern normal. Right PICC line identified with tip just above the cavoatrial junction. No pneumothorax.  No effusion.  No consolidation.  IMPRESSION: No active cardiopulmonary disease.   Electronically Signed   By: Skipper Cliche M.D.   On: 01/24/2015 18:26  EKG Interpretation None      MDM   Final diagnoses:  Fever and neutropenia   5:36 pm Patient is going to get work-up for neutropenic fever- initially she has no obvious source of infection.  7:35 pm No source of infection found. The patient was not tachycardic originally on arrival but she is now tachy at 21, she is emotionally distressed that she needs to stay. Her temp is 100.6. Will order Tylenol.   Medications  0.9 %  sodium chloride infusion ( Intravenous New Bag/Given 01/24/15 1734)  vancomycin (VANCOCIN) IVPB 1000 mg/200 mL premix (not administered)  0.9 %  sodium chloride infusion (not administered)  piperacillin-tazobactam (ZOSYN) IVPB 3.375 g (3.375 g Intravenous New Bag/Given 01/24/15 1919)    I spoke with Dr. Hal Hope who has agreed to admit. [facility] admits, [facility].   Linus Mako, PA-C 01/24/15 1937  Leota Jacobsen, MD 01/25/15 1050

## 2015-01-24 NOTE — Telephone Encounter (Signed)
Called and spoke with ED Charge RN Alaina to let her now patient arriving by car to ED with neutropnic fever to max of 100.7 and ANC yesterday of 0.1.    Patient's only complaint is myalgias.    Have also notified Dr. Burr Medico (on call MD).

## 2015-01-25 DIAGNOSIS — M79609 Pain in unspecified limb: Secondary | ICD-10-CM

## 2015-01-25 LAB — CBC WITH DIFFERENTIAL/PLATELET
BASOS ABS: 0 10*3/uL (ref 0.0–0.1)
Basophils Relative: 2 % — ABNORMAL HIGH (ref 0–1)
EOS ABS: 0.1 10*3/uL (ref 0.0–0.7)
Eosinophils Relative: 6 % — ABNORMAL HIGH (ref 0–5)
HEMATOCRIT: 35.3 % — AB (ref 36.0–46.0)
Hemoglobin: 11.6 g/dL — ABNORMAL LOW (ref 12.0–15.0)
Lymphocytes Relative: 48 % — ABNORMAL HIGH (ref 12–46)
Lymphs Abs: 0.9 10*3/uL (ref 0.7–4.0)
MCH: 31.8 pg (ref 26.0–34.0)
MCHC: 32.9 g/dL (ref 30.0–36.0)
MCV: 96.7 fL (ref 78.0–100.0)
Monocytes Absolute: 0.3 10*3/uL (ref 0.1–1.0)
Monocytes Relative: 14 % — ABNORMAL HIGH (ref 3–12)
NEUTROS ABS: 0.5 10*3/uL — AB (ref 1.7–7.7)
Neutrophils Relative %: 30 % — ABNORMAL LOW (ref 43–77)
Platelets: 67 10*3/uL — ABNORMAL LOW (ref 150–400)
RBC: 3.65 MIL/uL — ABNORMAL LOW (ref 3.87–5.11)
RDW: 13.3 % (ref 11.5–15.5)
WBC: 1.8 10*3/uL — ABNORMAL LOW (ref 4.0–10.5)

## 2015-01-25 LAB — INFLUENZA PANEL BY PCR (TYPE A & B)
H1N1 flu by pcr: NOT DETECTED
INFLAPCR: NEGATIVE
INFLBPCR: NEGATIVE

## 2015-01-25 LAB — COMPREHENSIVE METABOLIC PANEL
ALT: 12 U/L (ref 0–35)
ANION GAP: 6 (ref 5–15)
AST: 12 U/L (ref 0–37)
Albumin: 2.9 g/dL — ABNORMAL LOW (ref 3.5–5.2)
Alkaline Phosphatase: 53 U/L (ref 39–117)
BUN: 8 mg/dL (ref 6–23)
CO2: 25 mmol/L (ref 19–32)
CREATININE: 0.67 mg/dL (ref 0.50–1.10)
Calcium: 8.3 mg/dL — ABNORMAL LOW (ref 8.4–10.5)
Chloride: 107 mmol/L (ref 96–112)
GFR calc non Af Amer: >90 mL/min (ref >90)
Glucose, Bld: 124 mg/dL — ABNORMAL HIGH (ref 70–99)
Potassium: 4.2 mmol/L (ref 3.5–5.1)
Sodium: 138 mmol/L (ref 135–145)
TOTAL PROTEIN: 5.5 g/dL — AB (ref 6.0–8.3)
Total Bilirubin: 0.5 mg/dL (ref 0.3–1.2)

## 2015-01-25 LAB — PATHOLOGIST SMEAR REVIEW

## 2015-01-25 MED ORDER — HEPARIN SODIUM (PORCINE) 5000 UNIT/ML IJ SOLN
5000.0000 [IU] | Freq: Three times a day (TID) | INTRAMUSCULAR | Status: DC
  Administered 2015-01-25 – 2015-01-26 (×5): 5000 [IU] via SUBCUTANEOUS
  Filled 2015-01-25 (×9): qty 1

## 2015-01-25 NOTE — Clinical Documentation Improvement (Signed)
Neutropenic fever and thrombocytopenia currently documented in chart. Current labs as below.  Please identify and document any additional clinical conditions associated with the decreased WBC, RBC, and platelets, if any, and carry over to the discharge summary.    Component WBC NEUT# Hemoglobin HCT  Latest Ref Rng 4.0 - 10.5 K/uL 1.7 - 7.7 K/uL 12.0 - 15.0 g/dL 36.0 - 46.0 %  01/23/2015 1.0 (L) 0.1 (LL) 13.5 40.9  01/24/2015 1.2 (LL) 0.1 (L) 12.5 37.8  01/25/2015 1.8 (L) 0.5 (L) 11.6 (L) 35.3 (L)   Component Platelets RBC  Latest Ref Rng 150 - 400 K/uL 3.87 - 5.11 MIL/uL  01/23/2015 57 (L) 4.21  01/24/2015 57 (L) 3.94  01/25/2015 67 (L) 3.65 (L)     Possible Clinical Conditions: -pancytopenia - chemo-induced -pancytopenia - other (please specify) -other condition -unable to determine at present  Thank you, Mateo Flow, RN [phone] Clinical Documentation Specialist

## 2015-01-25 NOTE — Progress Notes (Signed)
[facility] PROGRESS NOTE  Patty Buckley YJE:[id number] DOB: [birth year] DOA: 01/24/2015 PCP: Antony Blackbird, MD  Assessment/Plan:  Principal Problem:   Neutropenic fever -  No clear source based workup. Chest x-ray obtained and reported no active cardiopulmonary disease - Urinalysis negative -  blood cultures and urine culture obtained and currently pending - Plan will be to narrow antibiotics once patient is fever free for 24 hours with continued improvement in WBC levels.   Active Problems:   Hypertrophic obstructive cardiomyopathy (HOCM) - Patient currently on metoprolol and aspirin no new complaints    Breast cancer of upper-inner quadrant of left female breast - Followed by oncologist and will continue routine recommendations and evaluation with improvement in condition  A superficial thrombus -No evidence of DVT, continue aspirin for superficial thrombus - If any further problems arise patient will be followed with heme onc who can continue to make further recommendations should any new problems arise after discharging from the hospital - Patient on heparin for DVT prophylaxis   Code Status: Full code Family Communication: Discussed with patient and family at bedside Disposition Plan:    Consultants:  None  Procedures:  None  Antibiotics:  Cefepime and Vancomycin  HPI/Subjective: Pt has no new complaints. She had questions regarding discharge date and next chemotherapy  Objective: Filed Vitals:   01/25/15 0509  BP: 112/46  Pulse: 94  Temp: 98.8 F (37.1 C)  Resp: 18    Intake/Output Summary (Last 24 hours) at 01/25/15 1234 Last data filed at 01/25/15 6378  Gross per 24 hour  Intake    240 ml  Output      3 ml  Net    237 ml   Filed Weights   01/24/15 1914  Weight: 76.204 kg (168 lb)    Exam:   General:  Patient in no acute distress, alert and awake  Cardiovascular: Regular rate and rhythm, no murmurs or rubs  Respiratory: No  increased work of breathing, no wheezes, equal chest rise  Abdomen: Soft, nondistended, nontender  Musculoskeletal: No cyanosis or clubbing   Data Reviewed: Basic Metabolic Panel:  Recent Labs Lab 01/23/15 1414 01/24/15 1729 01/25/15 0500  NA 137 136 138  K 4.4 4.0 4.2  CL  --  102 107  CO2 26 24 25   GLUCOSE 147* 132* 124*  BUN 10.9 11 8   CREATININE 0.7 0.62 0.67  CALCIUM 8.9 9.1 8.3*   Liver Function Tests:  Recent Labs Lab 01/23/15 1414 01/24/15 1729 01/25/15 0500  AST 8 12 12   ALT 12 13 12   ALKPHOS 77 60 53  BILITOT 0.64 0.4 0.5  PROT 5.8* 6.1 5.5*  ALBUMIN 2.9* 3.3* 2.9*   No results for input(s): LIPASE, AMYLASE in the last 168 hours. No results for input(s): AMMONIA in the last 168 hours. CBC:  Recent Labs Lab 01/23/15 1414 01/24/15 1729 01/25/15 0500  WBC 1.0* 1.2* 1.8*  NEUTROABS 0.1* 0.1* 0.5*  HGB 13.5 12.5 11.6*  HCT 40.9 37.8 35.3*  MCV 97.1 95.9 96.7  PLT 57* 57* 67*   Cardiac Enzymes: No results for input(s): CKTOTAL, CKMB, CKMBINDEX, TROPONINI in the last 168 hours. BNP (last 3 results) No results for input(s): BNP in the last 8760 hours.  ProBNP (last 3 results) No results for input(s): PROBNP in the last 8760 hours.  CBG: No results for input(s): GLUCAP in the last 168 hours.  No results found for this or any previous visit (from the past 240 hour(s)).   Studies: Dg  Chest 2 View  01/24/2015   CLINICAL DATA:  Initial evaluation for fever and dry cough that began today, current history of breast cancer  EXAM: CHEST  2 VIEW  COMPARISON:  01/11/2015  FINDINGS: Heart size upper normal and stable. Vascular pattern normal. Right PICC line identified with tip just above the cavoatrial junction. No pneumothorax.  No effusion.  No consolidation.  IMPRESSION: No active cardiopulmonary disease.   Electronically Signed   By: Skipper Cliche M.D.   On: 01/24/2015 18:26    Scheduled Meds: . aspirin  81 mg Oral Daily  . ceFEPime (MAXIPIME) IV   2 g Intravenous 3 times per day  . heparin subcutaneous  5,000 Units Subcutaneous 3 times per day  . lisinopril  20 mg Oral Daily  . metoprolol  50 mg Oral BID  . multivitamin with minerals  1 tablet Oral Daily  . vancomycin  1,000 mg Intravenous Q12H   Continuous Infusions: . sodium chloride 50 mL/hr at 01/24/15 2124     Time spent: > 35 minutes    Velvet Bathe  [facility] Pager [phone] If 7PM-7AM, please contact night-coverage at [email or web address], password [facility] 01/25/2015, 12:34 PM  LOS: 1 day

## 2015-01-25 NOTE — Care Management Note (Signed)
CARE MANAGEMENT NOTE 01/25/2015  Patient:  Patty Buckley, Patty Buckley   Account Number:  [id number]  Date Initiated:  01/25/2015  Documentation initiated by:  Marney Doctor  Subjective/Objective Assessment:   64 yo admitted with Neutropenic fever.  history of breast cancer on chemotherapy     Action/Plan:   From home with son   Anticipated DC Date:  01/28/2015   Anticipated DC Plan:  HOME/SELF CARE  In-house referral  [place]  CM consult      Choice offered to / List presented to:             Status of service:  In process, will continue to follow Medicare Important Message given?   (If response is "NO", the following Medicare IM given date fields will be blank) Date Medicare IM given:   Medicare IM given by:   Date Additional Medicare IM given:   Additional Medicare IM given by:    Discharge Disposition:    Per UR Regulation:  Reviewed for med. necessity/level of care/duration of stay  If discussed at [place] of Stay Meetings, dates discussed:    Comments:  01/25/15 Marney Doctor RN,BSN,NCM [phone] Chart reviewed.  Pt is active at the [facility] and has a PCP. She is Medicaid potential.  CM will continue to follow and assist with DC needs.

## 2015-01-25 NOTE — Progress Notes (Signed)
Right upper extremity venous duplex completed.  Right:  No evidence of DVT.  There appears to be superficial thrombosis in the basilic vein.  Left:  Negative for DVT in subclavian vein.

## 2015-01-26 ENCOUNTER — Telehealth: Payer: Self-pay | Admitting: *Deleted

## 2015-01-26 LAB — URINE CULTURE
Colony Count: 50000
Special Requests: NORMAL

## 2015-01-26 LAB — CBC
HCT: 33.2 % — ABNORMAL LOW (ref 36.0–46.0)
HEMOGLOBIN: 11.2 g/dL — AB (ref 12.0–15.0)
MCH: 32.7 pg (ref 26.0–34.0)
MCHC: 33.7 g/dL (ref 30.0–36.0)
MCV: 97.1 fL (ref 78.0–100.0)
Platelets: 100 10*3/uL — ABNORMAL LOW (ref 150–400)
RBC: 3.42 MIL/uL — AB (ref 3.87–5.11)
RDW: 13.3 % (ref 11.5–15.5)
WBC: 4.5 10*3/uL (ref 4.0–10.5)

## 2015-01-26 MED ORDER — LEVOFLOXACIN 750 MG PO TABS
750.0000 mg | ORAL_TABLET | Freq: Every day | ORAL | Status: DC
Start: 2015-01-26 — End: 2015-01-27
  Administered 2015-01-26 – 2015-01-27 (×2): 750 mg via ORAL
  Filled 2015-01-26 (×2): qty 1

## 2015-01-26 MED ORDER — LEVOFLOXACIN IN D5W 750 MG/150ML IV SOLN
750.0000 mg | INTRAVENOUS | Status: DC
  Filled 2015-01-26: qty 150

## 2015-01-26 NOTE — Progress Notes (Signed)
[facility] PROGRESS NOTE  Patty Buckley WJX:[id number] DOB: 07-24-1951 DOA: 01/24/2015 PCP: Antony Blackbird, MD  Assessment/Plan:  Principal Problem:   Neutropenic fever -  No clear source based workup. 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 female breast - Followed by oncologist and will continue routine recommendations and evaluation with improvement in condition  A superficial thrombus -No evidence of DVT, continue aspirin for superficial thrombus - If any further problems arise patient will be followed with heme onc who can continue to make further recommendations should any new problems arise after discharging from the hospital - Patient on heparin for DVT prophylaxis   Code Status: Full code Family Communication: Discussed with patient and family at bedside Disposition Plan: If continued improvement will plan on discharging next am.   Consultants:  None  Procedures:  None  Antibiotics:  Cefepime and Vancomycin>>> levaquin  HPI/Subjective: Pt has no new complaints.   Objective: Filed Vitals:   01/26/15 0539  BP: 93/52  Pulse: 98  Temp: 98.3 F (36.8 C)  Resp: 18    Intake/Output Summary (Last 24 hours) at 01/26/15 1203 Last data filed at 01/26/15 1100  Gross per 24 hour  Intake    595 ml  Output    953 ml  Net   -358 ml   Filed Weights   01/24/15 1914  Weight: 76.204 kg (168 lb)    Exam:   General:  Patient in no acute distress, alert and awake  Cardiovascular: Regular rate and rhythm, no murmurs or rubs  Respiratory: No increased work of breathing,  no wheezes, equal chest rise  Abdomen: Soft, nondistended, nontender  Musculoskeletal: No cyanosis or clubbing   Data Reviewed: Basic Metabolic Panel:  Recent Labs Lab 01/23/15 1414 01/24/15 1729 01/25/15 0500  NA 137 136 138  K 4.4 4.0 4.2  CL  --  102 107  CO2 26 24 25   GLUCOSE 147* 132* 124*  BUN 10.9 11 8   CREATININE 0.7 0.62 0.67  CALCIUM 8.9 9.1 8.3*   Liver Function Tests:  Recent Labs Lab 01/23/15 1414 01/24/15 1729 01/25/15 0500  AST 8 12 12   ALT 12 13 12   ALKPHOS 77 60 53  BILITOT 0.64 0.4 0.5  PROT 5.8* 6.1 5.5*  ALBUMIN 2.9* 3.3* 2.9*   No results for input(s): LIPASE, AMYLASE in the last 168 hours. No results for input(s): AMMONIA in the last 168 hours. CBC:  Recent Labs Lab 01/23/15 1414 01/24/15 1729 01/25/15 0500 01/26/15 0840  WBC 1.0* 1.2* 1.8* 4.5  NEUTROABS 0.1* 0.1* 0.5*  --   HGB 13.5 12.5 11.6* 11.2*  HCT 40.9 37.8 35.3* 33.2*  MCV 97.1 95.9 96.7 97.1  PLT 57* 57* 67* 100*   Cardiac Enzymes: No results for input(s): CKTOTAL, CKMB, CKMBINDEX, TROPONINI in the last 168 hours. BNP (last 3 results) No results for input(s): BNP in the last 8760 hours.  ProBNP (last 3 results) No results for input(s): PROBNP in the last 8760 hours.  CBG: No results for input(s): GLUCAP in the last 168 hours.  Recent Results (from the past 240 hour(s))  Culture, blood (routine x 2)  Status: None (Preliminary result)   Collection Time: 01/24/15  5:29 PM  Result Value Ref Range Status   Specimen Description BLOOD PICC LINE  Final   Special Requests BOTTLES DRAWN AEROBIC AND ANAEROBIC 5CC EACH  Final   Culture   Final           BLOOD CULTURE RECEIVED NO GROWTH TO DATE CULTURE WILL BE HELD FOR 5 DAYS BEFORE ISSUING A FINAL NEGATIVE REPORT Performed at [facility]    Report Status PENDING  Incomplete  Culture, blood (routine x 2)     Status: None (Preliminary result)   Collection Time: 01/24/15  5:30 PM  Result Value Ref Range Status    Specimen Description BLOOD LEFT ANTECUBITAL  Final   Special Requests BOTTLES DRAWN AEROBIC AND ANAEROBIC 5CC EACH  Final   Culture   Final           BLOOD CULTURE RECEIVED NO GROWTH TO DATE CULTURE WILL BE HELD FOR 5 DAYS BEFORE ISSUING A FINAL NEGATIVE REPORT Performed at [facility]    Report Status PENDING  Incomplete  Urine culture     Status: None   Collection Time: 01/24/15  6:34 PM  Result Value Ref Range Status   Specimen Description URINE, CLEAN CATCH  Final   Special Requests Normal  Final   Colony Count   Final    50,000 COLONIES/ML Performed at [facility]    Culture   Final    Multiple bacterial morphotypes present, none predominant. Suggest appropriate recollection if clinically indicated. Performed at [facility]    Report Status 01/26/2015 FINAL  Final     Studies: Dg Chest 2 View  01/24/2015   CLINICAL DATA:  Initial evaluation for fever and dry cough that began today, current history of breast cancer  EXAM: CHEST  2 VIEW  COMPARISON:  01/11/2015  FINDINGS: Heart size upper normal and stable. Vascular pattern normal. Right PICC line identified with tip just above the cavoatrial junction. No pneumothorax.  No effusion.  No consolidation.  IMPRESSION: No active cardiopulmonary disease.   Electronically Signed   By: Skipper Cliche M.D.   On: 01/24/2015 18:26    Scheduled Meds: . aspirin  81 mg Oral Daily  . heparin subcutaneous  5,000 Units Subcutaneous 3 times per day  . lisinopril  20 mg Oral Daily  . metoprolol  50 mg Oral BID  . multivitamin with minerals  1 tablet Oral Daily   Continuous Infusions:     Time spent: > 35 minutes    Velvet Bathe  [facility] Pager [phone] If 7PM-7AM, please contact night-coverage at [email or web address], password [facility] 01/26/2015, 12:03 PM  LOS: 2 days

## 2015-01-26 NOTE — Telephone Encounter (Signed)
Received call from pt stating she is currently in the hospital and scheduled for port placement. Informed pt that I would cancel port placement and notify physician team. Confirmed with pt to keep f/u appt with Dr. Lindi Adie on 01/30/15. Called to cancel port placement. Informed we would call to r/s. Pt denies further needs at this time.

## 2015-01-26 NOTE — Progress Notes (Signed)
ANTIBIOTIC CONSULT NOTE - INITIAL  Pharmacy Consult for Levaquin Indication: Febrile Neutropenia  Allergies  Allergen Reactions  . Codeine Itching   Patient Measurements: Height: 5\' 3"  (160 cm) Weight: 168 lb (76.204 kg) IBW/kg (Calculated) : 52.4  Vital Signs: Temp: 98.3 F (36.8 C) (02/18 0539) Temp Source: Oral (02/18 0539) BP: 93/52 mmHg (02/18 0539) Pulse Rate: 98 (02/18 0539) Intake/Output from previous day: 02/17 0701 - 02/18 0700 In: 715 [P.O.:240; I.V.:475] Out: 5 [Urine:4; Stool:1] Intake/Output from this shift: Total I/O In: 120 [P.O.:120] Out: 950 [Urine:950]  Labs:  Recent Labs  01/23/15 1414 01/24/15 1729 01/25/15 0500 01/26/15 0840  WBC  --  1.2* 1.8* 4.5  HGB  --  12.5 11.6* 11.2*  PLT  --  57* 67* 100*  CREATININE 0.7 0.62 0.67  --    Estimated Creatinine Clearance: 69.4 mL/min (by C-G formula based on Cr of 0.67). No results for input(s): VANCOTROUGH, VANCOPEAK, VANCORANDOM, GENTTROUGH, GENTPEAK, GENTRANDOM, TOBRATROUGH, TOBRAPEAK, TOBRARND, AMIKACINPEAK, AMIKACINTROU, AMIKACIN in the last 72 hours.   Microbiology: Recent Results (from the past 720 hour(s))  Culture, blood (routine x 2)     Status: None (Preliminary result)   Collection Time: 01/24/15  5:29 PM  Result Value Ref Range Status   Specimen Description BLOOD PICC LINE  Final   Special Requests BOTTLES DRAWN AEROBIC AND ANAEROBIC 5CC EACH  Final   Culture   Final           BLOOD CULTURE RECEIVED NO GROWTH TO DATE CULTURE WILL BE HELD FOR 5 DAYS BEFORE ISSUING A FINAL NEGATIVE REPORT Performed at [facility]    Report Status PENDING  Incomplete  Culture, blood (routine x 2)     Status: None (Preliminary result)   Collection Time: 01/24/15  5:30 PM  Result Value Ref Range Status   Specimen Description BLOOD LEFT ANTECUBITAL  Final   Special Requests BOTTLES DRAWN AEROBIC AND ANAEROBIC 5CC EACH  Final   Culture   Final           BLOOD CULTURE RECEIVED NO GROWTH TO DATE  CULTURE WILL BE HELD FOR 5 DAYS BEFORE ISSUING A FINAL NEGATIVE REPORT Performed at [facility]    Report Status PENDING  Incomplete  Urine culture     Status: None   Collection Time: 01/24/15  6:34 PM  Result Value Ref Range Status   Specimen Description URINE, CLEAN CATCH  Final   Special Requests Normal  Final   Colony Count   Final    50,000 COLONIES/ML Performed at [facility]    Culture   Final    Multiple bacterial morphotypes present, none predominant. Suggest appropriate recollection if clinically indicated. Performed at [facility]    Report Status 01/26/2015 FINAL  Final   Medical History: Past Medical History  Diagnosis Date  . Hyperlipidemia   . Hypertension   . Left ventricular outflow obstruction   . Cigarette smoker   . Vitamin D deficiency   . Osteopenia   . GERD (gastroesophageal reflux disease)   . Hemorrhoids   . Hernia   . Ventricular hypertrophy   . Wears glasses   . Anxiety    Medications:  Scheduled:  . aspirin  81 mg Oral Daily  . heparin subcutaneous  5,000 Units Subcutaneous 3 times per day  . levofloxacin (LEVAQUIN) IV  750 mg Intravenous Q24H  . lisinopril  20 mg Oral Daily  . metoprolol  50 mg Oral BID  . multivitamin  with minerals  1 tablet Oral Daily   Anti-infectives    Start     Dose/Rate Route Frequency Ordered Stop   01/26/15 1300  levofloxacin (LEVAQUIN) IVPB 750 mg     750 mg 100 mL/hr over 90 Minutes Intravenous Every 24 hours 01/26/15 1207     01/25/15 0800  vancomycin (VANCOCIN) IVPB 1000 mg/200 mL premix  Status:  Discontinued     1,000 mg 200 mL/hr over 60 Minutes Intravenous Every 12 hours 01/24/15 2104 01/26/15 1141   01/24/15 2200  ceFEPIme (MAXIPIME) 2 g in dextrose 5 % 50 mL IVPB  Status:  Discontinued     2 g 100 mL/hr over 30 Minutes Intravenous 3 times per day 01/24/15 2104 01/26/15 1141   01/24/15 1915  vancomycin (VANCOCIN) IVPB 1000 mg/200 mL premix     1,000 mg 200 mL/hr over 60  Minutes Intravenous  Once 01/24/15 1912 01/24/15 2059   01/24/15 1915  piperacillin-tazobactam (ZOSYN) IVPB 3.375 g     3.375 g 12.5 mL/hr over 240 Minutes Intravenous  Once 01/24/15 1912 01/24/15 1958     Assessment: 31 yoF with newly diagnosed breast cancer s/p first cycle of A/C on 2/9, received Neulasta on 2/10; presents with febrile neutropenia. Pharmacy consulted to dose vancomycin / cefepime beginning 2/16.  Day 3 abx, WBC 1.0-> 1.8-> 4.5K this am, Afebrile > 24 hr, blood urine cx negative, Influenza panel negative  Change abx to Levaquin   Goal of Therapy:  Antibiotic dose/schedule appropriate for treatment, renal function  Plan:   Change to Levaquin 750mg  q24  Minda Ditto PharmD Pager [phone] 01/26/2015, 12:12 PM

## 2015-01-27 ENCOUNTER — Ambulatory Visit (HOSPITAL_COMMUNITY): Payer: Medicaid Other

## 2015-01-27 ENCOUNTER — Other Ambulatory Visit (HOSPITAL_COMMUNITY): Payer: Self-pay

## 2015-01-27 MED ORDER — LEVOFLOXACIN 500 MG PO TABS: 500.0000 mg | ORAL_TABLET | Freq: Every day | ORAL | Status: DC

## 2015-01-27 MED ORDER — HEPARIN SOD (PORK) LOCK FLUSH 100 UNIT/ML IV SOLN
500.0000 [IU] | Freq: Once | INTRAVENOUS | Status: DC
  Filled 2015-01-27: qty 5

## 2015-01-27 NOTE — Discharge Summary (Signed)
Physician Discharge Summary  Patty Buckley YQM:[id number] DOB: August 19, 1951 DOA: 01/24/2015  PCP: Patty Blackbird, MD  Admit date: 01/24/2015 Discharge date: 01/27/2015  Time spent: >35 minutes  Recommendations for Outpatient Follow-up:  1. Please be sure to follow up with your primary care physician.  Discharge Diagnoses:  Principal Problem:   Neutropenic fever Active Problems:   Hypertrophic obstructive cardiomyopathy (HOCM)   Breast cancer of upper-inner quadrant of left female breast   Febrile neutropenia   Discharge Condition: stable  Diet recommendation: Regular diet.  Filed Weights   01/24/15 1914  Weight: 76.204 kg (168 lb)    History of present illness:  Patient is a 64 year old with breast cancer who presented to the hospital with neutropenic fever. Horse of infection identified while patient hospitalized.  Hospital Course:  Principal Problem:   Neutropenic fever -Patient has had 4 days of antibiotic treatment. And has defervescence on this. Her white blood cell count is within normal levels with last check at 4.5 -We'll discharge on 3 more days of Levaquin to complete a seven-day antibiotic treatment course. - Recommend patient follow-up with oncologist within the next 2-5 days   Active Problems:   Hypertrophic obstructive cardiomyopathy (HOCM) - Patient to follow up with cardiologist routinely as recommended by them on last visit    Breast cancer of upper-inner quadrant of left female breast - Followed by oncology as outpatient of which patient will continue to follow-up after discharge.  Procedures:  None  Consultations:  None  Discharge Exam: Filed Vitals:   01/27/15 0446  BP: 111/56  Pulse: 88  Temp: 99.5 F (37.5 C)  Resp: 18    General: Pt in nad, alert and awake Cardiovascular: rrr, no mrg Respiratory: cta bl, no wheezes  Discharge Instructions   Discharge Instructions    Call MD for:  difficulty breathing, headache or visual  disturbances    Complete by:  As directed      Call MD for:  temperature >100.4    Complete by:  As directed      Diet - low sodium heart healthy    Complete by:  As directed      Discharge instructions    Complete by:  As directed   Patient to follow up with her oncologist within the next week for further evaluation and recommendations.     Increase activity slowly    Complete by:  As directed           Current Discharge Medication List    START taking these medications   Details  levofloxacin (LEVAQUIN) 500 MG tablet Take 1 tablet (500 mg total) by mouth daily. Qty: 3 tablet, Refills: 0      CONTINUE these medications which have NOT CHANGED   Details  acetaminophen (TYLENOL) 325 MG tablet Take 650 mg by mouth every 6 (six) hours as needed (scratchy throat).     dexamethasone (DECADRON) 4 MG tablet Take 2 tablets by mouth once a day on the day after chemotherapy and then take 2 tablets two times a day for 2 days. Take with food. Qty: 30 tablet, Refills: 1   Associated Diagnoses: Breast cancer of upper-inner quadrant of left female breast    HYDROcodone-acetaminophen (NORCO/VICODIN) 5-325 MG per tablet Take 1-2 tablets by mouth every 4 (four) hours as needed for moderate pain. Qty: 30 tablet, Refills: 0    lisinopril (PRINIVIL,ZESTRIL) 20 MG tablet TAKE ONE TABLET BY MOUTH ONCE DAILY Qty: 90 tablet, Refills: 1    LORazepam (ATIVAN)  0.5 MG tablet Take 1 tablet (0.5 mg total) by mouth every 6 (six) hours as needed (Nausea or vomiting). Qty: 30 tablet, Refills: 0   Associated Diagnoses: Breast cancer of upper-inner quadrant of left female breast    metoprolol (LOPRESSOR) 50 MG tablet TAKE ONE TABLET BY MOUTH TWICE DAILY Qty: 180 tablet, Refills: 3    Multiple Vitamin (MULTIVITAMIN) tablet Take 1 tablet by mouth daily.     aspirin 81 MG tablet Take 81 mg by mouth daily.      ondansetron (ZOFRAN) 8 MG tablet Take 1 tablet (8 mg total) by mouth 2 (two) times daily as needed.  Start on the third day after chemotherapy. Qty: 30 tablet, Refills: 1   Associated Diagnoses: Breast cancer of upper-inner quadrant of left female breast      STOP taking these medications     lidocaine-prilocaine (EMLA) cream      prochlorperazine (COMPAZINE) 10 MG tablet        Allergies  Allergen Reactions  . Codeine Itching      The results of significant diagnostics from this hospitalization (including imaging, microbiology, ancillary and laboratory) are listed below for reference.    Significant Diagnostic Studies: Dg Chest 2 View  01/24/2015   CLINICAL DATA:  Initial evaluation for fever and dry cough that began today, current history of breast cancer  EXAM: CHEST  2 VIEW  COMPARISON:  01/11/2015  FINDINGS: Heart size upper normal and stable. Vascular pattern normal. Right PICC line identified with tip just above the cavoatrial junction. No pneumothorax.  No effusion.  No consolidation.  IMPRESSION: No active cardiopulmonary disease.   Electronically Signed   By: Skipper Cliche M.D.   On: 01/24/2015 18:26   Ct Chest W Contrast  01/16/2015   CLINICAL DATA:  Newly diagnosed left upper inner quadrant breast carcinoma. Staging.  EXAM: CT CHEST, ABDOMEN, AND PELVIS WITH CONTRAST  TECHNIQUE: Multidetector CT imaging of the chest, abdomen and pelvis was performed following the standard protocol during bolus administration of intravenous contrast.  CONTRAST:  120mL OMNIPAQUE IOHEXOL 300 MG/ML  SOLN  COMPARISON:  Chest CT only on 12/15/2007 from [facility] radiology  FINDINGS: CT CHEST FINDINGS  Mediastinum/Lymph Nodes: No mediastinal or hilar lymphadenopathy identified. At least 3 left axillary lymph nodes are seen measuring up to 7 mm in short axis which are increased since previous study. Although these are not considered pathologically enlarged, metastatic disease cannot be excluded.  Lungs/Pleura: No pulmonary infiltrate or mass identified. No effusion present.  Musculoskeletal/Soft  Tissues: A 5.5 cm soft tissue mass is seen in the superior left breast, consistent with known primary breast carcinoma.  CT ABDOMEN AND PELVIS FINDINGS  Hepatobiliary: Mild hepatic steatosis is demonstrated, however no liver masses are identified. Gallbladder is unremarkable.  Pancreas: No mass, inflammatory changes, or other significant abnormality identified.  Spleen: A wedge-shaped low-attenuation lesion is seen within the spleen with associated volume loss, consistent with a subacute or chronic splenic infarct. No evidence of splenomegaly or other splenic lesions.  Adrenals:  No masses identified.  Kidneys/Urinary Tract: No evidence of masses or hydronephrosis. Small left renal cysts again noted.  Stomach/Bowel/Peritoneum: No evidence of wall thickening, mass, or obstruction.  Vascular/Lymphatic: No pathologically enlarged lymph nodes identified. No other significant abnormality visualized.  Reproductive:  No mass or other significant abnormality identified.  Other:  Small paraumbilical hernia noted containing only fat.  Musculoskeletal:  No suspicious bone lesions identified.  IMPRESSION: 5.5 cm left breast mass, consistent with known primary breast  carcinoma.  Several left axillary lymph nodes measuring up to 7 mm which are increased since 2009 exam ; lymph node metastases cannot be excluded.  No other sites of metastatic disease identified.  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   Electronically Signed   By: Earle Gell M.D.   On: 01/16/2015 13:07   Nm Bone Scan Whole Body  01/16/2015   CLINICAL DATA:  Known left breast carcinoma, check for possible bony metastatic disease  EXAM: NUCLEAR MEDICINE WHOLE BODY BONE SCAN  TECHNIQUE: Whole body anterior and posterior images were obtained approximately 3 hours after intravenous injection of radiopharmaceutical.  RADIOPHARMACEUTICALS:  Twenty-five mCi Technetium-99 MDP  COMPARISON:  None  FINDINGS: There is adequate uptake of radioactive  tracer throughout the bony skeleton. Bilateral renal activity with pooling in the urinary bladder is seen. Increased symmetrical uptake is noted in the sternoclavicular joints bilaterally consistent with degenerative change. Some mild degenerative changes also noted in the superior aspect of the left hip joint anteriorly. No focal area of increased activity to suggest metastatic disease is noted. Very mild S-shaped scoliosis of the thoracolumbar spine is seen as well.  IMPRESSION: Degenerative change in the sternoclavicular joints and left hip joint. No definitive changes of metastatic disease are seen.   Electronically Signed   By: Inez Catalina M.D.   On: 01/16/2015 12:49   Ct Abdomen Pelvis W Contrast  01/16/2015   CLINICAL DATA:  Newly diagnosed left upper inner quadrant breast carcinoma. Staging.  EXAM: CT CHEST, ABDOMEN, AND PELVIS WITH CONTRAST  TECHNIQUE: Multidetector CT imaging of the chest, abdomen and pelvis was performed following the standard protocol during bolus administration of intravenous contrast.  CONTRAST:  140mL OMNIPAQUE IOHEXOL 300 MG/ML  SOLN  COMPARISON:  Chest CT only on 12/15/2007 from [facility] radiology  FINDINGS: CT CHEST FINDINGS  Mediastinum/Lymph Nodes: No mediastinal or hilar lymphadenopathy identified. At least 3 left axillary lymph nodes are seen measuring up to 7 mm in short axis which are increased since previous study. Although these are not considered pathologically enlarged, metastatic disease cannot be excluded.  Lungs/Pleura: No pulmonary infiltrate or mass identified. No effusion present.  Musculoskeletal/Soft Tissues: A 5.5 cm soft tissue mass is seen in the superior left breast, consistent with known primary breast carcinoma.  CT ABDOMEN AND PELVIS FINDINGS  Hepatobiliary: Mild hepatic steatosis is demonstrated, however no liver masses are identified. Gallbladder is unremarkable.  Pancreas: No mass, inflammatory changes, or other significant abnormality identified.   Spleen: A wedge-shaped low-attenuation lesion is seen within the spleen with associated volume loss, consistent with a subacute or chronic splenic infarct. No evidence of splenomegaly or other splenic lesions.  Adrenals:  No masses identified.  Kidneys/Urinary Tract: No evidence of masses or hydronephrosis. Small left renal cysts again noted.  Stomach/Bowel/Peritoneum: No evidence of wall thickening, mass, or obstruction.  Vascular/Lymphatic: No pathologically enlarged lymph nodes identified. No other significant abnormality visualized.  Reproductive:  No mass or other significant abnormality identified.  Other:  Small paraumbilical hernia noted containing only fat.  Musculoskeletal:  No suspicious bone lesions identified.  IMPRESSION: 5.5 cm left breast mass, consistent with known primary breast carcinoma.  Several left axillary lymph nodes measuring up to 7 mm which are increased since 2009 exam ; lymph node metastases cannot be excluded.  No other sites of metastatic disease identified.  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   Electronically Signed   By: Earle Gell M.D.   On: 01/16/2015 13:07   Mr  Breast Bilateral W Wo Contrast  01/06/2015   CLINICAL DATA:  Invasive mammary carcinoma superior left breast for staging  LABS:  BUN and creatinine were obtained on site at [facility] at  [address]  Results:  BUN 5 mg/dL,  Creatinine 0.8 mg/dL.  EXAM: BILATERAL BREAST MRI WITH AND WITHOUT CONTRAST  TECHNIQUE: Multiplanar, multisequence MR images of both breasts were obtained prior to and following the intravenous administration of 50ml of MultiHance.  THREE-DIMENSIONAL MR IMAGE RENDERING ON INDEPENDENT WORKSTATION:  Three-dimensional MR images were rendered by post-processing of the original MR data on an independent workstation. The three-dimensional MR images were interpreted, and findings are reported in the following complete MRI report for this study. Three dimensional  images were evaluated at the independent DynaCad workstation  COMPARISON:  Previous exams  FINDINGS: Breast composition: b.  Scattered fibroglandular tissue.  Background parenchymal enhancement: Mild  Right breast: No mass or abnormal enhancement.  Left breast: There is a large enhancing mass in the 10:00 to 12:00 position of the left breast. It demonstrates rapid enhancement with a mixture of plateau, progressive, and washout kinetics. There is evidence of internal necrosis. The mass extends from the posterior through the middle third of the left breast. Near the superior most aspect of the left breast, the mass extends essentially from chest wall to the dermis, causing convexity of the overlying dermis. Although enhancement closely approaches the pectoralis fascia, there is no evidence of pectoralis fascia enhancement to suggest chest wall involvement.  There is thickening and enhancement of the dermis of the left breast superiorly and medially, as well as of the dermis overlying the nipple areolar complex. Toward the inferior aspect of the mass, there is a linear focus of enhancement extending from the anterior edge of the mass all the way to the nipple over a distance of 4.5 cm.  Lymph nodes: There are several bilateral axillary lymph nodes. The 2 most prominent are located in the mid axilla on the left. The more medial of the 2 measures 11 mm and shows no normal hilum. Superior and lateral to this is a 28 mm lymph node with the cortex measuring up to 10 mm.  Ancillary findings:  None.  IMPRESSION: Large mass in the 10 o'clock through 12 o'clock position of the left breast consistent with the diagnosis of invasive carcinoma. There is left breast skin thickening and enhancement involving the superior and medial left breast as well as the nipple areolar complex, and involvement of the skin with malignancy is not excluded. Additionally, there is non mass like linear enhancement extending from the anterior inferior  aspect of the mass all the way into the nipple. There are 2 left axillary lymph nodes which demonstrate an appearance suggesting the possibility of metastasis.  RECOMMENDATION: Proceed with appropriate treatment planning. The possibility of dermal involvement with malignancy including the nipple is not excluded. Possibilities for further evaluation would include skin punch biopsy. MRI guided biopsy of linear non mass like enhancement extending from the anterior inferior aspect of the mass toward the nipple could also be considered.  BI-RADS CATEGORY  6: Known biopsy-proven malignancy.   Electronically Signed   By: Skipper Cliche M.D.   On: 01/06/2015 15:41   Ir Fluoro Guide Cv Line Right  01/16/2015   CLINICAL DATA:  Breast cancer  EXAM: RIGHT PICC LINE PLACEMENT WITH ULTRASOUND AND FLUOROSCOPIC GUIDANCE  FLUOROSCOPY TIME:  12 seconds  PROCEDURE: The patient was advised of the possible risks and complications  and agreed to undergo the procedure. The patient was then brought to the angiographic suite for the procedure.  The right arm was prepped with chlorhexidine, draped in the usual sterile fashion using maximum barrier technique (cap and mask, sterile gown, sterile gloves, large sterile sheet, hand hygiene and cutaneous antisepsis) and infiltrated locally with 1% Lidocaine.  Ultrasound demonstrated patency of the right basilic vein, and this was documented with an image. Under real-time ultrasound guidance, this vein was accessed with a 21 gauge micropuncture needle and image documentation was performed. A 0.018 wire was introduced in to the vein. Over this, a 5 [place] double lumen Power PICC was advanced to the lower SVC/right atrial junction. Fluoroscopy during the procedure and fluoro spot radiograph confirms appropriate catheter position. The catheter was flushed and covered with a sterile dressing.  COMPLICATIONS: None  LENGTH: 39 cm  IMPRESSION: Successful right arm Power PICC line placement with  ultrasound and fluoroscopic guidance. The catheter is ready for use.   Electronically Signed   By: Maryclare Bean M.D.   On: 01/16/2015 08:59   Ir US Guide Vasc Access Right  01/16/2015   CLINICAL DATA:  Breast cancer  EXAM: RIGHT PICC LINE PLACEMENT WITH ULTRASOUND AND FLUOROSCOPIC GUIDANCE  FLUOROSCOPY TIME:  12 seconds  PROCEDURE: The patient was advised of the possible risks and complications and agreed to undergo the procedure. The patient was then brought to the angiographic suite for the procedure.  The right arm was prepped with chlorhexidine, draped in the usual sterile fashion using maximum barrier technique (cap and mask, sterile gown, sterile gloves, large sterile sheet, hand hygiene and cutaneous antisepsis) and infiltrated locally with 1% Lidocaine.  Ultrasound demonstrated patency of the right basilic vein, and this was documented with an image. Under real-time ultrasound guidance, this vein was accessed with a 21 gauge micropuncture needle and image documentation was performed. A 0.018 wire was introduced in to the vein. Over this, a 5 [place] double lumen Power PICC was advanced to the lower SVC/right atrial junction. Fluoroscopy during the procedure and fluoro spot radiograph confirms appropriate catheter position. The catheter was flushed and covered with a sterile dressing.  COMPLICATIONS: None  LENGTH: 39 cm  IMPRESSION: Successful right arm Power PICC line placement with ultrasound and fluoroscopic guidance. The catheter is ready for use.   Electronically Signed   By: Maryclare Bean M.D.   On: 01/16/2015 08:59   Dg Chest Port 1 View  01/11/2015   CLINICAL DATA:  Attempted right central line placement, history of breast carcinoma  EXAM: PORTABLE CHEST - 1 VIEW  COMPARISON:  CT chest of 12/15/2007  FINDINGS: No pneumothorax is seen. The lungs are clear with only minimal basilar volume loss present. Mediastinal and hilar contours are unremarkable. The heart is within normal limits in size.  IMPRESSION:  No active lung disease.  No pneumothorax.   Electronically Signed   By: Ivar Drape M.D.   On: 01/11/2015 10:05    Microbiology: Recent Results (from the past 240 hour(s))  Culture, blood (routine x 2)     Status: None (Preliminary result)   Collection Time: 01/24/15  5:29 PM  Result Value Ref Range Status   Specimen Description BLOOD PICC LINE  Final   Special Requests BOTTLES DRAWN AEROBIC AND ANAEROBIC 5CC EACH  Final   Culture   Final           BLOOD CULTURE RECEIVED NO GROWTH TO DATE CULTURE WILL BE HELD FOR 5 DAYS BEFORE ISSUING  A FINAL NEGATIVE REPORT Performed at [facility]    Report Status PENDING  Incomplete  Culture, blood (routine x 2)     Status: None (Preliminary result)   Collection Time: 01/24/15  5:30 PM  Result Value Ref Range Status   Specimen Description BLOOD LEFT ANTECUBITAL  Final   Special Requests BOTTLES DRAWN AEROBIC AND ANAEROBIC 5CC EACH  Final   Culture   Final           BLOOD CULTURE RECEIVED NO GROWTH TO DATE CULTURE WILL BE HELD FOR 5 DAYS BEFORE ISSUING A FINAL NEGATIVE REPORT Performed at [facility]    Report Status PENDING  Incomplete  Urine culture     Status: None   Collection Time: 01/24/15  6:34 PM  Result Value Ref Range Status   Specimen Description URINE, CLEAN CATCH  Final   Special Requests Normal  Final   Colony Count   Final    50,000 COLONIES/ML Performed at [facility]    Culture   Final    Multiple bacterial morphotypes present, none predominant. Suggest appropriate recollection if clinically indicated. Performed at [facility]    Report Status 01/26/2015 FINAL  Final     Labs: Basic Metabolic Panel:  Recent Labs Lab 01/23/15 1414 01/24/15 1729 01/25/15 0500  NA 137 136 138  K 4.4 4.0 4.2  CL  --  102 107  CO2 26 24 25   GLUCOSE 147* 132* 124*  BUN 10.9 11 8   CREATININE 0.7 0.62 0.67  CALCIUM 8.9 9.1 8.3*   Liver Function Tests:  Recent Labs Lab 01/23/15 1414  01/24/15 1729 01/25/15 0500  AST 8 12 12   ALT 12 13 12   ALKPHOS 77 60 53  BILITOT 0.64 0.4 0.5  PROT 5.8* 6.1 5.5*  ALBUMIN 2.9* 3.3* 2.9*   No results for input(s): LIPASE, AMYLASE in the last 168 hours. No results for input(s): AMMONIA in the last 168 hours. CBC:  Recent Labs Lab 01/23/15 1414 01/24/15 1729 01/25/15 0500 01/26/15 0840  WBC 1.0* 1.2* 1.8* 4.5  NEUTROABS 0.1* 0.1* 0.5*  --   HGB 13.5 12.5 11.6* 11.2*  HCT 40.9 37.8 35.3* 33.2*  MCV 97.1 95.9 96.7 97.1  PLT 57* 57* 67* 100*   Cardiac Enzymes: No results for input(s): CKTOTAL, CKMB, CKMBINDEX, TROPONINI in the last 168 hours. BNP: BNP (last 3 results) No results for input(s): BNP in the last 8760 hours.  ProBNP (last 3 results) No results for input(s): PROBNP in the last 8760 hours.  CBG: No results for input(s): GLUCAP in the last 168 hours.     Signed:  Velvet Bathe  [facility] 01/27/2015, 10:55 AM

## 2015-01-30 ENCOUNTER — Telehealth: Payer: Self-pay | Admitting: Hematology and Oncology

## 2015-01-30 ENCOUNTER — Other Ambulatory Visit (HOSPITAL_BASED_OUTPATIENT_CLINIC_OR_DEPARTMENT_OTHER): Payer: Medicaid Other

## 2015-01-30 ENCOUNTER — Ambulatory Visit (HOSPITAL_BASED_OUTPATIENT_CLINIC_OR_DEPARTMENT_OTHER): Payer: Medicaid Other | Admitting: Hematology and Oncology

## 2015-01-30 ENCOUNTER — Other Ambulatory Visit: Payer: Self-pay

## 2015-01-30 VITALS — BP 115/58 | HR 101 | Temp 98.2°F | Resp 18 | Ht 63.0 in | Wt 166.5 lb

## 2015-01-30 DIAGNOSIS — R5383 Other fatigue: Secondary | ICD-10-CM

## 2015-01-30 DIAGNOSIS — C50212 Malignant neoplasm of upper-inner quadrant of left female breast: Secondary | ICD-10-CM

## 2015-01-30 DIAGNOSIS — C773 Secondary and unspecified malignant neoplasm of axilla and upper limb lymph nodes: Secondary | ICD-10-CM

## 2015-01-30 LAB — COMPREHENSIVE METABOLIC PANEL (CC13)
ALBUMIN: 2.7 g/dL — AB (ref 3.5–5.0)
ALK PHOS: 69 U/L (ref 40–150)
ALT: 9 U/L (ref 0–55)
ANION GAP: 11 meq/L (ref 3–11)
AST: 10 U/L (ref 5–34)
BUN: 7.4 mg/dL (ref 7.0–26.0)
CHLORIDE: 107 meq/L (ref 98–109)
CO2: 24 meq/L (ref 22–29)
Calcium: 9.5 mg/dL (ref 8.4–10.4)
Creatinine: 0.7 mg/dL (ref 0.6–1.1)
EGFR: 85 mL/min/{1.73_m2} — AB (ref >90)
Glucose: 170 mg/dl — ABNORMAL HIGH (ref 70–140)
Potassium: 4.3 mEq/L (ref 3.5–5.1)
Sodium: 141 mEq/L (ref 136–145)
Total Bilirubin: <0.2 mg/dL (ref 0.20–1.20)
Total Protein: 6 g/dL — ABNORMAL LOW (ref 6.4–8.3)

## 2015-01-30 LAB — CBC WITH DIFFERENTIAL/PLATELET
BASO%: 0.4 % (ref 0.0–2.0)
Basophils Absolute: 0.1 10*3/uL (ref 0.0–0.1)
EOS%: 0.4 % (ref 0.0–7.0)
Eosinophils Absolute: 0.1 10*3/uL (ref 0.0–0.5)
HCT: 35.5 % (ref 34.8–46.6)
HEMOGLOBIN: 11.8 g/dL (ref 11.6–15.9)
LYMPH#: 2.4 10*3/uL (ref 0.9–3.3)
LYMPH%: 19.7 % (ref 14.0–49.7)
MCH: 32.2 pg (ref 25.1–34.0)
MCHC: 33.2 g/dL (ref 31.5–36.0)
MCV: 97 fL (ref 79.5–101.0)
MONO#: 0.8 10*3/uL (ref 0.1–0.9)
MONO%: 6.3 % (ref 0.0–14.0)
NEUT#: 8.7 10*3/uL — ABNORMAL HIGH (ref 1.5–6.5)
NEUT%: 73.2 % (ref 38.4–76.8)
PLATELETS: 399 10*3/uL (ref 145–400)
RBC: 3.66 10*6/uL — ABNORMAL LOW (ref 3.70–5.45)
RDW: 13.7 % (ref 11.2–14.5)
WBC: 11.9 10*3/uL — ABNORMAL HIGH (ref 3.9–10.3)

## 2015-01-30 NOTE — Telephone Encounter (Signed)
, °

## 2015-01-30 NOTE — Assessment & Plan Note (Signed)
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 Neutropenic fever admission after cycle 1 which lead to dose reduction 2.    Return to clinic in 2 weeks for cycle 3.

## 2015-01-30 NOTE — Progress Notes (Signed)
Patient Care Team: Antony Blackbird, MD as PCP - General (Family Medicine)  DIAGNOSIS: Breast cancer of upper-inner quadrant of left female breast   Staging form: Breast, AJCC 7th Edition     Clinical: Stage IIIB (T4b, N0, M0) - Signed by Rulon Eisenmenger, MD on 01/17/2015   SUMMARY OF ONCOLOGIC HISTORY:   Breast cancer of upper-inner quadrant of left female breast   12/27/2014 Initial Diagnosis Grade 3 invasive mammary cancer, one lymph node negative in left axilla, ER 0%, PR 0%, Ki-67 98%, HER-2 negative ratio 1.38   01/06/2015 Breast MRI Left breast large enhancing mass, evidence of necrosis, extending from posterior through the middle third of left breast, extending from chest wall to the dermis, close to pectoralis fascia extending 4.5 cm, several bilateral axillary lymph nodes   01/17/2015 -  Neo-Adjuvant Chemotherapy Neoadjuvant chemotherapy with dose dense Adriamycin and Cytoxan followed by Taxol and carboplatin   01/24/2015 - 01/27/2015 Hospital Admission Neutropenic fever admission, no source identified    CHIEF COMPLIANT: Tomorrow if patient gets cycle 2 of chemotherapy with Adriamycin and Cytoxan, recent hospitalization follow-up  INTERVAL HISTORY: Patty Buckley is a 64 year old lady with above-mentioned history of left-sided breast cancer currently on neoadjuvant chemotherapy with dose dense Adriamycin and Cytoxan. After cycle 1, day 7 patient developed fever of 100.8 and was hospitalized with neutropenia. Several tests were conducted all of which were negative. She was finally discharged home her blood counts have improved. She also experienced severe fatigue with chemotherapy the week after chemotherapy. She was also having side effects to dexamethasone.  REVIEW OF SYSTEMS:   Constitutional: Denies fevers, chills or abnormal weight loss Eyes: Denies blurriness of vision Ears, nose, mouth, throat, and face: Denies mucositis or sore throat Respiratory: Denies cough, dyspnea or  wheezes Cardiovascular: Denies palpitation, chest discomfort or lower extremity swelling Gastrointestinal:  Denies nausea, heartburn or change in bowel habits Skin: Denies abnormal skin rashes Lymphatics: Denies new lymphadenopathy or easy bruising Neurological:Denies numbness, tingling or new weaknesses Behavioral/Psych: Mood is stable, no new changes  All other systems were reviewed with the patient and are negative.  I have reviewed the past medical history, past surgical history, social history and family history with the patient and they are unchanged from previous note.  ALLERGIES:  is allergic to codeine.  MEDICATIONS:  Current Outpatient Prescriptions  Medication Sig Dispense Refill  . acetaminophen (TYLENOL) 325 MG tablet Take 650 mg by mouth every 6 (six) hours as needed (scratchy throat).     Marland Kitchen aspirin 81 MG tablet Take 81 mg by mouth daily.      Marland Kitchen dexamethasone (DECADRON) 4 MG tablet Take 2 tablets by mouth once a day on the day after chemotherapy and then take 2 tablets two times a day for 2 days. Take with food. 30 tablet 1  . HYDROcodone-acetaminophen (NORCO/VICODIN) 5-325 MG per tablet Take 1-2 tablets by mouth every 4 (four) hours as needed for moderate pain. 30 tablet 0  . levofloxacin (LEVAQUIN) 500 MG tablet Take 1 tablet (500 mg total) by mouth daily. 3 tablet 0  . lisinopril (PRINIVIL,ZESTRIL) 20 MG tablet TAKE ONE TABLET BY MOUTH ONCE DAILY 90 tablet 1  . LORazepam (ATIVAN) 0.5 MG tablet Take 1 tablet (0.5 mg total) by mouth every 6 (six) hours as needed (Nausea or vomiting). 30 tablet 0  . metoprolol (LOPRESSOR) 50 MG tablet TAKE ONE TABLET BY MOUTH TWICE DAILY 180 tablet 3  . Multiple Vitamin (MULTIVITAMIN) tablet Take 1 tablet by mouth daily.     Marland Kitchen  ondansetron (ZOFRAN) 8 MG tablet Take 1 tablet (8 mg total) by mouth 2 (two) times daily as needed. Start on the third day after chemotherapy. 30 tablet 1   No current facility-administered medications for this visit.     PHYSICAL EXAMINATION: ECOG PERFORMANCE STATUS: 1 - Symptomatic but completely ambulatory  Filed Vitals:   01/30/15 1512  BP: 115/58  Pulse: 101  Temp: 98.2 F (36.8 C)  Resp: 18   Filed Weights   01/30/15 1512  Weight: 166 lb 8 oz (75.524 kg)    GENERAL:alert, no distress and comfortable SKIN: skin color, texture, turgor are normal, no rashes or significant lesions EYES: normal, Conjunctiva are pink and non-injected, sclera clear OROPHARYNX:no exudate, no erythema and lips, buccal mucosa, and tongue normal  NECK: supple, thyroid normal size, non-tender, without nodularity LYMPH:  no palpable lymphadenopathy in the cervical, axillary or inguinal LUNGS: clear to auscultation and percussion with normal breathing effort HEART: regular rate & rhythm and no murmurs and no lower extremity edema ABDOMEN:abdomen soft, non-tender and normal bowel sounds Musculoskeletal:no cyanosis of digits and no clubbing  NEURO: alert & oriented x 3 with fluent speech, no focal motor/sensory deficits  LABORATORY DATA:  I have reviewed the data as listed   Chemistry      Component Value Date/Time   NA 141 01/30/2015 1457   NA 138 01/25/2015 0500   K 4.3 01/30/2015 1457   K 4.2 01/25/2015 0500   CL 107 01/25/2015 0500   CO2 24 01/30/2015 1457   CO2 25 01/25/2015 0500   BUN 7.4 01/30/2015 1457   BUN 8 01/25/2015 0500   CREATININE 0.7 01/30/2015 1457   CREATININE 0.67 01/25/2015 0500      Component Value Date/Time   CALCIUM 9.5 01/30/2015 1457   CALCIUM 8.3* 01/25/2015 0500   ALKPHOS 69 01/30/2015 1457   ALKPHOS 53 01/25/2015 0500   AST 10 01/30/2015 1457   AST 12 01/25/2015 0500   ALT 9 01/30/2015 1457   ALT 12 01/25/2015 0500   BILITOT <0.20 01/30/2015 1457   BILITOT 0.5 01/25/2015 0500       Lab Results  Component Value Date   WBC 11.9* 01/30/2015   HGB 11.8 01/30/2015   HCT 35.5 01/30/2015   MCV 97.0 01/30/2015   PLT 399 01/30/2015   NEUTROABS 8.7* 01/30/2015     ASSESSMENT & PLAN:  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 Neutropenic fever admission after cycle 1. I debated whether or not we need to dose reduce, at this point I have elected to keep the dosage of chemotherapy the same for cycle 2. 2. Severe fatigue the week after chemotherapy 3. Denies any nausea vomiting mucositis or any other concerns   Return to clinic in 2 weeks for cycle 3.  No orders of the defined types were placed in this encounter.   The patient has a good understanding of the overall plan. she agrees with it. She will call with any problems that may develop before her next visit here.   Rulon Eisenmenger, MD

## 2015-01-31 ENCOUNTER — Other Ambulatory Visit: Payer: Self-pay

## 2015-01-31 ENCOUNTER — Ambulatory Visit (HOSPITAL_BASED_OUTPATIENT_CLINIC_OR_DEPARTMENT_OTHER): Payer: Medicaid Other

## 2015-01-31 ENCOUNTER — Telehealth: Payer: Self-pay | Admitting: *Deleted

## 2015-01-31 DIAGNOSIS — C773 Secondary and unspecified malignant neoplasm of axilla and upper limb lymph nodes: Secondary | ICD-10-CM

## 2015-01-31 DIAGNOSIS — Z5111 Encounter for antineoplastic chemotherapy: Secondary | ICD-10-CM

## 2015-01-31 DIAGNOSIS — C50212 Malignant neoplasm of upper-inner quadrant of left female breast: Secondary | ICD-10-CM

## 2015-01-31 LAB — CULTURE, BLOOD (ROUTINE X 2)
CULTURE: NO GROWTH
Culture: NO GROWTH

## 2015-01-31 MED ORDER — PALONOSETRON HCL INJECTION 0.25 MG/5ML
0.2500 mg | Freq: Once | INTRAVENOUS | Status: AC
  Administered 2015-01-31: 0.25 mg via INTRAVENOUS

## 2015-01-31 MED ORDER — PALONOSETRON HCL INJECTION 0.25 MG/5ML
INTRAVENOUS | Status: AC
  Filled 2015-01-31: qty 5

## 2015-01-31 MED ORDER — DEXAMETHASONE SODIUM PHOSPHATE 20 MG/5ML IJ SOLN
12.0000 mg | Freq: Once | INTRAMUSCULAR | Status: AC
  Administered 2015-01-31: 12 mg via INTRAVENOUS

## 2015-01-31 MED ORDER — SODIUM CHLORIDE 0.9 % IV SOLN
Freq: Once | INTRAVENOUS | Status: AC
  Administered 2015-01-31: 10:00:00 via INTRAVENOUS

## 2015-01-31 MED ORDER — DEXAMETHASONE SODIUM PHOSPHATE 20 MG/5ML IJ SOLN
INTRAMUSCULAR | Status: AC
  Filled 2015-01-31: qty 5

## 2015-01-31 MED ORDER — DOXORUBICIN HCL CHEMO IV INJECTION 2 MG/ML
60.0000 mg/m2 | Freq: Once | INTRAVENOUS | Status: AC
  Administered 2015-01-31: 110 mg via INTRAVENOUS
  Filled 2015-01-31: qty 55

## 2015-01-31 MED ORDER — PEGFILGRASTIM 6 MG/0.6ML ~~LOC~~ PSKT
6.0000 mg | PREFILLED_SYRINGE | Freq: Once | SUBCUTANEOUS | Status: AC
  Administered 2015-01-31: 6 mg via SUBCUTANEOUS
  Filled 2015-01-31: qty 0.6

## 2015-01-31 MED ORDER — SODIUM CHLORIDE 0.9 % IV SOLN
150.0000 mg | Freq: Once | INTRAVENOUS | Status: AC
  Administered 2015-01-31: 150 mg via INTRAVENOUS
  Filled 2015-01-31: qty 5

## 2015-01-31 MED ORDER — SODIUM CHLORIDE 0.9 % IV SOLN
600.0000 mg/m2 | Freq: Once | INTRAVENOUS | Status: AC
  Administered 2015-01-31: 1100 mg via INTRAVENOUS
  Filled 2015-01-31: qty 55

## 2015-01-31 NOTE — Progress Notes (Signed)
1205: Right PICC line d/c'd per order. Vaseline gauze dressing applied to site, entire length of catheter removed and intact at 39 inches.  Pt monitored for 30 minutes, notified to keep dressing in place for 24 hours and pt verbalized understanding. Dressing clean, dry, and intact.

## 2015-01-31 NOTE — Patient Instructions (Addendum)
Port placement 3/1.  Arrive at [facility].  Nothing to eat or drink after midnight.  You will need a driver.       [facility] Discharge Instructions for Patients Receiving Chemotherapy  Today you received the following chemotherapy agents Adriamycin and Cytoxan  To help prevent nausea and vomiting after your treatment, we encourage you to take your nausea medication Ativan 0.5 mg every 6 hours or Zofran 8 mg twice a day (3rd day after chemo)   If you develop nausea and vomiting that is not controlled by your nausea medication, call the clinic.   BELOW ARE SYMPTOMS THAT SHOULD BE REPORTED IMMEDIATELY:  *FEVER GREATER THAN 100.5 F  *CHILLS WITH OR WITHOUT FEVER  NAUSEA AND VOMITING THAT IS NOT CONTROLLED WITH YOUR NAUSEA MEDICATION  *UNUSUAL SHORTNESS OF BREATH  *UNUSUAL BRUISING OR BLEEDING  TENDERNESS IN MOUTH AND THROAT WITH OR WITHOUT PRESENCE OF ULCERS  *URINARY PROBLEMS  *BOWEL PROBLEMS  UNUSUAL RASH Items with * indicate a potential emergency and should be followed up as soon as possible.  Feel free to call the clinic you have any questions or concerns. The clinic phone number is [phone].

## 2015-01-31 NOTE — Telephone Encounter (Signed)
Per staff message and POF I have scheduled appts. Advised scheduler of appts. JMW  

## 2015-01-31 NOTE — Progress Notes (Signed)
Scheduled for 02/07/15.  Let infusion RN know to inform pt.  Instructions placed on AVS.

## 2015-02-06 ENCOUNTER — Other Ambulatory Visit: Payer: Self-pay | Admitting: Radiology

## 2015-02-07 ENCOUNTER — Encounter (HOSPITAL_COMMUNITY): Payer: Self-pay

## 2015-02-07 ENCOUNTER — Ambulatory Visit (HOSPITAL_COMMUNITY)
Admission: RE | Admit: 2015-02-07 | Discharge: 2015-02-07 | Disposition: A | Discharge status: Home / Self Care | Payer: Medicaid Other | Source: Ambulatory Visit | Attending: Interventional Radiology | Admitting: Interventional Radiology

## 2015-02-07 ENCOUNTER — Ambulatory Visit (HOSPITAL_COMMUNITY)
Admission: RE | Admit: 2015-02-07 | Discharge: 2015-02-07 | Disposition: A | Discharge status: Home / Self Care | Payer: Medicaid Other | Source: Ambulatory Visit | Attending: Surgery | Admitting: Surgery

## 2015-02-07 DIAGNOSIS — C50212 Malignant neoplasm of upper-inner quadrant of left female breast: Secondary | ICD-10-CM | POA: Insufficient documentation

## 2015-02-07 LAB — CBC WITH DIFFERENTIAL/PLATELET
BASOS ABS: 0 10*3/uL (ref 0.0–0.1)
Basophils Relative: 4 % — ABNORMAL HIGH (ref 0–1)
Eosinophils Absolute: 0 10*3/uL (ref 0.0–0.7)
Eosinophils Relative: 1 % (ref 0–5)
HCT: 33.6 % — ABNORMAL LOW (ref 36.0–46.0)
HEMOGLOBIN: 11.1 g/dL — AB (ref 12.0–15.0)
Lymphocytes Relative: 90 % — ABNORMAL HIGH (ref 12–46)
Lymphs Abs: 1 10*3/uL (ref 0.7–4.0)
MCH: 31.4 pg (ref 26.0–34.0)
MCHC: 33 g/dL (ref 30.0–36.0)
MCV: 94.9 fL (ref 78.0–100.0)
MONO ABS: 0 10*3/uL — AB (ref 0.1–1.0)
MONOS PCT: 4 % (ref 3–12)
NEUTROS PCT: 1 % — AB (ref 43–77)
Neutro Abs: 0 10*3/uL — ABNORMAL LOW (ref 1.7–7.7)
PLATELETS: 146 10*3/uL — AB (ref 150–400)
RBC: 3.54 MIL/uL — ABNORMAL LOW (ref 3.87–5.11)
RDW: 13.7 % (ref 11.5–15.5)
WBC: 1 10*3/uL — AB (ref 4.0–10.5)

## 2015-02-07 LAB — PROTIME-INR
INR: 0.95 (ref 0.00–1.49)
PROTHROMBIN TIME: 12.8 s (ref 11.6–15.2)

## 2015-02-07 MED ORDER — CEFAZOLIN SODIUM-DEXTROSE 2-3 GM-% IV SOLR: 2.0000 g | Freq: Once | INTRAVENOUS | Status: DC

## 2015-02-07 MED ORDER — SODIUM CHLORIDE 0.9 % IV SOLN
INTRAVENOUS | Status: DC
  Administered 2015-02-07: 500 mL via INTRAVENOUS

## 2015-02-07 NOTE — Progress Notes (Signed)
CRITICAL VALUE ALERT  Critical value received:  WBC 1.0  Date of notification:  02-07-2015  Time of notification:  12:41  Critical value read back: yes  Nurse who received alert:  Lambert Mody RT (R) (M) (CV)   MD notified (1st page):  Dr Annamaria Boots  Time of first page: 12:43  Responding MD:  Dr Annamaria Boots  Time MD responded:  12:45

## 2015-02-07 NOTE — Progress Notes (Signed)
CRITICAL VALUE ALERT  Critical value received:  WBC  Date of notification:  02/07/15  Time of notification:  9791  Critical value read back:Yes.    Nurse who received alert:  Gaynelle Cage RN  MD notified (1st page):  Rowe Robert PA from IR  Time of first page:  1252  MD notified (2nd page):  Time of second page:  Responding MD:  Rowe Robert PA  Time MD responded:  1256

## 2015-02-09 ENCOUNTER — Other Ambulatory Visit: Payer: Self-pay | Admitting: Radiology

## 2015-02-10 ENCOUNTER — Other Ambulatory Visit: Payer: Self-pay | Admitting: Radiology

## 2015-02-13 ENCOUNTER — Other Ambulatory Visit: Payer: Self-pay

## 2015-02-13 ENCOUNTER — Encounter (HOSPITAL_COMMUNITY): Payer: Self-pay

## 2015-02-13 ENCOUNTER — Ambulatory Visit (HOSPITAL_COMMUNITY)
Admission: RE | Admit: 2015-02-13 | Discharge: 2015-02-13 | Disposition: A | Discharge status: Home / Self Care | Payer: Medicaid Other | Source: Ambulatory Visit | Attending: Interventional Radiology | Admitting: Interventional Radiology

## 2015-02-13 ENCOUNTER — Ambulatory Visit (HOSPITAL_COMMUNITY)
Admission: RE | Admit: 2015-02-13 | Discharge: 2015-02-13 | Disposition: A | Discharge status: Home / Self Care | Payer: Medicaid Other | Source: Ambulatory Visit | Attending: Surgery | Admitting: Surgery

## 2015-02-13 ENCOUNTER — Telehealth: Payer: Self-pay | Admitting: *Deleted

## 2015-02-13 DIAGNOSIS — E785 Hyperlipidemia, unspecified: Secondary | ICD-10-CM | POA: Insufficient documentation

## 2015-02-13 DIAGNOSIS — C50212 Malignant neoplasm of upper-inner quadrant of left female breast: Secondary | ICD-10-CM

## 2015-02-13 DIAGNOSIS — E559 Vitamin D deficiency, unspecified: Secondary | ICD-10-CM | POA: Insufficient documentation

## 2015-02-13 DIAGNOSIS — Z7982 Long term (current) use of aspirin: Secondary | ICD-10-CM | POA: Diagnosis not present

## 2015-02-13 DIAGNOSIS — K219 Gastro-esophageal reflux disease without esophagitis: Secondary | ICD-10-CM | POA: Insufficient documentation

## 2015-02-13 DIAGNOSIS — F419 Anxiety disorder, unspecified: Secondary | ICD-10-CM | POA: Diagnosis not present

## 2015-02-13 DIAGNOSIS — I517 Cardiomegaly: Secondary | ICD-10-CM | POA: Insufficient documentation

## 2015-02-13 DIAGNOSIS — Z79899 Other long term (current) drug therapy: Secondary | ICD-10-CM | POA: Insufficient documentation

## 2015-02-13 DIAGNOSIS — M8580 Other specified disorders of bone density and structure, unspecified site: Secondary | ICD-10-CM | POA: Diagnosis not present

## 2015-02-13 DIAGNOSIS — Q213 Tetralogy of Fallot: Secondary | ICD-10-CM | POA: Diagnosis not present

## 2015-02-13 DIAGNOSIS — I10 Essential (primary) hypertension: Secondary | ICD-10-CM | POA: Diagnosis not present

## 2015-02-13 DIAGNOSIS — C50919 Malignant neoplasm of unspecified site of unspecified female breast: Secondary | ICD-10-CM | POA: Insufficient documentation

## 2015-02-13 DIAGNOSIS — F17210 Nicotine dependence, cigarettes, uncomplicated: Secondary | ICD-10-CM | POA: Insufficient documentation

## 2015-02-13 LAB — CBC
HCT: 38.7 % (ref 36.0–46.0)
HEMOGLOBIN: 12.5 g/dL (ref 12.0–15.0)
MCH: 31.3 pg (ref 26.0–34.0)
MCHC: 32.3 g/dL (ref 30.0–36.0)
MCV: 96.8 fL (ref 78.0–100.0)
PLATELETS: 228 10*3/uL (ref 150–400)
RBC: 4 MIL/uL (ref 3.87–5.11)
RDW: 14.7 % (ref 11.5–15.5)
WBC: 13.1 10*3/uL — ABNORMAL HIGH (ref 4.0–10.5)

## 2015-02-13 LAB — BASIC METABOLIC PANEL
ANION GAP: 8 (ref 5–15)
BUN: 8 mg/dL (ref 6–23)
CALCIUM: 9 mg/dL (ref 8.4–10.5)
CO2: 25 mmol/L (ref 19–32)
Chloride: 107 mmol/L (ref 96–112)
Creatinine, Ser: 0.74 mg/dL (ref 0.50–1.10)
GFR calc non Af Amer: 88 mL/min — ABNORMAL LOW (ref >90)
Glucose, Bld: 108 mg/dL — ABNORMAL HIGH (ref 70–99)
Potassium: 4.3 mmol/L (ref 3.5–5.1)
Sodium: 140 mmol/L (ref 135–145)

## 2015-02-13 LAB — APTT: aPTT: 27 seconds (ref 24–37)

## 2015-02-13 LAB — PROTIME-INR
INR: 0.97 (ref 0.00–1.49)
Prothrombin Time: 12.9 seconds (ref 11.6–15.2)

## 2015-02-13 MED ORDER — CEFAZOLIN SODIUM-DEXTROSE 2-3 GM-% IV SOLR
INTRAVENOUS | Status: AC
  Filled 2015-02-13: qty 50

## 2015-02-13 MED ORDER — CEFAZOLIN SODIUM-DEXTROSE 2-3 GM-% IV SOLR
2.0000 g | Freq: Once | INTRAVENOUS | Status: AC
  Administered 2015-02-13: 2 g via INTRAVENOUS

## 2015-02-13 MED ORDER — FENTANYL CITRATE 0.05 MG/ML IJ SOLN
INTRAMUSCULAR | Status: AC | PRN
Start: 2015-02-13 — End: 2015-02-13
  Administered 2015-02-13: 25 ug via INTRAVENOUS
  Administered 2015-02-13: 50 ug via INTRAVENOUS
  Administered 2015-02-13: 25 ug via INTRAVENOUS

## 2015-02-13 MED ORDER — FENTANYL CITRATE 0.05 MG/ML IJ SOLN
INTRAMUSCULAR | Status: AC
  Filled 2015-02-13: qty 4

## 2015-02-13 MED ORDER — SODIUM CHLORIDE 0.9 % IV SOLN
Freq: Once | INTRAVENOUS | Status: AC
  Administered 2015-02-13: 13:00:00 via INTRAVENOUS

## 2015-02-13 MED ORDER — HEPARIN SOD (PORK) LOCK FLUSH 100 UNIT/ML IV SOLN
INTRAVENOUS | Status: AC
  Filled 2015-02-13: qty 5

## 2015-02-13 MED ORDER — LIDOCAINE-EPINEPHRINE 2 %-1:100000 IJ SOLN
INTRAMUSCULAR | Status: DC
Start: 2015-02-13 — End: 2015-02-14
  Filled 2015-02-13: qty 1

## 2015-02-13 MED ORDER — MIDAZOLAM HCL 2 MG/2ML IJ SOLN
INTRAMUSCULAR | Status: AC | PRN
  Administered 2015-02-13: 1 mg via INTRAVENOUS
  Administered 2015-02-13 (×2): 0.5 mg via INTRAVENOUS

## 2015-02-13 MED ORDER — MIDAZOLAM HCL 2 MG/2ML IJ SOLN
INTRAMUSCULAR | Status: AC
  Filled 2015-02-13: qty 6

## 2015-02-13 MED ORDER — HEPARIN SOD (PORK) LOCK FLUSH 100 UNIT/ML IV SOLN
INTRAVENOUS | Status: AC | PRN
  Administered 2015-02-13: 500 [IU]

## 2015-02-13 NOTE — Telephone Encounter (Signed)
I have adjusted 3/8 appt

## 2015-02-13 NOTE — H&P (Signed)
Chief Complaint: "I am here to have a port placed."  Referring Physician(s): Gudena,Vinay/ Dr. Georgette Dover  History of Present Illness: Patty Buckley is a 64 y.o. female with left breast cancer s/p unsuccessful attempt at right subclavian surgical port placement. The patient has been referred to IR for image guided port placement. She was seen by IR on 01/13/15 and her right chest incision from port attempt was noted to be mildly erythematous and swollen, PICC line was placed and she was rescheduled for today. She states her right chest incision site has healed well. She denies any chest pain, shortness of breath or palpitations. She denies any active signs of bleeding or excessive bruising. She denies any recent fever or chills. The patient denies any history of sleep apnea or chronic oxygen use. She has previously tolerated sedation without complications.    Past Medical History  Diagnosis Date  . Hyperlipidemia   . Hypertension   . Left ventricular outflow obstruction   . Cigarette smoker   . Vitamin D deficiency   . Osteopenia   . GERD (gastroesophageal reflux disease)   . Hemorrhoids   . Hernia   . Ventricular hypertrophy   . Wears glasses   . Anxiety     Past Surgical History  Procedure Laterality Date  . Tonsillectomy    . Tubal ligation    . US echocardiography  04/24/2007    EF 55-60%  . Cardiovascular stress test  05/07/2007    EF 82% NO ISCHEMIA  . Wisdom tooth extraction    . Portacath placement Right 01/11/2015    Procedure: INSERTION PORT-A-CATH;  Surgeon: Donnie Mesa, MD;  Location: [facility];  Service: General;  Laterality: Right;     Attempted  failed to implant,     Allergies: Codeine  Medications: Prior to Admission medications   Medication Sig Start Date End Date Taking? Authorizing Provider  aspirin 81 MG tablet Take 81 mg by mouth daily.     Yes Historical Provider, MD  dexamethasone (DECADRON) 4 MG tablet Take 2 tablets by mouth  once a day on the day after chemotherapy and then take 2 tablets two times a day for 2 days. Take with food. Patient taking differently: Take 4 mg by mouth 2 (two) times daily. Takes 1 tablet twice daily  For 2 days after chemo 01/09/15  Yes Nicholas Lose, MD  HYDROcodone-acetaminophen (NORCO/VICODIN) 5-325 MG per tablet Take 1-2 tablets by mouth every 4 (four) hours as needed for moderate pain. 01/11/15  Yes Donnie Mesa, MD  lisinopril (PRINIVIL,ZESTRIL) 30 MG tablet Take 30 mg by mouth every morning.   Yes Historical Provider, MD  LORazepam (ATIVAN) 0.5 MG tablet Take 1 tablet (0.5 mg total) by mouth every 6 (six) hours as needed (Nausea or vomiting). 01/18/15  Yes Nicholas Lose, MD  metoprolol (LOPRESSOR) 50 MG tablet TAKE ONE TABLET BY MOUTH TWICE DAILY 11/21/14  Yes Thayer Headings, MD  Multiple Vitamin (MULTIVITAMIN) tablet Take 1 tablet by mouth daily.    Yes Historical Provider, MD  ondansetron (ZOFRAN) 8 MG tablet Take 1 tablet (8 mg total) by mouth 2 (two) times daily as needed. Start on the third day after chemotherapy. 01/09/15  Yes Nicholas Lose, MD  levofloxacin (LEVAQUIN) 500 MG tablet Take 1 tablet (500 mg total) by mouth daily. Patient not taking: Reported on 02/09/2015 01/28/15   Velvet Bathe, MD  lisinopril (PRINIVIL,ZESTRIL) 20 MG tablet TAKE ONE TABLET BY MOUTH ONCE DAILY Patient not taking:  Reported on 02/09/2015 11/01/14   Thayer Headings, MD     Family History  Problem Relation Age of Onset  . Hypertension Mother   . Heart attack Mother   . Heart failure Father   . Hypertension Father     History   Social History  . Marital Status: Divorced    Spouse Name: N/A  . Number of Children: N/A  . Years of Education: N/A   Social History Main Topics  . Smoking status: Current Every Day Smoker -- 1.00 packs/day  . Smokeless tobacco: Not on file  . Alcohol Use: Yes     Comment: wine occassionally  . Drug Use: No  . Sexual Activity: Yes    Birth Control/ Protection: Surgical    Other Topics Concern  . None   Social History Narrative    Review of Systems: A 12 point ROS discussed and pertinent positives are indicated in the HPI above.  All other systems are negative.  Review of Systems  Vital Signs: BP 112/62 mmHg  Pulse 94  Temp(Src) 98.2 F (36.8 C) (Oral)  Resp 16  SpO2 100%  Physical Exam  Constitutional: She is oriented to person, place, and time. No distress.  HENT:  Head: Normocephalic and atraumatic.  Neck: No tracheal deviation present.  Cardiovascular: Normal rate and regular rhythm.  Exam reveals no gallop and no friction rub.   No murmur heard. Pulmonary/Chest: Effort normal and breath sounds normal. No respiratory distress. She has no wheezes. She has no rales.  Abdominal: Soft. Bowel sounds are normal.  Neurological: She is alert and oriented to person, place, and time.  Skin: Skin is warm and dry. She is not diaphoretic. No erythema.    Mallampati Score:  MD Evaluation Airway: WNL Heart: WNL Abdomen: WNL Chest/ Lungs: WNL ASA  Classification: 3 Mallampati/Airway Score: Two  Imaging: Dg Chest 2 View  01/24/2015   CLINICAL DATA:  Initial evaluation for fever and dry cough that began today, current history of breast cancer  EXAM: CHEST  2 VIEW  COMPARISON:  01/11/2015  FINDINGS: Heart size upper normal and stable. Vascular pattern normal. Right PICC line identified with tip just above the cavoatrial junction. No pneumothorax.  No effusion.  No consolidation.  IMPRESSION: No active cardiopulmonary disease.   Electronically Signed   By: Skipper Cliche M.D.   On: 01/24/2015 18:26   Ct Chest W Contrast  01/16/2015   CLINICAL DATA:  Newly diagnosed left upper inner quadrant breast carcinoma. Staging.  EXAM: CT CHEST, ABDOMEN, AND PELVIS WITH CONTRAST  TECHNIQUE: Multidetector CT imaging of the chest, abdomen and pelvis was performed following the standard protocol during bolus administration of intravenous contrast.  CONTRAST:  174mL  OMNIPAQUE IOHEXOL 300 MG/ML  SOLN  COMPARISON:  Chest CT only on 12/15/2007 from [facility] radiology  FINDINGS: CT CHEST FINDINGS  Mediastinum/Lymph Nodes: No mediastinal or hilar lymphadenopathy identified. At least 3 left axillary lymph nodes are seen measuring up to 7 mm in short axis which are increased since previous study. Although these are not considered pathologically enlarged, metastatic disease cannot be excluded.  Lungs/Pleura: No pulmonary infiltrate or mass identified. No effusion present.  Musculoskeletal/Soft Tissues: A 5.5 cm soft tissue mass is seen in the superior left breast, consistent with known primary breast carcinoma.  CT ABDOMEN AND PELVIS FINDINGS  Hepatobiliary: Mild hepatic steatosis is demonstrated, however no liver masses are identified. Gallbladder is unremarkable.  Pancreas: No mass, inflammatory changes, or other significant abnormality identified.  Spleen:  A wedge-shaped low-attenuation lesion is seen within the spleen with associated volume loss, consistent with a subacute or chronic splenic infarct. No evidence of splenomegaly or other splenic lesions.  Adrenals:  No masses identified.  Kidneys/Urinary Tract: No evidence of masses or hydronephrosis. Small left renal cysts again noted.  Stomach/Bowel/Peritoneum: No evidence of wall thickening, mass, or obstruction.  Vascular/Lymphatic: No pathologically enlarged lymph nodes identified. No other significant abnormality visualized.  Reproductive:  No mass or other significant abnormality identified.  Other:  Small paraumbilical hernia noted containing only fat.  Musculoskeletal:  No suspicious bone lesions identified.  IMPRESSION: 5.5 cm left breast mass, consistent with known primary breast carcinoma.  Several left axillary lymph nodes measuring up to 7 mm which are increased since 2009 exam ; lymph node metastases cannot be excluded.  No other sites of metastatic disease identified.  Subacute versus chronic splenic infarct  incidentally noted.   Electronically Signed   By: Earle Gell M.D.   On: 01/16/2015 13:07   Nm Bone Scan Whole Body  01/16/2015   CLINICAL DATA:  Known left breast carcinoma, check for possible bony metastatic disease  EXAM: NUCLEAR MEDICINE WHOLE BODY BONE SCAN  TECHNIQUE: Whole body anterior and posterior images were obtained approximately 3 hours after intravenous injection of radiopharmaceutical.  RADIOPHARMACEUTICALS:  Twenty-five mCi Technetium-99 MDP  COMPARISON:  None  FINDINGS: There is adequate uptake of radioactive tracer throughout the bony skeleton. Bilateral renal activity with pooling in the urinary bladder is seen. Increased symmetrical uptake is noted in the sternoclavicular joints bilaterally consistent with degenerative change. Some mild degenerative changes also noted in the superior aspect of the left hip joint anteriorly. No focal area of increased activity to suggest metastatic disease is noted. Very mild S-shaped scoliosis of the thoracolumbar spine is seen as well.  IMPRESSION: Degenerative change in the sternoclavicular joints and left hip joint. No definitive changes of metastatic disease are seen.   Electronically Signed   By: Inez Catalina M.D.   On: 01/16/2015 12:49   Ct Abdomen Pelvis W Contrast  01/16/2015   CLINICAL DATA:  Newly diagnosed left upper inner quadrant breast carcinoma. Staging.  EXAM: CT CHEST, ABDOMEN, AND PELVIS WITH CONTRAST  TECHNIQUE: Multidetector CT imaging of the chest, abdomen and pelvis was performed following the standard protocol during bolus administration of intravenous contrast.  CONTRAST:  145mL OMNIPAQUE IOHEXOL 300 MG/ML  SOLN  COMPARISON:  Chest CT only on 12/15/2007 from [facility] radiology  FINDINGS: CT CHEST FINDINGS  Mediastinum/Lymph Nodes: No mediastinal or hilar lymphadenopathy identified. At least 3 left axillary lymph nodes are seen measuring up to 7 mm in short axis which are increased since previous study. Although these are not  considered pathologically enlarged, metastatic disease cannot be excluded.  Lungs/Pleura: No pulmonary infiltrate or mass identified. No effusion present.  Musculoskeletal/Soft Tissues: A 5.5 cm soft tissue mass is seen in the superior left breast, consistent with known primary breast carcinoma.  CT ABDOMEN AND PELVIS FINDINGS  Hepatobiliary: Mild hepatic steatosis is demonstrated, however no liver masses are identified. Gallbladder is unremarkable.  Pancreas: No mass, inflammatory changes, or other significant abnormality identified.  Spleen: A wedge-shaped low-attenuation lesion is seen within the spleen with associated volume loss, consistent with a subacute or chronic splenic infarct. No evidence of splenomegaly or other splenic lesions.  Adrenals:  No masses identified.  Kidneys/Urinary Tract: No evidence of masses or hydronephrosis. Small left renal cysts again noted.  Stomach/Bowel/Peritoneum: No evidence of wall thickening, mass, or obstruction.  Vascular/Lymphatic: No pathologically enlarged lymph nodes identified. No other significant abnormality visualized.  Reproductive:  No mass or other significant abnormality identified.  Other:  Small paraumbilical hernia noted containing only fat.  Musculoskeletal:  No suspicious bone lesions identified.  IMPRESSION: 5.5 cm left breast mass, consistent with known primary breast carcinoma.  Several left axillary lymph nodes measuring up to 7 mm which are increased since 2009 exam ; lymph node metastases cannot be excluded.  No other sites of metastatic disease identified.  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   Electronically Signed   By: Earle Gell M.D.   On: 01/16/2015 13:07   Ir Fluoro Guide Cv Line Right  01/16/2015   CLINICAL DATA:  Breast cancer  EXAM: RIGHT PICC LINE PLACEMENT WITH ULTRASOUND AND FLUOROSCOPIC GUIDANCE  FLUOROSCOPY TIME:  12 seconds  PROCEDURE: The patient was advised of the possible risks and complications and agreed  to undergo the procedure. The patient was then brought to the angiographic suite for the procedure.  The right arm was prepped with chlorhexidine, draped in the usual sterile fashion using maximum barrier technique (cap and mask, sterile gown, sterile gloves, large sterile sheet, hand hygiene and cutaneous antisepsis) and infiltrated locally with 1% Lidocaine.  Ultrasound demonstrated patency of the right basilic vein, and this was documented with an image. Under real-time ultrasound guidance, this vein was accessed with a 21 gauge micropuncture needle and image documentation was performed. A 0.018 wire was introduced in to the vein. Over this, a 5 [place] double lumen Power PICC was advanced to the lower SVC/right atrial junction. Fluoroscopy during the procedure and fluoro spot radiograph confirms appropriate catheter position. The catheter was flushed and covered with a sterile dressing.  COMPLICATIONS: None  LENGTH: 39 cm  IMPRESSION: Successful right arm Power PICC line placement with ultrasound and fluoroscopic guidance. The catheter is ready for use.   Electronically Signed   By: Maryclare Bean M.D.   On: 01/16/2015 08:59   Ir US Guide Vasc Access Right  01/16/2015   CLINICAL DATA:  Breast cancer  EXAM: RIGHT PICC LINE PLACEMENT WITH ULTRASOUND AND FLUOROSCOPIC GUIDANCE  FLUOROSCOPY TIME:  12 seconds  PROCEDURE: The patient was advised of the possible risks and complications and agreed to undergo the procedure. The patient was then brought to the angiographic suite for the procedure.  The right arm was prepped with chlorhexidine, draped in the usual sterile fashion using maximum barrier technique (cap and mask, sterile gown, sterile gloves, large sterile sheet, hand hygiene and cutaneous antisepsis) and infiltrated locally with 1% Lidocaine.  Ultrasound demonstrated patency of the right basilic vein, and this was documented with an image. Under real-time ultrasound guidance, this vein was accessed with a 21 gauge  micropuncture needle and image documentation was performed. A 0.018 wire was introduced in to the vein. Over this, a 5 [place] double lumen Power PICC was advanced to the lower SVC/right atrial junction. Fluoroscopy during the procedure and fluoro spot radiograph confirms appropriate catheter position. The catheter was flushed and covered with a sterile dressing.  COMPLICATIONS: None  LENGTH: 39 cm  IMPRESSION: Successful right arm Power PICC line placement with ultrasound and fluoroscopic guidance. The catheter is ready for use.   Electronically Signed   By: Maryclare Bean M.D.   On: 01/16/2015 08:59    Labs:  CBC:  Recent Labs  01/26/15 0840 01/30/15 1457 02/07/15 1210 02/13/15 1130  WBC 4.5 11.9* 1.0* 13.1*  HGB 11.2* 11.8 11.1* 12.5  HCT 33.2* 35.5 33.6* 38.7  PLT 100* 399 146* 228    COAGS:  Recent Labs  01/13/15 1242 02/07/15 1210 02/13/15 1130  INR 0.94 0.95 0.97  APTT 26  --  27    BMP:  Recent Labs  01/13/15 1242  01/24/15 1729 01/25/15 0500 01/30/15 1457 02/13/15 1130  NA 142  < > 136 138 141 140  K 4.0  < > 4.0 4.2 4.3 4.3  CL 108  --  102 107  --  107  CO2 24  < > 24 25 24 25   GLUCOSE 94  < > 132* 124* 170* 108*  BUN 11  < > 11 8 7.4 8  CALCIUM 9.2  < > 9.1 8.3* 9.5 9.0  CREATININE 0.77  < > 0.62 0.67 0.7 0.74  GFRNONAA 87*  --  >90 >90  --  88*  GFRAA >90  --  >90 >90  --  >90  < > = values in this interval not displayed.  LIVER FUNCTION TESTS:  Recent Labs  01/23/15 1414 01/24/15 1729 01/25/15 0500 01/30/15 1457  BILITOT 0.64 0.4 0.5 <0.20  AST 8 12 12 10   ALT 12 13 12 9   ALKPHOS 77 60 53 69  PROT 5.8* 6.1 5.5* 6.0*  ALBUMIN 2.9* 3.3* 2.9* 2.7*   Assessment and Plan: Left breast cancer Seen by Dr. Georgette Dover with unsuccessful attempt at surgically placed right subclavian port Referred to IR for image guided port a catheter placement with moderate sedation, see previous H&P on 01/13/15 The patient has been NPO, no blood thinners taken, labs and  vitals have been reviewed. Risks and Benefits discussed with the patient including, but not limited to bleeding, infection, pneumothorax, or fibrin sheath development and need for additional procedures. All of the patient's questions were answered, patient is agreeable to proceed. Consent signed and in chart. Leukocytosis on decadron and was given Neulasta for neutropenia, afebrile.    Thank you for this interesting consult.  I greatly enjoyed meeting VINNIE BOBST and look forward to participating in their care.  SignedHedy Jacob 02/13/2015, 1:42 PM   I spent a total of 20 Minutes in face to face in clinical consultation, greater than 50% of which was counseling/coordinating care for left breast cancer.

## 2015-02-13 NOTE — Procedures (Signed)
Successful placement of right IJ approach port-a-cath with tip at the superior caval atrial junction. The catheter is ready for immediate use. No immediate post procedural complications. 

## 2015-02-13 NOTE — Discharge Instructions (Signed)
Implanted Port Insertion, Care After Call [facility] for any problems or questions [phone]. No change in medications or diet. Light activity remainder of day. Leave dressing for 24 hour. Then may shower. Do not submerge for 2 weeks.    Refer to this sheet in the next few weeks. These instructions provide you with information on caring for yourself after your procedure. Your health care provider may also give you more specific instructions. Your treatment has been planned according to current medical practices, but problems sometimes occur. Call your health care provider if you have any problems or questions after your procedure. WHAT TO EXPECT AFTER THE PROCEDURE After your procedure, it is typical to have the following:   Discomfort at the port insertion site. Ice packs to the area will help.  Bruising on the skin over the port. This will subside in 3-4 days. HOME CARE INSTRUCTIONS  After your port is placed, you will get a manufacturer's information card. The card has information about your port. Keep this card with you at all times.   Know what kind of port you have. There are many types of ports available.   Wear a medical alert bracelet in case of an emergency. This can help alert health care workers that you have a port.   The port can stay in for as long as your health care provider believes it is necessary.   A home health care nurse may give medicines and take care of the port.   You or a family member can get special training and directions for giving medicine and taking care of the port at home.  SEEK MEDICAL CARE IF:   Your port does not flush or you are unable to get a blood return.   You have a fever or chills. SEEK IMMEDIATE MEDICAL CARE IF:  You have new fluid or pus coming from your incision.   You notice a bad smell coming from your incision site.   You have swelling, pain, or more redness at the incision or port site.   You have chest pain  or shortness of breath. Document Released: 09/15/2013 Document Revised: 11/30/2013 Document Reviewed: 09/15/2013 [facility] Patient Information 2015 [place], [place]. This information is not intended to replace advice given to you by your health care provider. Make sure you discuss any questions you have with your health care provider. Implanted [facility] Guide An implanted port is a type of central line that is placed under the skin. Central lines are used to provide IV access when treatment or nutrition needs to be given through a person's veins. Implanted ports are used for long-term IV access. An implanted port may be placed because:   You need IV medicine that would be irritating to the small veins in your hands or arms.   You need long-term IV medicines, such as antibiotics.   You need IV nutrition for a long period.   You need frequent blood draws for lab tests.   You need dialysis.  Implanted ports are usually placed in the chest area, but they can also be placed in the upper arm, the abdomen, or the leg. An implanted port has two main parts:   Reservoir. The reservoir is round and will appear as a small, raised area under your skin. The reservoir is the part where a needle is inserted to give medicines or draw blood.   Catheter. The catheter is a thin, flexible tube that extends from the reservoir. The catheter is placed  into a large vein. Medicine that is inserted into the reservoir goes into the catheter and then into the vein.  HOW WILL I CARE FOR MY INCISION SITE? Do not get the incision site wet. Bathe or shower as directed by your health care provider.  HOW IS MY PORT ACCESSED? Special steps must be taken to access the port:   Before the port is accessed, a numbing cream can be placed on the skin. This helps numb the skin over the port site.   Your health care provider uses a sterile technique to access the port.  Your health care provider must put on a mask and  sterile gloves.  The skin over your port is cleaned carefully with an antiseptic and allowed to dry.  The port is gently pinched between sterile gloves, and a needle is inserted into the port.  Only "non-coring" port needles should be used to access the port. Once the port is accessed, a blood return should be checked. This helps ensure that the port is in the vein and is not clogged.   If your port needs to remain accessed for a constant infusion, a clear (transparent) bandage will be placed over the needle site. The bandage and needle will need to be changed every week, or as directed by your health care provider.   Keep the bandage covering the needle clean and dry. Do not get it wet. Follow your health care provider's instructions on how to take a shower or bath while the port is accessed.   If your port does not need to stay accessed, no bandage is needed over the port.  WHAT IS FLUSHING? Flushing helps keep the port from getting clogged. Follow your health care provider's instructions on how and when to flush the port. Ports are usually flushed with saline solution or a medicine called heparin. The need for flushing will depend on how the port is used.   If the port is used for intermittent medicines or blood draws, the port will need to be flushed:   After medicines have been given.   After blood has been drawn.   As part of routine maintenance.   If a constant infusion is running, the port may not need to be flushed.  HOW LONG WILL MY PORT STAY IMPLANTED? The port can stay in for as long as your health care provider thinks it is needed. When it is time for the port to come out, surgery will be done to remove it. The procedure is similar to the one performed when the port was put in.  WHEN SHOULD I SEEK IMMEDIATE MEDICAL CARE? When you have an implanted port, you should seek immediate medical care if:   You notice a bad smell coming from the incision site.   You have  swelling, redness, or drainage at the incision site.   You have more swelling or pain at the port site or the surrounding area.   You have a fever that is not controlled with medicine. Document Released: 11/25/2005 Document Revised: 09/15/2013 Document Reviewed: 08/02/2013 [facility] Patient Information 2015 [place], [place]. This information is not intended to replace advice given to you by your health care provider. Make sure you discuss any questions you have with your health care provider. Conscious Sedation Sedation is the use of medicines to promote relaxation and relieve discomfort and anxiety. Conscious sedation is a type of sedation. Under conscious sedation you are less alert than normal but are still able to respond to  instructions or stimulation. Conscious sedation is used during short medical and dental procedures. It is milder than deep sedation or general anesthesia and allows you to return to your regular activities sooner.  LET [facility] CARE PROVIDER KNOW ABOUT:   Any allergies you have.  All medicines you are taking, including vitamins, herbs, eye drops, creams, and over-the-counter medicines.  Use of steroids (by mouth or creams).  Previous problems you or members of your family have had with the use of anesthetics.  Any blood disorders you have.  Previous surgeries you have had.  Medical conditions you have.  Possibility of pregnancy, if this applies.  Use of cigarettes, alcohol, or illegal drugs. RISKS AND COMPLICATIONS Generally, this is a safe procedure. However, as with any procedure, problems can occur. Possible problems include:  Oversedation.  Trouble breathing on your own. You may need to have a breathing tube until you are awake and breathing on your own.  Allergic reaction to any of the medicines used for the procedure. BEFORE THE PROCEDURE  You may have blood tests done. These tests can help show how well your kidneys and liver are working. They  can also show how well your blood clots.  A physical exam will be done.  Only take medicines as directed by your health care provider. You may need to stop taking medicines (such as blood thinners, aspirin, or nonsteroidal anti-inflammatory drugs) before the procedure.   Do not eat or drink at least 6 hours before the procedure or as directed by your health care provider.  Arrange for a responsible adult, family member, or friend to take you home after the procedure. He or she should stay with you for at least 24 hours after the procedure, until the medicine has worn off. PROCEDURE   An intravenous (IV) catheter will be inserted into one of your veins. Medicine will be able to flow directly into your body through this catheter. You may be given medicine through this tube to help prevent pain and help you relax.  The medical or dental procedure will be done. AFTER THE PROCEDURE  You will stay in a recovery area until the medicine has worn off. Your blood pressure and pulse will be checked.   Depending on the procedure you had, you may be allowed to go home when you can tolerate liquids and your pain is under control. Document Released: 08/20/2001 Document Revised: 11/30/2013 Document Reviewed: 08/02/2013 [facility] Patient Information 2015 [place], [place]. This information is not intended to replace advice given to you by your health care provider. Make sure you discuss any questions you have with your health care provider.

## 2015-02-14 ENCOUNTER — Ambulatory Visit (HOSPITAL_BASED_OUTPATIENT_CLINIC_OR_DEPARTMENT_OTHER): Payer: Medicaid Other | Admitting: Hematology and Oncology

## 2015-02-14 ENCOUNTER — Ambulatory Visit (HOSPITAL_COMMUNITY): Payer: Medicaid Other

## 2015-02-14 ENCOUNTER — Other Ambulatory Visit: Payer: Self-pay

## 2015-02-14 ENCOUNTER — Telehealth: Payer: Self-pay | Admitting: Hematology and Oncology

## 2015-02-14 ENCOUNTER — Other Ambulatory Visit (HOSPITAL_BASED_OUTPATIENT_CLINIC_OR_DEPARTMENT_OTHER): Payer: Medicaid Other

## 2015-02-14 ENCOUNTER — Ambulatory Visit (HOSPITAL_BASED_OUTPATIENT_CLINIC_OR_DEPARTMENT_OTHER): Payer: Medicaid Other

## 2015-02-14 VITALS — BP 106/63 | HR 83 | Temp 97.5°F | Resp 18 | Ht 63.0 in | Wt 162.9 lb

## 2015-02-14 DIAGNOSIS — Z5189 Encounter for other specified aftercare: Secondary | ICD-10-CM

## 2015-02-14 DIAGNOSIS — C50212 Malignant neoplasm of upper-inner quadrant of left female breast: Secondary | ICD-10-CM

## 2015-02-14 DIAGNOSIS — C773 Secondary and unspecified malignant neoplasm of axilla and upper limb lymph nodes: Secondary | ICD-10-CM

## 2015-02-14 DIAGNOSIS — Z5111 Encounter for antineoplastic chemotherapy: Secondary | ICD-10-CM

## 2015-02-14 DIAGNOSIS — Z171 Estrogen receptor negative status [ER-]: Secondary | ICD-10-CM

## 2015-02-14 LAB — COMPREHENSIVE METABOLIC PANEL (CC13)
ALT: 7 U/L (ref 0–55)
ANION GAP: 8 meq/L (ref 3–11)
AST: 13 U/L (ref 5–34)
Albumin: 3.1 g/dL — ABNORMAL LOW (ref 3.5–5.0)
Alkaline Phosphatase: 75 U/L (ref 40–150)
BILIRUBIN TOTAL: 0.31 mg/dL (ref 0.20–1.20)
BUN: 6.7 mg/dL — AB (ref 7.0–26.0)
CHLORIDE: 107 meq/L (ref 98–109)
CO2: 25 meq/L (ref 22–29)
CREATININE: 0.8 mg/dL (ref 0.6–1.1)
Calcium: 9.2 mg/dL (ref 8.4–10.4)
EGFR: 84 mL/min/{1.73_m2} — ABNORMAL LOW (ref >90)
Glucose: 131 mg/dl (ref 70–140)
Potassium: 4.8 mEq/L (ref 3.5–5.1)
Sodium: 140 mEq/L (ref 136–145)
Total Protein: 5.9 g/dL — ABNORMAL LOW (ref 6.4–8.3)

## 2015-02-14 LAB — CBC WITH DIFFERENTIAL/PLATELET
BASO%: 0.4 % (ref 0.0–2.0)
Basophils Absolute: 0 10*3/uL (ref 0.0–0.1)
EOS%: 0.2 % (ref 0.0–7.0)
Eosinophils Absolute: 0 10*3/uL (ref 0.0–0.5)
HEMATOCRIT: 36.2 % (ref 34.8–46.6)
HGB: 11.8 g/dL (ref 11.6–15.9)
LYMPH%: 17.4 % (ref 14.0–49.7)
MCH: 31.8 pg (ref 25.1–34.0)
MCHC: 32.6 g/dL (ref 31.5–36.0)
MCV: 97.6 fL (ref 79.5–101.0)
MONO#: 1 10*3/uL — AB (ref 0.1–0.9)
MONO%: 9.4 % (ref 0.0–14.0)
NEUT#: 7.5 10*3/uL — ABNORMAL HIGH (ref 1.5–6.5)
NEUT%: 72.6 % (ref 38.4–76.8)
PLATELETS: 223 10*3/uL (ref 145–400)
RBC: 3.71 10*6/uL (ref 3.70–5.45)
RDW: 15 % — ABNORMAL HIGH (ref 11.2–14.5)
WBC: 10.4 10*3/uL — AB (ref 3.9–10.3)
lymph#: 1.8 10*3/uL (ref 0.9–3.3)

## 2015-02-14 MED ORDER — SODIUM CHLORIDE 0.9 % IV SOLN
Freq: Once | INTRAVENOUS | Status: AC
  Administered 2015-02-14: 12:00:00 via INTRAVENOUS
  Filled 2015-02-14: qty 5

## 2015-02-14 MED ORDER — SODIUM CHLORIDE 0.9 % IV SOLN
600.0000 mg/m2 | Freq: Once | INTRAVENOUS | Status: AC
  Administered 2015-02-14: 1100 mg via INTRAVENOUS
  Filled 2015-02-14: qty 55

## 2015-02-14 MED ORDER — PEGFILGRASTIM 6 MG/0.6ML ~~LOC~~ PSKT
6.0000 mg | PREFILLED_SYRINGE | Freq: Once | SUBCUTANEOUS | Status: AC
  Administered 2015-02-14: 6 mg via SUBCUTANEOUS
  Filled 2015-02-14: qty 0.6

## 2015-02-14 MED ORDER — SODIUM CHLORIDE 0.9 % IV SOLN
Freq: Once | INTRAVENOUS | Status: AC
  Administered 2015-02-14: 12:00:00 via INTRAVENOUS

## 2015-02-14 MED ORDER — PALONOSETRON HCL INJECTION 0.25 MG/5ML
INTRAVENOUS | Status: AC
  Filled 2015-02-14: qty 5

## 2015-02-14 MED ORDER — HEPARIN SOD (PORK) LOCK FLUSH 100 UNIT/ML IV SOLN
500.0000 [IU] | Freq: Once | INTRAVENOUS | Status: AC | PRN
  Administered 2015-02-14: 500 [IU]
  Filled 2015-02-14: qty 5

## 2015-02-14 MED ORDER — PALONOSETRON HCL INJECTION 0.25 MG/5ML
0.2500 mg | Freq: Once | INTRAVENOUS | Status: AC
  Administered 2015-02-14: 0.25 mg via INTRAVENOUS

## 2015-02-14 MED ORDER — SODIUM CHLORIDE 0.9 % IJ SOLN
10.0000 mL | INTRAMUSCULAR | Status: DC | PRN
  Administered 2015-02-14: 10 mL
  Filled 2015-02-14: qty 10

## 2015-02-14 MED ORDER — DOXORUBICIN HCL CHEMO IV INJECTION 2 MG/ML
60.0000 mg/m2 | Freq: Once | INTRAVENOUS | Status: AC
  Administered 2015-02-14: 110 mg via INTRAVENOUS
  Filled 2015-02-14: qty 55

## 2015-02-14 MED ORDER — LISINOPRIL 10 MG PO TABS: 10.0000 mg | ORAL_TABLET | Freq: Every day | ORAL | Status: DC

## 2015-02-14 NOTE — Patient Instructions (Signed)
[facility] Discharge Instructions for Patients Receiving Chemotherapy  Today you received the following chemotherapy agents: Adriamycin, Cytoxan  To help prevent nausea and vomiting after your treatment, we encourage you to take your nausea medication as prescribed by your physician.   If you develop nausea and vomiting that is not controlled by your nausea medication, call the clinic.   BELOW ARE SYMPTOMS THAT SHOULD BE REPORTED IMMEDIATELY:  *FEVER GREATER THAN 100.5 F  *CHILLS WITH OR WITHOUT FEVER  NAUSEA AND VOMITING THAT IS NOT CONTROLLED WITH YOUR NAUSEA MEDICATION  *UNUSUAL SHORTNESS OF BREATH  *UNUSUAL BRUISING OR BLEEDING  TENDERNESS IN MOUTH AND THROAT WITH OR WITHOUT PRESENCE OF ULCERS  *URINARY PROBLEMS  *BOWEL PROBLEMS  UNUSUAL RASH Items with * indicate a potential emergency and should be followed up as soon as possible.  Feel free to call the clinic you have any questions or concerns. The clinic phone number is [phone].

## 2015-02-14 NOTE — Progress Notes (Signed)
Patient Care Team: Antony Blackbird, MD as PCP - General (Family Medicine)  DIAGNOSIS: Breast cancer of upper-inner quadrant of left female breast   Staging form: Breast, AJCC 7th Edition     Clinical: Stage IIIB (T4b, N0, M0) - Signed by Rulon Eisenmenger, MD on 01/17/2015   SUMMARY OF ONCOLOGIC HISTORY:   Breast cancer of upper-inner quadrant of left female breast   12/27/2014 Initial Diagnosis Grade 3 invasive mammary cancer, one lymph node negative in left axilla, ER 0%, PR 0%, Ki-67 98%, HER-2 negative ratio 1.38   01/06/2015 Breast MRI Left breast large enhancing mass, evidence of necrosis, extending from posterior through the middle third of left breast, extending from chest wall to the dermis, close to pectoralis fascia extending 4.5 cm, several bilateral axillary lymph nodes   01/17/2015 -  Neo-Adjuvant Chemotherapy Neoadjuvant chemotherapy with dose dense Adriamycin and Cytoxan followed by Taxol and carboplatin   01/24/2015 - 01/27/2015 Hospital Admission Neutropenic fever admission, no source identified    CHIEF COMPLIANT: Cycle 3 of chemotherapy with dose dense Adriamycin and Cytoxan  INTERVAL HISTORY: Patty Buckley is a 64 year old lady with above-mentioned history of left breast cancer currently on neoadjuvant chemotherapy. She has tolerated cycle 2 extremely well without any major problems or concerns. She does feel tired for about a week after chemotherapy but it gets better. She denies any nausea or vomiting. She finally got her port placed yesterday. The PICC line has been removed.  REVIEW OF SYSTEMS:   Constitutional: Denies fevers, chills or abnormal weight loss Eyes: Denies blurriness of vision Ears, nose, mouth, throat, and face: Denies mucositis or sore throat Respiratory: Denies cough, dyspnea or wheezes Cardiovascular: Denies palpitation, chest discomfort or lower extremity swelling Gastrointestinal:  Denies nausea, heartburn or change in bowel habits Skin: Denies abnormal  skin rashes Lymphatics: Denies new lymphadenopathy or easy bruising Neurological:Denies numbness, tingling or new weaknesses Behavioral/Psych: Mood is stable, no new changes  Breast:  denies any pain or lumps or nodules in either breasts All other systems were reviewed with the patient and are negative.  I have reviewed the past medical history, past surgical history, social history and family history with the patient and they are unchanged from previous note.  ALLERGIES:  is allergic to codeine.  MEDICATIONS:  Current Outpatient Prescriptions  Medication Sig Dispense Refill  . aspirin 81 MG tablet Take 81 mg by mouth daily.      Marland Kitchen dexamethasone (DECADRON) 4 MG tablet Take 2 tablets by mouth once a day on the day after chemotherapy and then take 2 tablets two times a day for 2 days. Take with food. (Patient taking differently: Take 4 mg by mouth 2 (two) times daily. Takes 1 tablet twice daily  For 2 days after chemo) 30 tablet 1  . HYDROcodone-acetaminophen (NORCO/VICODIN) 5-325 MG per tablet Take 1-2 tablets by mouth every 4 (four) hours as needed for moderate pain. 30 tablet 0  . lisinopril (PRINIVIL,ZESTRIL) 10 MG tablet Take 1 tablet (10 mg total) by mouth daily. Take 10 mg by mouth every morning. 30 tablet 3  . LORazepam (ATIVAN) 0.5 MG tablet Take 1 tablet (0.5 mg total) by mouth every 6 (six) hours as needed (Nausea or vomiting). 30 tablet 0  . metoprolol (LOPRESSOR) 50 MG tablet TAKE ONE TABLET BY MOUTH TWICE DAILY 180 tablet 3  . Multiple Vitamin (MULTIVITAMIN) tablet Take 1 tablet by mouth daily.     . ondansetron (ZOFRAN) 8 MG tablet Take 1 tablet (8 mg total)  by mouth 2 (two) times daily as needed. Start on the third day after chemotherapy. 30 tablet 1   No current facility-administered medications for this visit.    PHYSICAL EXAMINATION: ECOG PERFORMANCE STATUS: 1 - Symptomatic but completely ambulatory  Filed Vitals:   02/14/15 1037  BP: 106/63  Pulse: 83  Temp: 97.5 F  (36.4 C)  Resp: 18   Filed Weights   02/14/15 1037  Weight: 162 lb 14.4 oz (73.891 kg)    GENERAL:alert, no distress and comfortable, alopecia  SKIN: skin color, texture, turgor are normal, no rashes or significant lesions EYES: normal, Conjunctiva are pink and non-injected, sclera clear OROPHARYNX:no exudate, no erythema and lips, buccal mucosa, and tongue normal  NECK: supple, thyroid normal size, non-tender, without nodularity LYMPH:  no palpable lymphadenopathy in the cervical, axillary or inguinal LUNGS: clear to auscultation and percussion with normal breathing effort HEART: regular rate & rhythm and no murmurs and no lower extremity edema ABDOMEN:abdomen soft, non-tender and normal bowel sounds Musculoskeletal:no cyanosis of digits and no clubbing  NEURO: alert & oriented x 3 with fluent speech, no focal motor/sensory deficits  LABORATORY DATA:  I have reviewed the data as listed   Chemistry      Component Value Date/Time   NA 140 02/13/2015 1130   NA 141 01/30/2015 1457   K 4.3 02/13/2015 1130   K 4.3 01/30/2015 1457   CL 107 02/13/2015 1130   CO2 25 02/13/2015 1130   CO2 24 01/30/2015 1457   BUN 8 02/13/2015 1130   BUN 7.4 01/30/2015 1457   CREATININE 0.74 02/13/2015 1130   CREATININE 0.7 01/30/2015 1457      Component Value Date/Time   CALCIUM 9.0 02/13/2015 1130   CALCIUM 9.5 01/30/2015 1457   ALKPHOS 69 01/30/2015 1457   ALKPHOS 53 01/25/2015 0500   AST 10 01/30/2015 1457   AST 12 01/25/2015 0500   ALT 9 01/30/2015 1457   ALT 12 01/25/2015 0500   BILITOT <0.20 01/30/2015 1457   BILITOT 0.5 01/25/2015 0500       Lab Results  Component Value Date   WBC 10.4* 02/14/2015   HGB 11.8 02/14/2015   HCT 36.2 02/14/2015   MCV 97.6 02/14/2015   PLT 223 02/14/2015   NEUTROABS 7.5* 02/14/2015    ASSESSMENT & PLAN:  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 Neutropenic fever admission after cycle 1. I debated whether or not we need to dose reduce, at this point I have elected to keep the dosage of chemotherapy the same for cycle 2. 2. Severe fatigue the week after chemotherapy 3. Denies any nausea vomiting mucositis or any other concerns  Patient is responding well to chemotherapy with decrease in size of the breast and inflammatory changes in the breast Return to clinic in 2 weeks for cycle 4.    No orders of the defined types were placed in this encounter.   The patient has a good understanding of the overall plan. she agrees with it. She will call with any problems that may develop before her next visit here.   Rulon Eisenmenger, MD

## 2015-02-14 NOTE — Telephone Encounter (Signed)
appts made,adrena on 3/21 ok per terri and pt will get an avs at end of chemo today      anne

## 2015-02-14 NOTE — Assessment & Plan Note (Signed)
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 Neutropenic fever admission after cycle 1. I debated whether or not we need to dose reduce, at this point I have elected to keep the dosage of chemotherapy the same for cycle 2. 2. Severe fatigue the week after chemotherapy 3. Denies any nausea vomiting mucositis or any other concerns  Patient is responding well to chemotherapy with decrease in size of the breast and inflammatory changes in the breast Return to clinic in 2 weeks for cycle 4.

## 2015-02-27 ENCOUNTER — Encounter: Payer: Self-pay | Admitting: Physician Assistant

## 2015-02-27 ENCOUNTER — Ambulatory Visit (HOSPITAL_BASED_OUTPATIENT_CLINIC_OR_DEPARTMENT_OTHER): Payer: Medicaid Other | Admitting: Physician Assistant

## 2015-02-27 ENCOUNTER — Other Ambulatory Visit (HOSPITAL_BASED_OUTPATIENT_CLINIC_OR_DEPARTMENT_OTHER): Payer: Medicaid Other

## 2015-02-27 VITALS — BP 127/55 | HR 94 | Temp 98.1°F | Resp 17 | Ht 63.0 in | Wt 162.3 lb

## 2015-02-27 DIAGNOSIS — Z171 Estrogen receptor negative status [ER-]: Secondary | ICD-10-CM | POA: Diagnosis not present

## 2015-02-27 DIAGNOSIS — C50212 Malignant neoplasm of upper-inner quadrant of left female breast: Secondary | ICD-10-CM | POA: Diagnosis not present

## 2015-02-27 LAB — CBC WITH DIFFERENTIAL/PLATELET
BASO%: 1 % (ref 0.0–2.0)
Basophils Absolute: 0.1 10*3/uL (ref 0.0–0.1)
EOS%: 0 % (ref 0.0–7.0)
Eosinophils Absolute: 0 10*3/uL (ref 0.0–0.5)
HEMATOCRIT: 32.9 % — AB (ref 34.8–46.6)
HGB: 10.4 g/dL — ABNORMAL LOW (ref 11.6–15.9)
LYMPH%: 9.4 % — AB (ref 14.0–49.7)
MCH: 31 pg (ref 25.1–34.0)
MCHC: 31.6 g/dL (ref 31.5–36.0)
MCV: 98.2 fL (ref 79.5–101.0)
MONO#: 1.2 10*3/uL — AB (ref 0.1–0.9)
MONO%: 9.7 % (ref 0.0–14.0)
NEUT#: 10.1 10*3/uL — ABNORMAL HIGH (ref 1.5–6.5)
NEUT%: 79.9 % — AB (ref 38.4–76.8)
PLATELETS: 237 10*3/uL (ref 145–400)
RBC: 3.35 10*6/uL — ABNORMAL LOW (ref 3.70–5.45)
RDW: 18.7 % — ABNORMAL HIGH (ref 11.2–14.5)
WBC: 12.6 10*3/uL — ABNORMAL HIGH (ref 3.9–10.3)
lymph#: 1.2 10*3/uL (ref 0.9–3.3)

## 2015-02-27 LAB — COMPREHENSIVE METABOLIC PANEL (CC13)
ALT: 12 U/L (ref 0–55)
AST: 10 U/L (ref 5–34)
Albumin: 3.1 g/dL — ABNORMAL LOW (ref 3.5–5.0)
Alkaline Phosphatase: 70 U/L (ref 40–150)
Anion Gap: 11 mEq/L (ref 3–11)
BUN: 6.8 mg/dL — ABNORMAL LOW (ref 7.0–26.0)
CALCIUM: 9.4 mg/dL (ref 8.4–10.4)
CO2: 25 mEq/L (ref 22–29)
CREATININE: 0.7 mg/dL (ref 0.6–1.1)
Chloride: 107 mEq/L (ref 98–109)
EGFR: >90 mL/min/{1.73_m2} (ref >90)
Glucose: 112 mg/dl (ref 70–140)
POTASSIUM: 4.7 meq/L (ref 3.5–5.1)
Sodium: 142 mEq/L (ref 136–145)
Total Bilirubin: <0.2 mg/dL (ref 0.20–1.20)
Total Protein: 5.9 g/dL — ABNORMAL LOW (ref 6.4–8.3)

## 2015-02-27 NOTE — Progress Notes (Signed)
Patient Care Team: Antony Blackbird, MD as PCP - General (Family Medicine)  DIAGNOSIS: Breast cancer of upper-inner quadrant of left female breast   Staging form: Breast, AJCC 7th Edition     Clinical: Stage IIIB (T4b, N0, M0) - Signed by Rulon Eisenmenger, MD on 01/17/2015   SUMMARY OF ONCOLOGIC HISTORY:   Breast cancer of upper-inner quadrant of left female breast   12/27/2014 Initial Diagnosis Grade 3 invasive mammary cancer, one lymph node negative in left axilla, ER 0%, PR 0%, Ki-67 98%, HER-2 negative ratio 1.38   01/06/2015 Breast MRI Left breast large enhancing mass, evidence of necrosis, extending from posterior through the middle third of left breast, extending from chest wall to the dermis, close to pectoralis fascia extending 4.5 cm, several bilateral axillary lymph nodes   01/17/2015 -  Neo-Adjuvant Chemotherapy Neoadjuvant chemotherapy with dose dense Adriamycin and Cytoxan followed by Taxol and carboplatin   01/24/2015 - 01/27/2015 Hospital Admission Neutropenic fever admission, no source identified    CHIEF COMPLIANT: Cycle 3 of chemotherapy with dose dense Adriamycin and Cytoxan  INTERVAL HISTORY: Patty Buckley is a 64 year old lady with above-mentioned history of left breast cancer currently on neoadjuvant chemotherapy. She has tolerated cycle 3 extremely well without any major problems or concerns. She continues to have significant fatigue for approximately one week after chemotherapy which gradually resolves. She denies any issues with nausea, vomiting, diarrhea or constipation. She denies any fever or chills. She does not notice much change in the size of her breast tumor although feels as if the anterior architecture of the tumor may have decreased a little in size. The area remains tender. She presents to proceed with cycle #4 of dose dense AC as scheduled. 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  REVIEW OF SYSTEMS:    Constitutional: Denies fevers, chills or abnormal weight loss Eyes: Denies blurriness of vision Ears, nose, mouth, throat, and face: Denies mucositis or sore throat Respiratory: Denies cough, dyspnea or wheezes Cardiovascular: Denies palpitation, chest discomfort or lower extremity swelling Gastrointestinal:  Denies nausea, heartburn or change in bowel habits Skin: Denies abnormal skin rashes Lymphatics: Denies new lymphadenopathy or easy bruising Neurological:Denies numbness, tingling or new weaknesses Behavioral/Psych: Mood is stable, no new changes  Breast:  denies any pain or lumps or nodules in either breasts All other systems were reviewed with the patient and are negative.  I have reviewed the past medical history, past surgical history, social history and family history with the patient and they are unchanged from previous note.  ALLERGIES:  is allergic to codeine.  MEDICATIONS:  Current Outpatient Prescriptions  Medication Sig Dispense Refill  . aspirin 81 MG tablet Take 81 mg by mouth daily.      Marland Kitchen dexamethasone (DECADRON) 4 MG tablet Take 2 tablets by mouth once a day on the day after chemotherapy and then take 2 tablets two times a day for 2 days. Take with food. (Patient taking differently: Take 4 mg by mouth 2 (two) times daily. Takes 1 tablet twice daily  For 2 days after chemo) 30 tablet 1  . HYDROcodone-acetaminophen (NORCO/VICODIN) 5-325 MG per tablet Take 1-2 tablets by mouth every 4 (four) hours as needed for moderate pain. 30 tablet 0  . lisinopril (PRINIVIL,ZESTRIL) 10 MG tablet Take 1 tablet (10 mg total) by mouth daily. Take 10 mg by mouth every morning. 30 tablet 3  . metoprolol (LOPRESSOR) 50 MG tablet TAKE ONE  TABLET BY MOUTH TWICE DAILY 180 tablet 3  . Multiple Vitamin (MULTIVITAMIN) tablet Take 1 tablet by mouth daily.     Marland Kitchen LORazepam (ATIVAN) 0.5 MG tablet Take 1 tablet (0.5 mg total) by mouth every 6 (six) hours as needed (Nausea or vomiting). (Patient not  taking: Reported on 02/27/2015) 30 tablet 0  . ondansetron (ZOFRAN) 8 MG tablet Take 1 tablet (8 mg total) by mouth 2 (two) times daily as needed. Start on the third day after chemotherapy. (Patient not taking: Reported on 02/27/2015) 30 tablet 1   No current facility-administered medications for this visit.    PHYSICAL EXAMINATION: ECOG PERFORMANCE STATUS: 1 - Symptomatic but completely ambulatory  Filed Vitals:   02/27/15 1412  BP: 127/55  Pulse: 94  Temp: 98.1 F (36.7 C)  Resp: 17   Filed Weights   02/27/15 1412  Weight: 162 lb 4.8 oz (73.619 kg)    GENERAL:alert, no distress and comfortable, alopecia  SKIN: skin color, texture, turgor are normal, no rashes or significant lesions EYES: normal, Conjunctiva are pink and non-injected, sclera clear OROPHARYNX:no exudate, no erythema and lips, buccal mucosa, and tongue normal  NECK: supple, thyroid normal size, non-tender, without nodularity LYMPH:  no palpable lymphadenopathy in the cervical, axillary or inguinal LUNGS: clear to auscultation and percussion with normal breathing effort HEART: regular rate & rhythm and no murmurs and no lower extremity edema ABDOMEN:abdomen soft, non-tender and normal bowel sounds Musculoskeletal:no cyanosis of digits and no clubbing  NEURO: alert & oriented x 3 with fluent speech, no focal motor/sensory deficits BREAST: Left upper quadrant firm breast mass measuring approximately 3.5 x 3.25 by half-inch raised mass that is tender, erythematous and hyperpigmented, currently not actively bleeding or bruising any secretions no palpable axillary or supraclavicular adenopathy the areola is also hyperpigmented, there is no nipple discharge. Right breast is without skin changes or palpable masses, no nipple discharge, nothing palpable in the right axilla.  LABORATORY DATA:  I have reviewed the data as listed   Chemistry      Component Value Date/Time   NA 142 02/27/2015 1336   NA 140 02/13/2015 1130    K 4.7 02/27/2015 1336   K 4.3 02/13/2015 1130   CL 107 02/13/2015 1130   CO2 25 02/27/2015 1336   CO2 25 02/13/2015 1130   BUN 6.8* 02/27/2015 1336   BUN 8 02/13/2015 1130   CREATININE 0.7 02/27/2015 1336   CREATININE 0.74 02/13/2015 1130      Component Value Date/Time   CALCIUM 9.4 02/27/2015 1336   CALCIUM 9.0 02/13/2015 1130   ALKPHOS 70 02/27/2015 1336   ALKPHOS 53 01/25/2015 0500   AST 10 02/27/2015 1336   AST 12 01/25/2015 0500   ALT 12 02/27/2015 1336   ALT 12 01/25/2015 0500   BILITOT <0.20 02/27/2015 1336   BILITOT 0.5 01/25/2015 0500       Lab Results  Component Value Date   WBC 12.6* 02/27/2015   HGB 10.4* 02/27/2015   HCT 32.9* 02/27/2015   MCV 98.2 02/27/2015   PLT 237 02/27/2015   NEUTROABS 10.1* 02/27/2015    ASSESSMENT & PLAN:  No problem-specific assessment & plan notes found for this encounter.  patient is a very pleasant 59 4:00 in female with stage IIIB (T4b, N0, M0) breast cancer of the upper inner quadrant of the left breast. It is ER and PR negative with a K eyes-67 of 98%, HER-2 negative ratio 1.38. She is currently being treated with chemotherapy consisting of  dose dense AC, status post 3 cycles. Overall she's tolerating this treatment relatively well with the exception of some fatigue that lasts for approximately one week and then gradually resolves. Her labs are been reviewed and are well within treatable range. She will proceed with cycle #4 on 02/28/2015 as scheduled. She will follow-up with Dr. Lindi Adie in one week also as previously scheduled with repeat labs. She'll bring her jury summons and I will write her a letter to be excused from jury duty.  No orders of the defined types were placed in this encounter.   The patient has a good understanding of the overall plan. she agrees with it. She will call with any problems that may develop before her next visit here.   Wynetta Emery, Kasidy Gianino E, PA-C

## 2015-02-27 NOTE — Patient Instructions (Signed)
Continue labs and chemotherapy as scheduled Follow-up with Dr. Lindi Adie is briefly scheduled in one week

## 2015-02-28 ENCOUNTER — Ambulatory Visit (HOSPITAL_BASED_OUTPATIENT_CLINIC_OR_DEPARTMENT_OTHER): Payer: Medicaid Other

## 2015-02-28 DIAGNOSIS — C50212 Malignant neoplasm of upper-inner quadrant of left female breast: Secondary | ICD-10-CM

## 2015-02-28 DIAGNOSIS — Z5111 Encounter for antineoplastic chemotherapy: Secondary | ICD-10-CM

## 2015-02-28 DIAGNOSIS — Z5189 Encounter for other specified aftercare: Secondary | ICD-10-CM

## 2015-02-28 MED ORDER — SODIUM CHLORIDE 0.9 % IV SOLN
Freq: Once | INTRAVENOUS | Status: AC
  Administered 2015-02-28: 15:00:00 via INTRAVENOUS

## 2015-02-28 MED ORDER — PALONOSETRON HCL INJECTION 0.25 MG/5ML
INTRAVENOUS | Status: AC
  Filled 2015-02-28: qty 5

## 2015-02-28 MED ORDER — SODIUM CHLORIDE 0.9 % IV SOLN
600.0000 mg/m2 | Freq: Once | INTRAVENOUS | Status: AC
  Administered 2015-02-28: 1100 mg via INTRAVENOUS
  Filled 2015-02-28: qty 55

## 2015-02-28 MED ORDER — SODIUM CHLORIDE 0.9 % IJ SOLN
10.0000 mL | INTRAMUSCULAR | Status: DC | PRN
  Administered 2015-02-28: 10 mL
  Filled 2015-02-28: qty 10

## 2015-02-28 MED ORDER — PALONOSETRON HCL INJECTION 0.25 MG/5ML
0.2500 mg | Freq: Once | INTRAVENOUS | Status: AC
  Administered 2015-02-28: 0.25 mg via INTRAVENOUS

## 2015-02-28 MED ORDER — PEGFILGRASTIM 6 MG/0.6ML ~~LOC~~ PSKT
6.0000 mg | PREFILLED_SYRINGE | Freq: Once | SUBCUTANEOUS | Status: AC
  Administered 2015-02-28: 6 mg via SUBCUTANEOUS
  Filled 2015-02-28: qty 0.6

## 2015-02-28 MED ORDER — SODIUM CHLORIDE 0.9 % IV SOLN
Freq: Once | INTRAVENOUS | Status: AC
  Administered 2015-02-28: 16:00:00 via INTRAVENOUS
  Filled 2015-02-28: qty 5

## 2015-02-28 MED ORDER — HEPARIN SOD (PORK) LOCK FLUSH 100 UNIT/ML IV SOLN
500.0000 [IU] | Freq: Once | INTRAVENOUS | Status: AC | PRN
  Administered 2015-02-28: 500 [IU]
  Filled 2015-02-28: qty 5

## 2015-02-28 MED ORDER — DOXORUBICIN HCL CHEMO IV INJECTION 2 MG/ML
60.0000 mg/m2 | Freq: Once | INTRAVENOUS | Status: AC
  Administered 2015-02-28: 110 mg via INTRAVENOUS
  Filled 2015-02-28: qty 55

## 2015-02-28 NOTE — Patient Instructions (Signed)
[facility] Discharge Instructions for Patients Receiving Chemotherapy  Today you received the following chemotherapy agents Adriamycin, Cytoxan  To help prevent nausea and vomiting after your treatment, we encourage you to take your nausea medication as directed/prescribed   If you develop nausea and vomiting that is not controlled by your nausea medication, call the clinic.   BELOW ARE SYMPTOMS THAT SHOULD BE REPORTED IMMEDIATELY:  *FEVER GREATER THAN 100.5 F  *CHILLS WITH OR WITHOUT FEVER  NAUSEA AND VOMITING THAT IS NOT CONTROLLED WITH YOUR NAUSEA MEDICATION  *UNUSUAL SHORTNESS OF BREATH  *UNUSUAL BRUISING OR BLEEDING  TENDERNESS IN MOUTH AND THROAT WITH OR WITHOUT PRESENCE OF ULCERS  *URINARY PROBLEMS  *BOWEL PROBLEMS  UNUSUAL RASH Items with * indicate a potential emergency and should be followed up as soon as possible.  Feel free to call the clinic you have any questions or concerns. The clinic phone number is [phone].  Please show the [facility] at check-in to the Emergency Department and triage nurse.

## 2015-03-03 ENCOUNTER — Encounter: Payer: Self-pay | Admitting: Physician Assistant

## 2015-03-07 ENCOUNTER — Telehealth: Payer: Self-pay | Admitting: Hematology and Oncology

## 2015-03-07 ENCOUNTER — Ambulatory Visit (HOSPITAL_BASED_OUTPATIENT_CLINIC_OR_DEPARTMENT_OTHER): Payer: Medicaid Other | Admitting: Hematology and Oncology

## 2015-03-07 ENCOUNTER — Other Ambulatory Visit (HOSPITAL_BASED_OUTPATIENT_CLINIC_OR_DEPARTMENT_OTHER): Payer: Medicaid Other

## 2015-03-07 VITALS — BP 99/51 | HR 106 | Temp 98.2°F | Resp 17 | Ht 63.0 in | Wt 157.5 lb

## 2015-03-07 DIAGNOSIS — R5383 Other fatigue: Secondary | ICD-10-CM

## 2015-03-07 DIAGNOSIS — C50212 Malignant neoplasm of upper-inner quadrant of left female breast: Secondary | ICD-10-CM

## 2015-03-07 DIAGNOSIS — D6959 Other secondary thrombocytopenia: Secondary | ICD-10-CM | POA: Diagnosis not present

## 2015-03-07 DIAGNOSIS — D6481 Anemia due to antineoplastic chemotherapy: Secondary | ICD-10-CM

## 2015-03-07 LAB — CBC WITH DIFFERENTIAL/PLATELET
BASO%: 8 % — ABNORMAL HIGH (ref 0.0–2.0)
Basophils Absolute: 0 10*3/uL (ref 0.0–0.1)
EOS%: 4 % (ref 0.0–7.0)
Eosinophils Absolute: 0 10*3/uL (ref 0.0–0.5)
HCT: 27.9 % — ABNORMAL LOW (ref 34.8–46.6)
HGB: 9.2 g/dL — ABNORMAL LOW (ref 11.6–15.9)
LYMPH%: 68 % — ABNORMAL HIGH (ref 14.0–49.7)
MCH: 31.3 pg (ref 25.1–34.0)
MCHC: 33 g/dL (ref 31.5–36.0)
MCV: 94.9 fL (ref 79.5–101.0)
MONO#: 0 10*3/uL — ABNORMAL LOW (ref 0.1–0.9)
MONO%: 8 % (ref 0.0–14.0)
NEUT#: 0 10*3/uL — CL (ref 1.5–6.5)
NEUT%: 12 % — ABNORMAL LOW (ref 38.4–76.8)
Platelets: 72 10*3/uL — ABNORMAL LOW (ref 145–400)
RBC: 2.94 10*6/uL — ABNORMAL LOW (ref 3.70–5.45)
RDW: 18.6 % — ABNORMAL HIGH (ref 11.2–14.5)
WBC: 0.3 10*3/uL — CL (ref 3.9–10.3)
lymph#: 0.2 10*3/uL — ABNORMAL LOW (ref 0.9–3.3)
nRBC: 0 % (ref 0–0)

## 2015-03-07 LAB — COMPREHENSIVE METABOLIC PANEL (CC13)
ALBUMIN: 3.1 g/dL — AB (ref 3.5–5.0)
ALK PHOS: 65 U/L (ref 40–150)
ALT: 10 U/L (ref 0–55)
AST: 9 U/L (ref 5–34)
Anion Gap: 9 mEq/L (ref 3–11)
BUN: 16.4 mg/dL (ref 7.0–26.0)
CO2: 23 mEq/L (ref 22–29)
Calcium: 9.4 mg/dL (ref 8.4–10.4)
Chloride: 104 mEq/L (ref 98–109)
Creatinine: 0.7 mg/dL (ref 0.6–1.1)
EGFR: >90 mL/min/{1.73_m2} (ref >90)
Glucose: 113 mg/dl (ref 70–140)
POTASSIUM: 4.4 meq/L (ref 3.5–5.1)
SODIUM: 136 meq/L (ref 136–145)
Total Bilirubin: 0.94 mg/dL (ref 0.20–1.20)
Total Protein: 6 g/dL — ABNORMAL LOW (ref 6.4–8.3)

## 2015-03-07 MED ORDER — METOPROLOL TARTRATE 50 MG PO TABS: 50.0000 mg | ORAL_TABLET | Freq: Two times a day (BID) | ORAL | Status: AC

## 2015-03-07 NOTE — Progress Notes (Signed)
Patient Care Team: Antony Blackbird, MD as PCP - General (Family Medicine)  DIAGNOSIS: Breast cancer of upper-inner quadrant of left female breast   Staging form: Breast, AJCC 7th Edition     Clinical: Stage IIIB (T4b, N0, M0) - Signed by Rulon Eisenmenger, MD on 01/17/2015   SUMMARY OF ONCOLOGIC HISTORY:   Breast cancer of upper-inner quadrant of left female breast   12/27/2014 Initial Diagnosis Grade 3 invasive mammary cancer, one lymph node negative in left axilla, ER 0%, PR 0%, Ki-67 98%, HER-2 negative ratio 1.38   01/06/2015 Breast MRI Left breast large enhancing mass, evidence of necrosis, extending from posterior through the middle third of left breast, extending from chest wall to the dermis, close to pectoralis fascia extending 4.5 cm, several bilateral axillary lymph nodes   01/17/2015 -  Neo-Adjuvant Chemotherapy Neoadjuvant chemotherapy with dose dense Adriamycin and Cytoxan followed by Taxol and carboplatin   01/24/2015 - 01/27/2015 Hospital Admission Neutropenic fever admission, no source identified    CHIEF COMPLIANT: Cycle 4 day 8 Adriamycin and Cytoxan  INTERVAL HISTORY: Patty Buckley is a 64 year old lady with a large left breast mass with evidence of necrosis close to the pectoralis fascia was currently on neoadjuvant chemotherapy. After the fourth cycle of Adriamycin Cytoxan she felt extremely fatigued and weak. She doesn't have any energy to do any activities. Denies any nausea or vomiting.  REVIEW OF SYSTEMS:   Constitutional: Denies fevers, chills or abnormal weight loss Eyes: Denies blurriness of vision Ears, nose, mouth, throat, and face: Denies mucositis or sore throat Respiratory: Denies cough, dyspnea or wheezes Cardiovascular: Denies palpitation, chest discomfort or lower extremity swelling Gastrointestinal:  Denies nausea, heartburn or change in bowel habits Skin: Denies abnormal skin rashes Lymphatics: Denies new lymphadenopathy or easy bruising Neurological:Denies  numbness, tingling or new weaknesses Behavioral/Psych: Mood is stable, no new changes  Breast:the large breast lump does not appear to be getting any smaller All other systems were reviewed with the patient and are negative.  I have reviewed the past medical history, past surgical history, social history and family history with the patient and they are unchanged from previous note.  ALLERGIES:  is allergic to codeine.  MEDICATIONS:  Current Outpatient Prescriptions  Medication Sig Dispense Refill  . aspirin 81 MG tablet Take 81 mg by mouth daily.      Marland Kitchen HYDROcodone-acetaminophen (NORCO/VICODIN) 5-325 MG per tablet Take 1-2 tablets by mouth every 4 (four) hours as needed for moderate pain. 30 tablet 0  . lisinopril (PRINIVIL,ZESTRIL) 10 MG tablet Take 1 tablet (10 mg total) by mouth daily. Take 10 mg by mouth every morning. 30 tablet 3  . LORazepam (ATIVAN) 0.5 MG tablet Take 1 tablet (0.5 mg total) by mouth every 6 (six) hours as needed (Nausea or vomiting). 30 tablet 0  . metoprolol (LOPRESSOR) 50 MG tablet Take 1 tablet (50 mg total) by mouth 2 (two) times daily. 180 tablet 3  . Multiple Vitamin (MULTIVITAMIN) tablet Take 1 tablet by mouth daily.      No current facility-administered medications for this visit.    PHYSICAL EXAMINATION: ECOG PERFORMANCE STATUS: 2 - Symptomatic, <50% confined to bed  Filed Vitals:   03/07/15 1406  BP: 99/51  Pulse: 106  Temp: 98.2 F (36.8 C)  Resp: 17   Filed Weights   03/07/15 1406  Weight: 157 lb 8 oz (71.442 kg)    GENERAL:alert, no distress and comfortable SKIN: skin color, texture, turgor are normal, no rashes or significant lesions  EYES: normal, Conjunctiva are pink and non-injected, sclera clear OROPHARYNX:no exudate, no erythema and lips, buccal mucosa, and tongue normal  NECK: supple, thyroid normal size, non-tender, without nodularity LYMPH:  no palpable lymphadenopathy in the cervical, axillary or inguinal LUNGS: clear to  auscultation and percussion with normal breathing effort HEART: regular rate & rhythm and no murmurs and no lower extremity edema ABDOMEN:abdomen soft, non-tender and normal bowel sounds Musculoskeletal:no cyanosis of digits and no clubbing  NEURO: alert & oriented x 3 with fluent speech, no focal motor/sensory deficits BREAST:large breast lump at least 6 x 5 cm in size with skin involvement is not much smaller than before. (exam performed in the presence of a chaperone)  LABORATORY DATA:  I have reviewed the data as listed   Chemistry      Component Value Date/Time   NA 136 03/07/2015 1353   NA 140 02/13/2015 1130   K 4.4 03/07/2015 1353   K 4.3 02/13/2015 1130   CL 107 02/13/2015 1130   CO2 23 03/07/2015 1353   CO2 25 02/13/2015 1130   BUN 16.4 03/07/2015 1353   BUN 8 02/13/2015 1130   CREATININE 0.7 03/07/2015 1353   CREATININE 0.74 02/13/2015 1130      Component Value Date/Time   CALCIUM 9.4 03/07/2015 1353   CALCIUM 9.0 02/13/2015 1130   ALKPHOS 65 03/07/2015 1353   ALKPHOS 53 01/25/2015 0500   AST 9 03/07/2015 1353   AST 12 01/25/2015 0500   ALT 10 03/07/2015 1353   ALT 12 01/25/2015 0500   BILITOT 0.94 03/07/2015 1353   BILITOT 0.5 01/25/2015 0500       Lab Results  Component Value Date   WBC 0.3* 03/07/2015   HGB 9.2* 03/07/2015   HCT 27.9* 03/07/2015   MCV 94.9 03/07/2015   PLT 72 Large & giant platelets* 03/07/2015   NEUTROABS 0.0* 03/07/2015   ASSESSMENT & PLAN:  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: [id number] clinical stage IIIB ER 0%, PR 0%, HER-2 negative ratio 1.38, Ki-67 98%  Treatment plan: Neoadjuvant chemotherapy with dose dense Adriamycin and Cytoxan followed by Taxol and carboplatin followed by mastectomy and radiation Today is cycle 4 date 8 of dose dense Adriamycin and Cytoxan  Treatment response:Clinically the tumor appears to be the same size although it is less red.  Chemotherapy  Toxicities: 1. Neutropenic fever admission after cycle 1. I debated whether or not we need to dose reduce, at this point I have elected to keep the dosage of chemotherapy the same for cycle 2. 2. Severe fatigue the week after chemotherapy 3. Denies any nausea vomiting mucositis or any other concerns 4. Alopecia 5. Chemotherapy-induced anemia 7. Chemotherapy-induced thrombocytopenia  Return to clinic next week to start weekly Taxol and carboplatin.slight reduced dosage due to fatigue and severe neutropenia with Adriamycin and Cytoxan  No orders of the defined types were placed in this encounter.   The patient has a good understanding of the overall plan. she agrees with it. She will call with any problems that may develop before her next visit here.   Rulon Eisenmenger, MD

## 2015-03-07 NOTE — Telephone Encounter (Signed)
Appointments made and avs printed for patient,per dr gudena/diana-verbal he is on call next week and can skip a visit next week and see her 4/14 even though on call then

## 2015-03-16 ENCOUNTER — Other Ambulatory Visit: Payer: Self-pay | Admitting: Hematology and Oncology

## 2015-03-16 ENCOUNTER — Other Ambulatory Visit (HOSPITAL_BASED_OUTPATIENT_CLINIC_OR_DEPARTMENT_OTHER): Payer: Medicaid Other

## 2015-03-16 ENCOUNTER — Other Ambulatory Visit: Payer: Self-pay | Admitting: *Deleted

## 2015-03-16 ENCOUNTER — Ambulatory Visit (HOSPITAL_BASED_OUTPATIENT_CLINIC_OR_DEPARTMENT_OTHER): Payer: Medicaid Other

## 2015-03-16 DIAGNOSIS — D6959 Other secondary thrombocytopenia: Secondary | ICD-10-CM

## 2015-03-16 DIAGNOSIS — D6481 Anemia due to antineoplastic chemotherapy: Secondary | ICD-10-CM | POA: Diagnosis not present

## 2015-03-16 DIAGNOSIS — C50212 Malignant neoplasm of upper-inner quadrant of left female breast: Secondary | ICD-10-CM

## 2015-03-16 DIAGNOSIS — Z5111 Encounter for antineoplastic chemotherapy: Secondary | ICD-10-CM | POA: Diagnosis present

## 2015-03-16 LAB — CBC WITH DIFFERENTIAL/PLATELET
BASO%: 0.2 % (ref 0.0–2.0)
Basophils Absolute: 0 10*3/uL (ref 0.0–0.1)
EOS ABS: 0 10*3/uL (ref 0.0–0.5)
EOS%: 0.2 % (ref 0.0–7.0)
HCT: 31.7 % — ABNORMAL LOW (ref 34.8–46.6)
HEMOGLOBIN: 10.2 g/dL — AB (ref 11.6–15.9)
LYMPH%: 13.5 % — ABNORMAL LOW (ref 14.0–49.7)
MCH: 32.3 pg (ref 25.1–34.0)
MCHC: 32.2 g/dL (ref 31.5–36.0)
MCV: 100.3 fL (ref 79.5–101.0)
MONO#: 1.4 10*3/uL — AB (ref 0.1–0.9)
MONO%: 11 % (ref 0.0–14.0)
NEUT#: 9.7 10*3/uL — ABNORMAL HIGH (ref 1.5–6.5)
NEUT%: 75.1 % (ref 38.4–76.8)
Platelets: 427 10*3/uL — ABNORMAL HIGH (ref 145–400)
RBC: 3.16 10*6/uL — ABNORMAL LOW (ref 3.70–5.45)
RDW: 21.7 % — ABNORMAL HIGH (ref 11.2–14.5)
WBC: 12.9 10*3/uL — ABNORMAL HIGH (ref 3.9–10.3)
lymph#: 1.8 10*3/uL (ref 0.9–3.3)

## 2015-03-16 LAB — COMPREHENSIVE METABOLIC PANEL (CC13)
ALBUMIN: 2.9 g/dL — AB (ref 3.5–5.0)
ALK PHOS: 77 U/L (ref 40–150)
ALT: 12 U/L (ref 0–55)
ANION GAP: 11 meq/L (ref 3–11)
AST: 12 U/L (ref 5–34)
BUN: 8.2 mg/dL (ref 7.0–26.0)
CHLORIDE: 105 meq/L (ref 98–109)
CO2: 26 meq/L (ref 22–29)
Calcium: 8.9 mg/dL (ref 8.4–10.4)
Creatinine: 0.7 mg/dL (ref 0.6–1.1)
EGFR: >90 mL/min/{1.73_m2} (ref >90)
GLUCOSE: 128 mg/dL (ref 70–140)
POTASSIUM: 4.3 meq/L (ref 3.5–5.1)
SODIUM: 142 meq/L (ref 136–145)
TOTAL PROTEIN: 5.9 g/dL — AB (ref 6.4–8.3)
Total Bilirubin: 0.35 mg/dL (ref 0.20–1.20)

## 2015-03-16 MED ORDER — SODIUM CHLORIDE 0.9 % IV SOLN
164.5500 mg | Freq: Once | INTRAVENOUS | Status: AC
  Administered 2015-03-16: 160 mg via INTRAVENOUS
  Filled 2015-03-16: qty 16

## 2015-03-16 MED ORDER — FAMOTIDINE IN NACL 20-0.9 MG/50ML-% IV SOLN
20.0000 mg | Freq: Once | INTRAVENOUS | Status: AC
  Administered 2015-03-16: 20 mg via INTRAVENOUS

## 2015-03-16 MED ORDER — HEPARIN SOD (PORK) LOCK FLUSH 100 UNIT/ML IV SOLN
500.0000 [IU] | Freq: Once | INTRAVENOUS | Status: AC | PRN
  Administered 2015-03-16: 500 [IU]
  Filled 2015-03-16: qty 5

## 2015-03-16 MED ORDER — SODIUM CHLORIDE 0.9 % IJ SOLN
10.0000 mL | INTRAMUSCULAR | Status: DC | PRN
  Administered 2015-03-16: 10 mL
  Filled 2015-03-16: qty 10

## 2015-03-16 MED ORDER — ONDANSETRON HCL 8 MG PO TABS
8.0000 mg | ORAL_TABLET | Freq: Two times a day (BID) | ORAL | Status: DC | PRN
Start: 2015-03-16 — End: 2015-11-16

## 2015-03-16 MED ORDER — DIPHENHYDRAMINE HCL 50 MG/ML IJ SOLN
INTRAMUSCULAR | Status: AC
  Filled 2015-03-16: qty 1

## 2015-03-16 MED ORDER — DIPHENHYDRAMINE HCL 50 MG/ML IJ SOLN
50.0000 mg | Freq: Once | INTRAMUSCULAR | Status: AC
  Administered 2015-03-16: 50 mg via INTRAVENOUS

## 2015-03-16 MED ORDER — SODIUM CHLORIDE 0.9 % IV SOLN
Freq: Once | INTRAVENOUS | Status: AC
  Administered 2015-03-16: 14:00:00 via INTRAVENOUS
  Filled 2015-03-16: qty 8

## 2015-03-16 MED ORDER — FAMOTIDINE IN NACL 20-0.9 MG/50ML-% IV SOLN
INTRAVENOUS | Status: AC
  Filled 2015-03-16: qty 50

## 2015-03-16 MED ORDER — PACLITAXEL CHEMO INJECTION 300 MG/50ML
65.0000 mg/m2 | Freq: Once | INTRAVENOUS | Status: AC
  Administered 2015-03-16: 120 mg via INTRAVENOUS
  Filled 2015-03-16: qty 20

## 2015-03-16 MED ORDER — SODIUM CHLORIDE 0.9 % IV SOLN
Freq: Once | INTRAVENOUS | Status: AC
  Administered 2015-03-16: 13:00:00 via INTRAVENOUS

## 2015-03-16 MED ORDER — PROCHLORPERAZINE MALEATE 10 MG PO TABS: 10.0000 mg | ORAL_TABLET | Freq: Four times a day (QID) | ORAL | Status: AC | PRN

## 2015-03-16 NOTE — Patient Instructions (Signed)
Famotidine injection What is this medicine? FAMOTIDINE (fa MOE ti deen) is a type of antihistamine that blocks the release of stomach acid. It is used to treat stomach or intestinal ulcers. It can relieve ulcer pain and discomfort, and the heartburn from acid reflux. This medicine may be used for other purposes; ask your health care provider or pharmacist if you have questions. COMMON BRAND NAME(S): Pepcid What should I tell my health care provider before I take this medicine? They need to know if you have any of these conditions: -kidney or liver disease -an unusual or allergic reaction to famotidine, other medicines, foods, dyes, or preservatives -pregnant or trying to get pregnant -breast-feeding How should I use this medicine? This medicine is for infusion into a vein. It is given by a health care professional in a hospital or clinic setting. Talk to your pediatrician regarding the use of this medicine in children. Special care may be needed. Overdosage: If you think you have taken too much of this medicine contact a poison control center or emergency room at once. NOTE: This medicine is only for you. Do not share this medicine with others. What if I miss a dose? This does not apply. What may interact with this medicine? -delavirdine -itraconazole -ketoconazole This list may not describe all possible interactions. Give your health care provider a list of all the medicines, herbs, non-prescription drugs, or dietary supplements you use. Also tell them if you smoke, drink alcohol, or use illegal drugs. Some items may interact with your medicine. What should I watch for while using this medicine? Tell your doctor or health care professional if your condition does not start to get better or gets worse. Do not take with aspirin, ibuprofen, or other antiinflammatory medicines. These can aggravate your condition. Do not smoke cigarettes or drink alcohol. These increase irritation in your  stomach and can increase the time it will take for ulcers to heal. Cigarettes and alcohol can also worsen acid reflux or heartburn. If you get black, tarry stools or vomit up what looks like coffee grounds, call your doctor or health care professional at once. You may have a bleeding ulcer. What side effects may I notice from receiving this medicine? Side effects that you should report to your doctor or health care professional as soon as possible: -allergic reactions like skin rash, itching or hives, swelling of the face, lips, or tongue -agitation, nervousness -confusion -hallucinations Side effects that usually do not require medical attention (report to your doctor or health care professional if they continue or are bothersome): -constipation -diarrhea -dizziness -headache This list may not describe all possible side effects. Call your doctor for medical advice about side effects. You may report side effects to FDA at 1-800-FDA-1088. Where should I keep my medicine? This medicine is given in a hospital or clinic. You will not be given this medicine to store at home. NOTE: This sheet is a summary. It may not cover all possible information. If you have questions about this medicine, talk to your doctor, pharmacist, or health care provider.  2015, [employer]. (2008-03-30 13:24:51) Diphenhydramine injection What is this medicine? DIPHENHYDRAMINE (dye fen HYE dra meen) is an antihistamine. It is used to treat the symptoms of an allergic reaction and motion sickness. It is also used to treat Parkinson's disease. This medicine may be used for other purposes; ask your health care provider or pharmacist if you have questions. COMMON BRAND NAME(S): Benadryl What should I tell my health care provider before  I take this medicine? They need to know if you have any of these conditions: -asthma or lung disease -glaucoma -high blood pressure or heart disease -liver disease -pain or  difficulty passing urine -prostate trouble -ulcers or other stomach problems -an unusual or allergic reaction to diphenhydramine, antihistamines, other medicines foods, dyes, or preservatives -pregnant or trying to get pregnant -breast-feeding How should I use this medicine? This medicine is for injection into a vein or a muscle. It is usually given by a health care professional in a hospital or clinic setting. If you get this medicine at home, you will be taught how to prepare and give this medicine. Use exactly as directed. Take your medicine at regular intervals. Do not take your medicine more often than directed. It is important that you put your used needles and syringes in a special sharps container. Do not put them in a trash can. If you do not have a sharps container, call your pharmacist or healthcare provider to get one. Talk to your pediatrician regarding the use of this medicine in children. While this drug may be prescribed for selected conditions, precautions do apply. This medicine is not approved for use in newborns and premature babies. Patients over 5 years old may have a stronger reaction and need a smaller dose. Overdosage: If you think you have taken too much of this medicine contact a poison control center or emergency room at once. NOTE: This medicine is only for you. Do not share this medicine with others. What if I miss a dose? If you miss a dose, take it as soon as you can. If it is almost time for your next dose, take only that dose. Do not take double or extra doses. What may interact with this medicine? Do not take this medicine with any of the following medications: -MAOIs like Carbex, Eldepryl, Marplan, Nardil, and Parnate This medicine may also interact with the following medications: -alcohol -barbiturates, like phenobarbital -medicines for bladder spasm like oxybutynin, tolterodine -medicines for blood pressure -medicines for depression, anxiety, or  psychotic disturbances -medicines for movement abnormalities or Parkinson's disease -medicines for sleep -other medicines for cold, cough or allergy -some medicines for the stomach like chlordiazepoxide, dicyclomine This list may not describe all possible interactions. Give your health care provider a list of all the medicines, herbs, non-prescription drugs, or dietary supplements you use. Also tell them if you smoke, drink alcohol, or use illegal drugs. Some items may interact with your medicine. What should I watch for while using this medicine? Your condition will be monitored carefully while you are receiving this medicine. Tell your doctor or healthcare professional if your symptoms do not start to get better or if they get worse. You may get drowsy or dizzy. Do not drive, use machinery, or do anything that needs mental alertness until you know how this medicine affects you. Do not stand or sit up quickly, especially if you are an older patient. This reduces the risk of dizzy or fainting spells. Alcohol may interfere with the effect of this medicine. Avoid alcoholic drinks. Your mouth may get dry. Chewing sugarless gum or sucking hard candy, and drinking plenty of water may help. Contact your doctor if the problem does not go away or is severe. What side effects may I notice from receiving this medicine? Side effects that you should report to your doctor or health care professional as soon as possible: -allergic reactions like skin rash, itching or hives, swelling of the face, lips, or  tongue -breathing problems -changes in vision -chills -confused, agitated, nervous -irregular or fast heartbeat -low blood pressure -seizures -tremor -trouble passing urine -unusual bleeding or bruising -unusually weak or tired Side effects that usually do not require medical attention (report to your doctor or health care professional if they continue or are bothersome): -constipation,  diarrhea -drowsy -headache -loss of appetite -stomach upset, vomiting -sweating -thick mucous This list may not describe all possible side effects. Call your doctor for medical advice about side effects. You may report side effects to FDA at 1-800-FDA-1088. Where should I keep my medicine? Keep out of the reach of children. If you are using this medicine at home, you will be instructed on how to store this medicine. Throw away any unused medicine after the expiration date on the label. NOTE: This sheet is a summary. It may not cover all possible information. If you have questions about this medicine, talk to your doctor, pharmacist, or health care provider.  2015, [employer]. (2008-03-15 14:28:35) Dexamethasone injection What is this medicine? DEXAMETHASONE (dex a METH a sone) is a corticosteroid. It is used to treat inflammation of the skin, joints, lungs, and other organs. Common conditions treated include asthma, allergies, and arthritis. It is also used for other conditions, like blood disorders and diseases of the adrenal glands. This medicine may be used for other purposes; ask your health care provider or pharmacist if you have questions. COMMON BRAND NAME(S): Decadron, Solurex What should I tell my health care provider before I take this medicine? They need to know if you have any of these conditions: -blood clotting problems -Cushing's syndrome -diabetes -glaucoma -heart problems or disease -high blood pressure -infection like herpes, measles, tuberculosis, or chickenpox -kidney disease -liver disease -mental problems -myasthenia gravis -osteoporosis -previous heart attack -seizures -stomach, ulcer or intestine disease including colitis and diverticulitis -thyroid problem -an unusual or allergic reaction to dexamethasone, corticosteroids, other medicines, lactose, foods, dyes, or preservatives -pregnant or trying to get pregnant -breast-feeding How should  I use this medicine? This medicine is for injection into a muscle, joint, lesion, soft tissue, or vein. It is given by a health care professional in a hospital or clinic setting. Talk to your pediatrician regarding the use of this medicine in children. Special care may be needed. Overdosage: If you think you have taken too much of this medicine contact a poison control center or emergency room at once. NOTE: This medicine is only for you. Do not share this medicine with others. What if I miss a dose? This may not apply. If you are having a series of injections over a prolonged period, try not to miss an appointment. Call your doctor or health care professional to reschedule if you are unable to keep an appointment. What may interact with this medicine? Do not take this medicine with any of the following medications: -mifepristone, RU-486 -vaccines This medicine may also interact with the following medications: -amphotericin B -antibiotics like clarithromycin, erythromycin, and troleandomycin -aspirin and aspirin-like drugs -barbiturates like phenobarbital -carbamazepine -cholestyramine -cholinesterase inhibitors like donepezil, galantamine, rivastigmine, and tacrine -cyclosporine -digoxin -diuretics -ephedrine -female hormones, like estrogens or progestins and birth control pills -indinavir -isoniazid -ketoconazole -medicines for diabetes -medicines that improve muscle tone or strength for conditions like myasthenia gravis -NSAIDs, medicines for pain and inflammation, like ibuprofen or naproxen -phenytoin -rifampin -thalidomide -warfarin This list may not describe all possible interactions. Give your health care provider a list of all the medicines, herbs, non-prescription drugs, or dietary supplements you use.  Also tell them if you smoke, drink alcohol, or use illegal drugs. Some items may interact with your medicine. What should I watch for while using this medicine? Your  condition will be monitored carefully while you are receiving this medicine. If you are taking this medicine for a long time, carry an identification card with your name and address, the type and dose of your medicine, and your doctor's name and address. This medicine may increase your risk of getting an infection. Stay away from people who are sick. Tell your doctor or health care professional if you are around anyone with measles or chickenpox. Talk to your health care provider before you get any vaccines that you take this medicine. If you are going to have surgery, tell your doctor or health care professional that you have taken this medicine within the last twelve months. Ask your doctor or health care professional about your diet. You may need to lower the amount of salt you eat. The medicine can increase your blood sugar. If you are a diabetic check with your doctor if you need help adjusting the dose of your diabetic medicine. What side effects may I notice from receiving this medicine? Side effects that you should report to your doctor or health care professional as soon as possible: -allergic reactions like skin rash, itching or hives, swelling of the face, lips, or tongue -black or tarry stools -change in the amount of urine -changes in vision -confusion, excitement, restlessness, a false sense of well-being -fever, sore throat, sneezing, cough, or other signs of infection, wounds that will not heal -hallucinations -increased thirst -mental depression, mood swings, mistaken feelings of self importance or of being mistreated -pain in hips, back, ribs, arms, shoulders, or legs -pain, redness, or irritation at the injection site -redness, blistering, peeling or loosening of the skin, including inside the mouth -rounding out of face -swelling of feet or lower legs -unusual bleeding or bruising -unusual tired or weak -wounds that do not heal Side effects that usually do not require  medical attention (report to your doctor or health care professional if they continue or are bothersome): -diarrhea or constipation -change in taste -headache -nausea, vomiting -skin problems, acne, thin and shiny skin -touble sleeping -unusual growth of hair on the face or body -weight gain This list may not describe all possible side effects. Call your doctor for medical advice about side effects. You may report side effects to FDA at 1-800-FDA-1088. Where should I keep my medicine? This drug is given in a hospital or clinic and will not be stored at home. NOTE: This sheet is a summary. It may not cover all possible information. If you have questions about this medicine, talk to your doctor, pharmacist, or health care provider.  2015, [employer]. (2008-03-17 14:04:12) Ondansetron injection What is this medicine? ONDANSETRON (on DAN se tron) is used to treat nausea and vomiting caused by chemotherapy. It is also used to prevent or treat nausea and vomiting after surgery. This medicine may be used for other purposes; ask your health care provider or pharmacist if you have questions. COMMON BRAND NAME(S): Zofran What should I tell my health care provider before I take this medicine? They need to know if you have any of these conditions: -heart disease -history of irregular heartbeat -liver disease -low levels of magnesium or potassium in the blood -an unusual or allergic reaction to ondansetron, granisetron, other medicines, foods, dyes, or preservatives -pregnant or trying to get pregnant -breast-feeding How should I  use this medicine? This medicine is for infusion into a vein. It is given by a health care professional in a hospital or clinic setting. Talk to your pediatrician regarding the use of this medicine in children. Special care may be needed. Overdosage: If you think you have taken too much of this medicine contact a poison control center or emergency room at  once. NOTE: This medicine is only for you. Do not share this medicine with others. What if I miss a dose? This does not apply. What may interact with this medicine? Do not take this medicine with any of the following medications: -apomorphine -certain medicines for fungal infections like fluconazole, itraconazole, ketoconazole, posaconazole, voriconazole -cisapride -dofetilide -dronedarone -pimozide -thioridazine -ziprasidone This medicine may also interact with the following medications: -carbamazepine -certain medicines for depression, anxiety, or psychotic disturbances -fentanyl -linezolid -MAOIs like Carbex, Eldepryl, Marplan, Nardil, and Parnate -methylene blue (injected into a vein) -other medicines that prolong the QT interval (cause an abnormal heart rhythm) -phenytoin -rifampicin -tramadol This list may not describe all possible interactions. Give your health care provider a list of all the medicines, herbs, non-prescription drugs, or dietary supplements you use. Also tell them if you smoke, drink alcohol, or use illegal drugs. Some items may interact with your medicine. What should I watch for while using this medicine? Your condition will be monitored carefully while you are receiving this medicine. What side effects may I notice from receiving this medicine? Side effects that you should report to your doctor or health care professional as soon as possible: -allergic reactions like skin rash, itching or hives, swelling of the face, lips, or tongue -breathing problems -confusion -dizziness -fast or irregular heartbeat -feeling faint or lightheaded, falls -fever and chills -loss of balance or coordination -seizures -sweating -swelling of the hands and feet -tightness in the chest -tremors -unusually weak or tired Side effects that usually do not require medical attention (report to your doctor or health care professional if they continue or are  bothersome): -constipation or diarrhea -headache This list may not describe all possible side effects. Call your doctor for medical advice about side effects. You may report side effects to FDA at 1-800-FDA-1088. Where should I keep my medicine? This drug is given in a hospital or clinic and will not be stored at home. NOTE: This sheet is a summary. It may not cover all possible information. If you have questions about this medicine, talk to your doctor, pharmacist, or health care provider.  2015, [employer]. (2013-09-01 16:18:28) Carboplatin injection What is this medicine? CARBOPLATIN (KAR boe pla tin) is a chemotherapy drug. It targets fast dividing cells, like cancer cells, and causes these cells to die. This medicine is used to treat ovarian cancer and many other cancers. This medicine may be used for other purposes; ask your health care provider or pharmacist if you have questions. COMMON BRAND NAME(S): Paraplatin What should I tell my health care provider before I take this medicine? They need to know if you have any of these conditions: -blood disorders -hearing problems -kidney disease -recent or ongoing radiation therapy -an unusual or allergic reaction to carboplatin, cisplatin, other chemotherapy, other medicines, foods, dyes, or preservatives -pregnant or trying to get pregnant -breast-feeding How should I use this medicine? This drug is usually given as an infusion into a vein. It is administered in a hospital or clinic by a specially trained health care professional. Talk to your pediatrician regarding the use of this medicine in children. Special care  may be needed. Overdosage: If you think you have taken too much of this medicine contact a poison control center or emergency room at once. NOTE: This medicine is only for you. Do not share this medicine with others. What if I miss a dose? It is important not to miss a dose. Call your doctor or health care  professional if you are unable to keep an appointment. What may interact with this medicine? -medicines for seizures -medicines to increase blood counts like filgrastim, pegfilgrastim, sargramostim -some antibiotics like amikacin, gentamicin, neomycin, streptomycin, tobramycin -vaccines Talk to your doctor or health care professional before taking any of these medicines: -acetaminophen -aspirin -ibuprofen -ketoprofen -naproxen This list may not describe all possible interactions. Give your health care provider a list of all the medicines, herbs, non-prescription drugs, or dietary supplements you use. Also tell them if you smoke, drink alcohol, or use illegal drugs. Some items may interact with your medicine. What should I watch for while using this medicine? Your condition will be monitored carefully while you are receiving this medicine. You will need important blood work done while you are taking this medicine. This drug may make you feel generally unwell. This is not uncommon, as chemotherapy can affect healthy cells as well as cancer cells. Report any side effects. Continue your course of treatment even though you feel ill unless your doctor tells you to stop. In some cases, you may be given additional medicines to help with side effects. Follow all directions for their use. Call your doctor or health care professional for advice if you get a fever, chills or sore throat, or other symptoms of a cold or flu. Do not treat yourself. This drug decreases your body's ability to fight infections. Try to avoid being around people who are sick. This medicine may increase your risk to bruise or bleed. Call your doctor or health care professional if you notice any unusual bleeding. Be careful brushing and flossing your teeth or using a toothpick because you may get an infection or bleed more easily. If you have any dental work done, tell your dentist you are receiving this medicine. Avoid taking  products that contain aspirin, acetaminophen, ibuprofen, naproxen, or ketoprofen unless instructed by your doctor. These medicines may hide a fever. Do not become pregnant while taking this medicine. Women should inform their doctor if they wish to become pregnant or think they might be pregnant. There is a potential for serious side effects to an unborn child. Talk to your health care professional or pharmacist for more information. Do not breast-feed an infant while taking this medicine. What side effects may I notice from receiving this medicine? Side effects that you should report to your doctor or health care professional as soon as possible: -allergic reactions like skin rash, itching or hives, swelling of the face, lips, or tongue -signs of infection - fever or chills, cough, sore throat, pain or difficulty passing urine -signs of decreased platelets or bleeding - bruising, pinpoint red spots on the skin, black, tarry stools, nosebleeds -signs of decreased red blood cells - unusually weak or tired, fainting spells, lightheadedness -breathing problems -changes in hearing -changes in vision -chest pain -high blood pressure -low blood counts - This drug may decrease the number of white blood cells, red blood cells and platelets. You may be at increased risk for infections and bleeding. -nausea and vomiting -pain, swelling, redness or irritation at the injection site -pain, tingling, numbness in the hands or feet -problems with balance,  talking, walking -trouble passing urine or change in the amount of urine Side effects that usually do not require medical attention (report to your doctor or health care professional if they continue or are bothersome): -hair loss -loss of appetite -metallic taste in the mouth or changes in taste This list may not describe all possible side effects. Call your doctor for medical advice about side effects. You may report side effects to FDA at  1-800-FDA-1088. Where should I keep my medicine? This drug is given in a hospital or clinic and will not be stored at home. NOTE: This sheet is a summary. It may not cover all possible information. If you have questions about this medicine, talk to your doctor, pharmacist, or health care provider.  2015, [employer]. (2008-03-01 14:38:05) Paclitaxel injection What is this medicine? PACLITAXEL (PAK li TAX el) is a chemotherapy drug. It targets fast dividing cells, like cancer cells, and causes these cells to die. This medicine is used to treat ovarian cancer, breast cancer, and other cancers. This medicine may be used for other purposes; ask your health care provider or pharmacist if you have questions. COMMON BRAND NAME(S): Onxol, Taxol What should I tell my health care provider before I take this medicine? They need to know if you have any of these conditions: -blood disorders -irregular heartbeat -infection (especially a virus infection such as chickenpox, cold sores, or herpes) -liver disease -previous or ongoing radiation therapy -an unusual or allergic reaction to paclitaxel, alcohol, polyoxyethylated castor oil, other chemotherapy agents, other medicines, foods, dyes, or preservatives -pregnant or trying to get pregnant -breast-feeding How should I use this medicine? This drug is given as an infusion into a vein. It is administered in a hospital or clinic by a specially trained health care professional. Talk to your pediatrician regarding the use of this medicine in children. Special care may be needed. Overdosage: If you think you have taken too much of this medicine contact a poison control center or emergency room at once. NOTE: This medicine is only for you. Do not share this medicine with others. What if I miss a dose? It is important not to miss your dose. Call your doctor or health care professional if you are unable to keep an appointment. What may interact with  this medicine? Do not take this medicine with any of the following medications: -disulfiram -metronidazole This medicine may also interact with the following medications: -cyclosporine -diazepam -ketoconazole -medicines to increase blood counts like filgrastim, pegfilgrastim, sargramostim -other chemotherapy drugs like cisplatin, doxorubicin, epirubicin, etoposide, teniposide, vincristine -quinidine -testosterone -vaccines -verapamil Talk to your doctor or health care professional before taking any of these medicines: -acetaminophen -aspirin -ibuprofen -ketoprofen -naproxen This list may not describe all possible interactions. Give your health care provider a list of all the medicines, herbs, non-prescription drugs, or dietary supplements you use. Also tell them if you smoke, drink alcohol, or use illegal drugs. Some items may interact with your medicine. What should I watch for while using this medicine? Your condition will be monitored carefully while you are receiving this medicine. You will need important blood work done while you are taking this medicine. This drug may make you feel generally unwell. This is not uncommon, as chemotherapy can affect healthy cells as well as cancer cells. Report any side effects. Continue your course of treatment even though you feel ill unless your doctor tells you to stop. In some cases, you may be given additional medicines to help with side effects. Follow  all directions for their use. Call your doctor or health care professional for advice if you get a fever, chills or sore throat, or other symptoms of a cold or flu. Do not treat yourself. This drug decreases your body's ability to fight infections. Try to avoid being around people who are sick. This medicine may increase your risk to bruise or bleed. Call your doctor or health care professional if you notice any unusual bleeding. Be careful brushing and flossing your teeth or using a toothpick  because you may get an infection or bleed more easily. If you have any dental work done, tell your dentist you are receiving this medicine. Avoid taking products that contain aspirin, acetaminophen, ibuprofen, naproxen, or ketoprofen unless instructed by your doctor. These medicines may hide a fever. Do not become pregnant while taking this medicine. Women should inform their doctor if they wish to become pregnant or think they might be pregnant. There is a potential for serious side effects to an unborn child. Talk to your health care professional or pharmacist for more information. Do not breast-feed an infant while taking this medicine. Men are advised not to father a child while receiving this medicine. What side effects may I notice from receiving this medicine? Side effects that you should report to your doctor or health care professional as soon as possible: -allergic reactions like skin rash, itching or hives, swelling of the face, lips, or tongue -low blood counts - This drug may decrease the number of white blood cells, red blood cells and platelets. You may be at increased risk for infections and bleeding. -signs of infection - fever or chills, cough, sore throat, pain or difficulty passing urine -signs of decreased platelets or bleeding - bruising, pinpoint red spots on the skin, black, tarry stools, nosebleeds -signs of decreased red blood cells - unusually weak or tired, fainting spells, lightheadedness -breathing problems -chest pain -high or low blood pressure -mouth sores -nausea and vomiting -pain, swelling, redness or irritation at the injection site -pain, tingling, numbness in the hands or feet -slow or irregular heartbeat -swelling of the ankle, feet, hands Side effects that usually do not require medical attention (report to your doctor or health care professional if they continue or are bothersome): -bone pain -complete hair loss including hair on your head, underarms,  pubic hair, eyebrows, and eyelashes -changes in the color of fingernails -diarrhea -loosening of the fingernails -loss of appetite -muscle or joint pain -red flush to skin -sweating This list may not describe all possible side effects. Call your doctor for medical advice about side effects. You may report side effects to FDA at 1-800-FDA-1088. Where should I keep my medicine? This drug is given in a hospital or clinic and will not be stored at home. NOTE: This sheet is a summary. It may not cover all possible information. If you have questions about this medicine, talk to your doctor, pharmacist, or health care provider.  2015, [employer]. (2013-01-18 16:41:21)

## 2015-03-17 ENCOUNTER — Telehealth: Payer: Self-pay

## 2015-03-17 NOTE — Telephone Encounter (Signed)
Called sister back, answered her questions, discussed coming with pt for next round of chemo to talk personally with Dr Lindi Adie and to make sure pt has a driver d/t drowsiness from benadryl.

## 2015-03-17 NOTE — Telephone Encounter (Signed)
Sister calling asking goal of 1 chemo, how is effectiveness measured, and goal of current chemo.

## 2015-03-23 ENCOUNTER — Ambulatory Visit (HOSPITAL_BASED_OUTPATIENT_CLINIC_OR_DEPARTMENT_OTHER): Payer: Medicaid Other

## 2015-03-23 ENCOUNTER — Other Ambulatory Visit: Payer: Self-pay | Admitting: *Deleted

## 2015-03-23 ENCOUNTER — Other Ambulatory Visit (HOSPITAL_BASED_OUTPATIENT_CLINIC_OR_DEPARTMENT_OTHER): Payer: Medicaid Other

## 2015-03-23 ENCOUNTER — Telehealth: Payer: Self-pay | Admitting: Hematology and Oncology

## 2015-03-23 ENCOUNTER — Ambulatory Visit (HOSPITAL_BASED_OUTPATIENT_CLINIC_OR_DEPARTMENT_OTHER): Payer: Medicaid Other | Admitting: Hematology and Oncology

## 2015-03-23 VITALS — BP 118/60 | HR 97 | Temp 98.1°F | Resp 18 | Ht 63.0 in | Wt 158.4 lb

## 2015-03-23 DIAGNOSIS — D701 Agranulocytosis secondary to cancer chemotherapy: Secondary | ICD-10-CM | POA: Diagnosis not present

## 2015-03-23 DIAGNOSIS — C50212 Malignant neoplasm of upper-inner quadrant of left female breast: Secondary | ICD-10-CM

## 2015-03-23 DIAGNOSIS — Z5111 Encounter for antineoplastic chemotherapy: Secondary | ICD-10-CM

## 2015-03-23 DIAGNOSIS — C773 Secondary and unspecified malignant neoplasm of axilla and upper limb lymph nodes: Secondary | ICD-10-CM

## 2015-03-23 LAB — COMPREHENSIVE METABOLIC PANEL (CC13)
ALK PHOS: 67 U/L (ref 40–150)
ALT: 11 U/L (ref 0–55)
AST: 15 U/L (ref 5–34)
Albumin: 2.8 g/dL — ABNORMAL LOW (ref 3.5–5.0)
Anion Gap: 10 mEq/L (ref 3–11)
BUN: 6 mg/dL — ABNORMAL LOW (ref 7.0–26.0)
CALCIUM: 8.8 mg/dL (ref 8.4–10.4)
CO2: 24 mEq/L (ref 22–29)
Chloride: 106 mEq/L (ref 98–109)
Creatinine: 0.7 mg/dL (ref 0.6–1.1)
EGFR: >90 mL/min/{1.73_m2} (ref >90)
Glucose: 152 mg/dl — ABNORMAL HIGH (ref 70–140)
Potassium: 4.1 mEq/L (ref 3.5–5.1)
SODIUM: 141 meq/L (ref 136–145)
TOTAL PROTEIN: 5.9 g/dL — AB (ref 6.4–8.3)
Total Bilirubin: 0.42 mg/dL (ref 0.20–1.20)

## 2015-03-23 LAB — CBC WITH DIFFERENTIAL/PLATELET
BASO%: 0.2 % (ref 0.0–2.0)
Basophils Absolute: 0 10*3/uL (ref 0.0–0.1)
EOS%: 0.5 % (ref 0.0–7.0)
Eosinophils Absolute: 0 10*3/uL (ref 0.0–0.5)
HCT: 29.9 % — ABNORMAL LOW (ref 34.8–46.6)
HGB: 9.7 g/dL — ABNORMAL LOW (ref 11.6–15.9)
LYMPH#: 1 10*3/uL (ref 0.9–3.3)
LYMPH%: 13.7 % — ABNORMAL LOW (ref 14.0–49.7)
MCH: 32.2 pg (ref 25.1–34.0)
MCHC: 32.4 g/dL (ref 31.5–36.0)
MCV: 99.4 fL (ref 79.5–101.0)
MONO#: 0.7 10*3/uL (ref 0.1–0.9)
MONO%: 9.2 % (ref 0.0–14.0)
NEUT%: 76.4 % (ref 38.4–76.8)
NEUTROS ABS: 5.9 10*3/uL (ref 1.5–6.5)
Platelets: 403 10*3/uL — ABNORMAL HIGH (ref 145–400)
RBC: 3.01 10*6/uL — AB (ref 3.70–5.45)
RDW: 21.9 % — ABNORMAL HIGH (ref 11.2–14.5)
WBC: 7.7 10*3/uL (ref 3.9–10.3)

## 2015-03-23 MED ORDER — DIPHENHYDRAMINE HCL 50 MG/ML IJ SOLN
INTRAMUSCULAR | Status: AC
  Filled 2015-03-23: qty 1

## 2015-03-23 MED ORDER — FAMOTIDINE IN NACL 20-0.9 MG/50ML-% IV SOLN
INTRAVENOUS | Status: AC
  Filled 2015-03-23: qty 50

## 2015-03-23 MED ORDER — FAMOTIDINE IN NACL 20-0.9 MG/50ML-% IV SOLN
20.0000 mg | Freq: Once | INTRAVENOUS | Status: AC
  Administered 2015-03-23: 20 mg via INTRAVENOUS

## 2015-03-23 MED ORDER — DIPHENHYDRAMINE HCL 50 MG/ML IJ SOLN
50.0000 mg | Freq: Once | INTRAMUSCULAR | Status: AC
  Administered 2015-03-23: 50 mg via INTRAVENOUS

## 2015-03-23 MED ORDER — PACLITAXEL CHEMO INJECTION 300 MG/50ML
65.0000 mg/m2 | Freq: Once | INTRAVENOUS | Status: AC
  Administered 2015-03-23: 120 mg via INTRAVENOUS
  Filled 2015-03-23: qty 20

## 2015-03-23 MED ORDER — HEPARIN SOD (PORK) LOCK FLUSH 100 UNIT/ML IV SOLN
500.0000 [IU] | Freq: Once | INTRAVENOUS | Status: AC | PRN
  Administered 2015-03-23: 500 [IU]
  Filled 2015-03-23: qty 5

## 2015-03-23 MED ORDER — SODIUM CHLORIDE 0.9 % IJ SOLN
10.0000 mL | INTRAMUSCULAR | Status: DC | PRN
  Administered 2015-03-23: 10 mL
  Filled 2015-03-23: qty 10

## 2015-03-23 MED ORDER — SODIUM CHLORIDE 0.9 % IV SOLN
Freq: Once | INTRAVENOUS | Status: AC
  Administered 2015-03-23: 14:00:00 via INTRAVENOUS

## 2015-03-23 MED ORDER — SODIUM CHLORIDE 0.9 % IV SOLN
Freq: Once | INTRAVENOUS | Status: AC
  Administered 2015-03-23: 14:00:00 via INTRAVENOUS
  Filled 2015-03-23: qty 8

## 2015-03-23 MED ORDER — HYDROCODONE-ACETAMINOPHEN 5-325 MG PO TABS: 1.0000 | ORAL_TABLET | ORAL | Status: DC | PRN

## 2015-03-23 MED ORDER — SODIUM CHLORIDE 0.9 % IV SOLN
164.5500 mg | Freq: Once | INTRAVENOUS | Status: AC
  Administered 2015-03-23: 160 mg via INTRAVENOUS
  Filled 2015-03-23: qty 16

## 2015-03-23 NOTE — Telephone Encounter (Signed)
Appointment made and avs will be printed for patient in chemo

## 2015-03-23 NOTE — Progress Notes (Deleted)
Patient is getting Taxol and carboplatin correction to be above assessment and plan

## 2015-03-23 NOTE — Progress Notes (Addendum)
Patient Care Team: Antony Blackbird, MD as PCP - General (Family Medicine)  DIAGNOSIS: Breast cancer of upper-inner quadrant of left female breast   Staging form: Breast, AJCC 7th Edition     Clinical: Stage IIIB (T4b, N0, M0) - Signed by Rulon Eisenmenger, MD on 01/17/2015   SUMMARY OF ONCOLOGIC HISTORY:   Breast cancer of upper-inner quadrant of left female breast   12/27/2014 Initial Diagnosis Grade 3 invasive mammary cancer, one lymph node negative in left axilla, ER 0%, PR 0%, Ki-67 98%, HER-2 negative ratio 1.38   01/06/2015 Breast MRI Left breast large enhancing mass, evidence of necrosis, extending from posterior through the middle third of left breast, extending from chest wall to the dermis, close to pectoralis fascia extending 4.5 cm, several bilateral axillary lymph nodes   01/17/2015 -  Neo-Adjuvant Chemotherapy Neoadjuvant chemotherapy with dose dense Adriamycin and Cytoxan followed by Taxol and carboplatin   01/24/2015 - 01/27/2015 Hospital Admission Neutropenic fever admission, no source identified    CHIEF COMPLIANT: Concern for worsening breast cancer today is 2/12 Taxol  INTERVAL HISTORY: Patty Buckley is a 64 year old lady with above-mentioned history of left-sided breast cancer with a very large mass with necrosis currently on neoadjuvant chemotherapy. Completed 4 cycles of Adriamycin and Cytoxan and is currently on Taxol. She is complaining that the tumor appears to be slightly bigger and losing and this morning even had some blood come out of it. She is worried that the cancer is getting worse. Denies any nausea or vomiting. She does have mild fatigue and lack of appetite and lack of taste.  REVIEW OF SYSTEMS:   Constitutional: Denies fevers, chills or abnormal weight loss Eyes: Denies blurriness of vision Ears, nose, mouth, throat, and face: Denies mucositis or sore throat Respiratory: Denies cough, dyspnea or wheezes Cardiovascular: Denies palpitation, chest discomfort or lower  extremity swelling Gastrointestinal:  Denies nausea, heartburn or change in bowel habits Skin: Denies abnormal skin rashes Lymphatics: Denies new lymphadenopathy or easy bruising Neurological:Denies numbness, tingling or new weaknesses Behavioral/Psych: Mood is stable, no new changes  Breast: The tumor in the breast appears to be getting bigger  All other systems were reviewed with the patient and are negative.  I have reviewed the past medical history, past surgical history, social history and family history with the patient and they are unchanged from previous note.  ALLERGIES:  is allergic to codeine.  MEDICATIONS:  Current Outpatient Prescriptions  Medication Sig Dispense Refill  . aspirin 81 MG tablet Take 81 mg by mouth daily.      Marland Kitchen HYDROcodone-acetaminophen (NORCO/VICODIN) 5-325 MG per tablet Take 1-2 tablets by mouth every 4 (four) hours as needed for moderate pain. 30 tablet 0  . lisinopril (PRINIVIL,ZESTRIL) 10 MG tablet Take 1 tablet (10 mg total) by mouth daily. Take 10 mg by mouth every morning. 30 tablet 3  . LORazepam (ATIVAN) 0.5 MG tablet Take 1 tablet (0.5 mg total) by mouth every 6 (six) hours as needed (Nausea or vomiting). 30 tablet 0  . metoprolol (LOPRESSOR) 50 MG tablet Take 1 tablet (50 mg total) by mouth 2 (two) times daily. 180 tablet 3  . Multiple Vitamin (MULTIVITAMIN) tablet Take 1 tablet by mouth daily.     . ondansetron (ZOFRAN) 8 MG tablet Take 1 tablet (8 mg total) by mouth 2 (two) times daily as needed. Start on the third day after chemotherapy. 30 tablet 1  . prochlorperazine (COMPAZINE) 10 MG tablet Take 1 tablet (10 mg total) by mouth  every 6 (six) hours as needed (Nausea or vomiting). 30 tablet 1   No current facility-administered medications for this visit.    PHYSICAL EXAMINATION: ECOG PERFORMANCE STATUS: 1 - Symptomatic but completely ambulatory  Filed Vitals:   03/23/15 1229  BP: 118/60  Pulse: 97  Temp: 98.1 F (36.7 C)  Resp: 18    Filed Weights   03/23/15 1229  Weight: 158 lb 6 oz (71.838 kg)    GENERAL:alert, no distress and comfortable SKIN: skin color, texture, turgor are normal, no rashes or significant lesions EYES: normal, Conjunctiva are pink and non-injected, sclera clear OROPHARYNX:no exudate, no erythema and lips, buccal mucosa, and tongue normal  NECK: supple, thyroid normal size, non-tender, without nodularity LYMPH:  no palpable lymphadenopathy in the cervical, axillary or inguinal LUNGS: clear to auscultation and percussion with normal breathing effort HEART: regular rate & rhythm and no murmurs and no lower extremity edema ABDOMEN:abdomen soft, non-tender and normal bowel sounds Musculoskeletal:no cyanosis of digits and no clubbing  NEURO: alert & oriented x 3 with fluent speech, no focal motor/sensory deficits BREAST: The left breast mass appears to be the same as what I had noticed before. It appears to be an area of slight purulent discharge. I did not see any evidence of bleeding. Overall impression is stable findings.. (exam performed in the presence of a chaperone)  LABORATORY DATA:  I have reviewed the data as listed   Chemistry      Component Value Date/Time   NA 141 03/23/2015 1211   NA 140 02/13/2015 1130   K 4.1 03/23/2015 1211   K 4.3 02/13/2015 1130   CL 107 02/13/2015 1130   CO2 24 03/23/2015 1211   CO2 25 02/13/2015 1130   BUN 6.0* 03/23/2015 1211   BUN 8 02/13/2015 1130   CREATININE 0.7 03/23/2015 1211   CREATININE 0.74 02/13/2015 1130      Component Value Date/Time   CALCIUM 8.8 03/23/2015 1211   CALCIUM 9.0 02/13/2015 1130   ALKPHOS 67 03/23/2015 1211   ALKPHOS 53 01/25/2015 0500   AST 15 03/23/2015 1211   AST 12 01/25/2015 0500   ALT 11 03/23/2015 1211   ALT 12 01/25/2015 0500   BILITOT 0.42 03/23/2015 1211   BILITOT 0.5 01/25/2015 0500       Lab Results  Component Value Date   WBC 7.7 03/23/2015   HGB 9.7* 03/23/2015   HCT 29.9* 03/23/2015   MCV 99.4  03/23/2015   PLT 403* 03/23/2015   NEUTROABS 5.9 03/23/2015   ASSESSMENT & PLAN:  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 Neutropenic fever admission after cycle 1. I debated whether or not we need to dose reduce, at this point I have elected to keep the dosage of chemotherapy the same for cycle 2. 2. Severe fatigue the week after chemotherapy 3. Denies any nausea vomiting mucositis or any other concerns  Bleeding/discharge from the breast lump: Patient believes that the tumor is getting larger. Because of this I would like to obtain an MRI of the breast to assess at midpoint. If the tumor is truly getting worse, then  stop neoadjuvant chemotherapy and refer her for surgery. In the meantime we will continue our treatment with chemotherapy with Taxol.  Return to clinic in 2 weeks for cycle 4/12.    Orders Placed This Encounter  Procedures  . MR Breast Bilateral W Contrast    Standing Status: Future     Number of Occurrences:      Standing Expiration Date: 03/22/2016    Order Specific Question:  Reason for Exam (SYMPTOM  OR DIAGNOSIS REQUIRED)    Answer:  Breast cancer increasing in size on neoadjuvant chemo    Order Specific Question:  Preferred imaging location?    Answer:  [facility]    Order Specific Question:  Does the patient have a pacemaker or implanted devices?    Answer:  No    Order Specific Question:  What is the patient's sedation requirement?    Answer:  No Sedation  . MR Breast Bilateral Wo Contrast    Standing  Status: Future     Number of Occurrences:      Standing Expiration Date: 03/22/2016    Order Specific Question:  Reason for Exam (SYMPTOM  OR DIAGNOSIS REQUIRED)    Answer:  Breast cancer increasing in size on neoadjuvant chemo    Order Specific Question:  Preferred imaging location?    Answer:  [facility]    Order Specific Question:  Does the patient have a pacemaker or implanted devices?    Answer:  No    Order Specific Question:  What is the patient's sedation requirement?    Answer:  No Sedation   The patient has a good understanding of the overall plan. she agrees with it. She will call with any problems that may develop before her next visit here.   Rulon Eisenmenger, MD

## 2015-03-23 NOTE — Patient Instructions (Signed)
[facility] Discharge Instructions for Patients Receiving Chemotherapy  Today you received the following chemotherapy agents taxol, carboplatin  To help prevent nausea and vomiting after your treatment, we encourage you to take your nausea medication as directed by MD   If you develop nausea and vomiting that is not controlled by your nausea medication, call the clinic.   BELOW ARE SYMPTOMS THAT SHOULD BE REPORTED IMMEDIATELY:  *FEVER GREATER THAN 100.5 F  *CHILLS WITH OR WITHOUT FEVER  NAUSEA AND VOMITING THAT IS NOT CONTROLLED WITH YOUR NAUSEA MEDICATION  *UNUSUAL SHORTNESS OF BREATH  *UNUSUAL BRUISING OR BLEEDING  TENDERNESS IN MOUTH AND THROAT WITH OR WITHOUT PRESENCE OF ULCERS  *URINARY PROBLEMS  *BOWEL PROBLEMS  UNUSUAL RASH Items with * indicate a potential emergency and should be followed up as soon as possible.  Feel free to call the clinic you have any questions or concerns. The clinic phone number is [phone].  Please show the [facility] at check-in to the Emergency Department and triage nurse.

## 2015-03-23 NOTE — Assessment & Plan Note (Signed)
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 Neutropenic fever admission after cycle 1. I debated whether or not we need to dose reduce, at this point I have elected to keep the dosage of chemotherapy the same for cycle 2. 2. Severe fatigue the week after chemotherapy 3. Denies any nausea vomiting mucositis or any other concerns  Bleeding/discharge from the breast lump: Patient believes that the tumor is getting larger. Because of this I would like to obtain an MRI of the breast to assess at midpoint. If the tumor is truly getting worse, then stop neoadjuvant chemotherapy and refer her for surgery. In the meantime we will continue our treatment with chemotherapy with Taxol.  Return to clinic in 2 weeks for cycle 4/12.

## 2015-03-27 ENCOUNTER — Telehealth: Payer: Self-pay

## 2015-03-27 NOTE — Telephone Encounter (Signed)
-----   Message from Brien Few, RN sent at 03/17/2015 10:17 AM EDT ----- Regarding: Chemo Follow up Call Patty Buckley. First Taxol/Carbo

## 2015-03-27 NOTE — Telephone Encounter (Signed)
Follow up call not done.  Desk RN not aware is was desk responsibility.  Pt seen in clinic on 4/14.

## 2015-03-28 ENCOUNTER — Other Ambulatory Visit: Payer: Self-pay | Admitting: Hematology and Oncology

## 2015-03-28 DIAGNOSIS — C50212 Malignant neoplasm of upper-inner quadrant of left female breast: Secondary | ICD-10-CM

## 2015-03-29 ENCOUNTER — Other Ambulatory Visit: Payer: Self-pay

## 2015-03-29 ENCOUNTER — Telehealth: Payer: Self-pay

## 2015-03-29 DIAGNOSIS — C50212 Malignant neoplasm of upper-inner quadrant of left female breast: Secondary | ICD-10-CM

## 2015-03-29 NOTE — Telephone Encounter (Signed)
[facility] and ultrasound for 4/25 at 1250.  Inbasket sent to [facility] for pre-auth.  Patient notified of appt d/t - voiced understanding.

## 2015-03-30 ENCOUNTER — Encounter: Payer: Self-pay | Admitting: *Deleted

## 2015-03-30 ENCOUNTER — Other Ambulatory Visit (HOSPITAL_BASED_OUTPATIENT_CLINIC_OR_DEPARTMENT_OTHER): Payer: Medicaid Other

## 2015-03-30 ENCOUNTER — Ambulatory Visit (HOSPITAL_BASED_OUTPATIENT_CLINIC_OR_DEPARTMENT_OTHER): Payer: Medicaid Other

## 2015-03-30 VITALS — BP 120/62 | HR 84 | Temp 98.1°F | Resp 18

## 2015-03-30 DIAGNOSIS — C50212 Malignant neoplasm of upper-inner quadrant of left female breast: Secondary | ICD-10-CM | POA: Diagnosis present

## 2015-03-30 DIAGNOSIS — Z5111 Encounter for antineoplastic chemotherapy: Secondary | ICD-10-CM | POA: Diagnosis present

## 2015-03-30 LAB — CBC WITH DIFFERENTIAL/PLATELET
BASO%: 1.2 % (ref 0.0–2.0)
Basophils Absolute: 0.1 10*3/uL (ref 0.0–0.1)
EOS ABS: 0 10*3/uL (ref 0.0–0.5)
EOS%: 0.8 % (ref 0.0–7.0)
HCT: 31.9 % — ABNORMAL LOW (ref 34.8–46.6)
HGB: 10.2 g/dL — ABNORMAL LOW (ref 11.6–15.9)
LYMPH#: 0.8 10*3/uL — AB (ref 0.9–3.3)
LYMPH%: 14.3 % (ref 14.0–49.7)
MCH: 32.7 pg (ref 25.1–34.0)
MCHC: 32 g/dL (ref 31.5–36.0)
MCV: 102.4 fL — ABNORMAL HIGH (ref 79.5–101.0)
MONO#: 0.3 10*3/uL (ref 0.1–0.9)
MONO%: 6.4 % (ref 0.0–14.0)
NEUT%: 77.3 % — ABNORMAL HIGH (ref 38.4–76.8)
NEUTROS ABS: 4.2 10*3/uL (ref 1.5–6.5)
Platelets: 292 10*3/uL (ref 145–400)
RBC: 3.12 10*6/uL — ABNORMAL LOW (ref 3.70–5.45)
RDW: 21.3 % — AB (ref 11.2–14.5)
WBC: 5.4 10*3/uL (ref 3.9–10.3)

## 2015-03-30 LAB — COMPREHENSIVE METABOLIC PANEL (CC13)
ALK PHOS: 70 U/L (ref 40–150)
ALT: 14 U/L (ref 0–55)
AST: 16 U/L (ref 5–34)
Albumin: 2.9 g/dL — ABNORMAL LOW (ref 3.5–5.0)
Anion Gap: 15 mEq/L — ABNORMAL HIGH (ref 3–11)
BUN: 5.2 mg/dL — ABNORMAL LOW (ref 7.0–26.0)
CALCIUM: 9.2 mg/dL (ref 8.4–10.4)
CO2: 22 mEq/L (ref 22–29)
CREATININE: 0.7 mg/dL (ref 0.6–1.1)
Chloride: 107 mEq/L (ref 98–109)
EGFR: >90 mL/min/{1.73_m2} (ref >90)
Glucose: 196 mg/dl — ABNORMAL HIGH (ref 70–140)
Potassium: 4.3 mEq/L (ref 3.5–5.1)
Sodium: 143 mEq/L (ref 136–145)
Total Bilirubin: 0.42 mg/dL (ref 0.20–1.20)
Total Protein: 6.1 g/dL — ABNORMAL LOW (ref 6.4–8.3)

## 2015-03-30 MED ORDER — FAMOTIDINE IN NACL 20-0.9 MG/50ML-% IV SOLN
INTRAVENOUS | Status: AC
  Filled 2015-03-30: qty 50

## 2015-03-30 MED ORDER — HEPARIN SOD (PORK) LOCK FLUSH 100 UNIT/ML IV SOLN
500.0000 [IU] | Freq: Once | INTRAVENOUS | Status: AC | PRN
  Administered 2015-03-30: 500 [IU]
  Filled 2015-03-30: qty 5

## 2015-03-30 MED ORDER — SODIUM CHLORIDE 0.9 % IV SOLN
Freq: Once | INTRAVENOUS | Status: AC
  Administered 2015-03-30: 12:00:00 via INTRAVENOUS

## 2015-03-30 MED ORDER — DIPHENHYDRAMINE HCL 50 MG/ML IJ SOLN
INTRAMUSCULAR | Status: AC
  Filled 2015-03-30: qty 1

## 2015-03-30 MED ORDER — FAMOTIDINE IN NACL 20-0.9 MG/50ML-% IV SOLN
20.0000 mg | Freq: Once | INTRAVENOUS | Status: AC
  Administered 2015-03-30: 20 mg via INTRAVENOUS

## 2015-03-30 MED ORDER — DIPHENHYDRAMINE HCL 50 MG/ML IJ SOLN
50.0000 mg | Freq: Once | INTRAMUSCULAR | Status: AC
  Administered 2015-03-30: 50 mg via INTRAVENOUS

## 2015-03-30 MED ORDER — SODIUM CHLORIDE 0.9 % IV SOLN
Freq: Once | INTRAVENOUS | Status: AC
  Administered 2015-03-30: 13:00:00 via INTRAVENOUS
  Filled 2015-03-30: qty 8

## 2015-03-30 MED ORDER — PACLITAXEL CHEMO INJECTION 300 MG/50ML
65.0000 mg/m2 | Freq: Once | INTRAVENOUS | Status: AC
  Administered 2015-03-30: 120 mg via INTRAVENOUS
  Filled 2015-03-30: qty 20

## 2015-03-30 MED ORDER — SODIUM CHLORIDE 0.9 % IJ SOLN
10.0000 mL | INTRAMUSCULAR | Status: DC | PRN
  Administered 2015-03-30: 10 mL
  Filled 2015-03-30: qty 10

## 2015-03-30 MED ORDER — CARBOPLATIN CHEMO INJECTION 450 MG/45ML
164.5500 mg | Freq: Once | INTRAVENOUS | Status: AC
  Administered 2015-03-30: 160 mg via INTRAVENOUS
  Filled 2015-03-30: qty 16

## 2015-03-30 NOTE — Progress Notes (Unsigned)
Met with pt during chemotherapy. Relate "everything is going as expected". Denies needs at this time. Encourage pt to call with questions or concerns. Received verbal understanding.

## 2015-03-30 NOTE — Patient Instructions (Signed)
[facility] Discharge Instructions for Patients Receiving Chemotherapy  Today you received the following chemotherapy agents taxol, carboplatin  To help prevent nausea and vomiting after your treatment, we encourage you to take your nausea medication as directed by MD   If you develop nausea and vomiting that is not controlled by your nausea medication, call the clinic.   BELOW ARE SYMPTOMS THAT SHOULD BE REPORTED IMMEDIATELY:  *FEVER GREATER THAN 100.5 F  *CHILLS WITH OR WITHOUT FEVER  NAUSEA AND VOMITING THAT IS NOT CONTROLLED WITH YOUR NAUSEA MEDICATION  *UNUSUAL SHORTNESS OF BREATH  *UNUSUAL BRUISING OR BLEEDING  TENDERNESS IN MOUTH AND THROAT WITH OR WITHOUT PRESENCE OF ULCERS  *URINARY PROBLEMS  *BOWEL PROBLEMS  UNUSUAL RASH Items with * indicate a potential emergency and should be followed up as soon as possible.  Feel free to call the clinic you have any questions or concerns. The clinic phone number is [phone].  Please show the [facility] at check-in to the Emergency Department and triage nurse.

## 2015-04-03 ENCOUNTER — Ambulatory Visit
Admission: RE | Admit: 2015-04-03 | Discharge: 2015-04-03 | Disposition: A | Discharge status: Home / Self Care | Payer: Medicaid Other | Source: Ambulatory Visit | Attending: Hematology and Oncology | Admitting: Hematology and Oncology

## 2015-04-03 DIAGNOSIS — C50212 Malignant neoplasm of upper-inner quadrant of left female breast: Secondary | ICD-10-CM

## 2015-04-06 ENCOUNTER — Ambulatory Visit (HOSPITAL_BASED_OUTPATIENT_CLINIC_OR_DEPARTMENT_OTHER): Payer: Medicaid Other | Admitting: Hematology and Oncology

## 2015-04-06 ENCOUNTER — Ambulatory Visit: Payer: Medicaid Other

## 2015-04-06 ENCOUNTER — Ambulatory Visit (HOSPITAL_BASED_OUTPATIENT_CLINIC_OR_DEPARTMENT_OTHER): Payer: Medicaid Other

## 2015-04-06 ENCOUNTER — Telehealth: Payer: Self-pay | Admitting: Hematology and Oncology

## 2015-04-06 ENCOUNTER — Other Ambulatory Visit: Payer: Self-pay | Admitting: Hematology and Oncology

## 2015-04-06 ENCOUNTER — Other Ambulatory Visit (HOSPITAL_BASED_OUTPATIENT_CLINIC_OR_DEPARTMENT_OTHER): Payer: Medicaid Other

## 2015-04-06 ENCOUNTER — Other Ambulatory Visit: Payer: Medicaid Other

## 2015-04-06 VITALS — BP 134/70 | HR 100 | Temp 97.5°F | Resp 18 | Ht 63.0 in | Wt 156.1 lb

## 2015-04-06 DIAGNOSIS — C50212 Malignant neoplasm of upper-inner quadrant of left female breast: Secondary | ICD-10-CM

## 2015-04-06 DIAGNOSIS — C773 Secondary and unspecified malignant neoplasm of axilla and upper limb lymph nodes: Secondary | ICD-10-CM

## 2015-04-06 DIAGNOSIS — Z5111 Encounter for antineoplastic chemotherapy: Secondary | ICD-10-CM

## 2015-04-06 DIAGNOSIS — Z171 Estrogen receptor negative status [ER-]: Secondary | ICD-10-CM | POA: Diagnosis not present

## 2015-04-06 DIAGNOSIS — D701 Agranulocytosis secondary to cancer chemotherapy: Secondary | ICD-10-CM

## 2015-04-06 LAB — CBC WITH DIFFERENTIAL/PLATELET
BASO%: 0.8 % (ref 0.0–2.0)
BASOS ABS: 0.1 10*3/uL (ref 0.0–0.1)
EOS%: 0.8 % (ref 0.0–7.0)
Eosinophils Absolute: 0.1 10*3/uL (ref 0.0–0.5)
HCT: 32.2 % — ABNORMAL LOW (ref 34.8–46.6)
HEMOGLOBIN: 10.4 g/dL — AB (ref 11.6–15.9)
LYMPH%: 15.5 % (ref 14.0–49.7)
MCH: 33.6 pg (ref 25.1–34.0)
MCHC: 32.3 g/dL (ref 31.5–36.0)
MCV: 104 fL — AB (ref 79.5–101.0)
MONO#: 0.5 10*3/uL (ref 0.1–0.9)
MONO%: 7.8 % (ref 0.0–14.0)
NEUT#: 5 10*3/uL (ref 1.5–6.5)
NEUT%: 75.1 % (ref 38.4–76.8)
Platelets: 313 10*3/uL (ref 145–400)
RBC: 3.1 10*6/uL — ABNORMAL LOW (ref 3.70–5.45)
RDW: 20.3 % — ABNORMAL HIGH (ref 11.2–14.5)
WBC: 6.7 10*3/uL (ref 3.9–10.3)
lymph#: 1 10*3/uL (ref 0.9–3.3)

## 2015-04-06 LAB — COMPREHENSIVE METABOLIC PANEL (CC13)
ALBUMIN: 3.3 g/dL — AB (ref 3.5–5.0)
ALK PHOS: 73 U/L (ref 40–150)
ALT: 36 U/L (ref 0–55)
AST: 48 U/L — ABNORMAL HIGH (ref 5–34)
Anion Gap: 14 mEq/L — ABNORMAL HIGH (ref 3–11)
BUN: 8.1 mg/dL (ref 7.0–26.0)
CALCIUM: 9 mg/dL (ref 8.4–10.4)
CHLORIDE: 107 meq/L (ref 98–109)
CO2: 21 mEq/L — ABNORMAL LOW (ref 22–29)
Creatinine: 0.7 mg/dL (ref 0.6–1.1)
EGFR: >90 mL/min/{1.73_m2} (ref >90)
Glucose: 99 mg/dl (ref 70–140)
Potassium: 4.9 mEq/L (ref 3.5–5.1)
SODIUM: 142 meq/L (ref 136–145)
TOTAL PROTEIN: 6.3 g/dL — AB (ref 6.4–8.3)
Total Bilirubin: 0.49 mg/dL (ref 0.20–1.20)

## 2015-04-06 MED ORDER — FAMOTIDINE IN NACL 20-0.9 MG/50ML-% IV SOLN
INTRAVENOUS | Status: AC
  Filled 2015-04-06: qty 50

## 2015-04-06 MED ORDER — SODIUM CHLORIDE 0.9 % IV SOLN
160.0000 mg | Freq: Once | INTRAVENOUS | Status: AC
  Administered 2015-04-06: 160 mg via INTRAVENOUS
  Filled 2015-04-06: qty 16

## 2015-04-06 MED ORDER — SODIUM CHLORIDE 0.9 % IV SOLN
Freq: Once | INTRAVENOUS | Status: AC
  Administered 2015-04-06: 15:00:00 via INTRAVENOUS

## 2015-04-06 MED ORDER — SODIUM CHLORIDE 0.9 % IV SOLN
Freq: Once | INTRAVENOUS | Status: AC
  Administered 2015-04-06: 16:00:00 via INTRAVENOUS
  Filled 2015-04-06: qty 8

## 2015-04-06 MED ORDER — HEPARIN SOD (PORK) LOCK FLUSH 100 UNIT/ML IV SOLN
500.0000 [IU] | Freq: Once | INTRAVENOUS | Status: AC | PRN
  Administered 2015-04-06: 500 [IU]
  Filled 2015-04-06: qty 5

## 2015-04-06 MED ORDER — FAMOTIDINE IN NACL 20-0.9 MG/50ML-% IV SOLN
20.0000 mg | Freq: Once | INTRAVENOUS | Status: AC
  Administered 2015-04-06: 20 mg via INTRAVENOUS

## 2015-04-06 MED ORDER — DIPHENHYDRAMINE HCL 50 MG/ML IJ SOLN
INTRAMUSCULAR | Status: AC
  Filled 2015-04-06: qty 1

## 2015-04-06 MED ORDER — PACLITAXEL CHEMO INJECTION 300 MG/50ML
65.0000 mg/m2 | Freq: Once | INTRAVENOUS | Status: AC
  Administered 2015-04-06: 120 mg via INTRAVENOUS
  Filled 2015-04-06: qty 20

## 2015-04-06 MED ORDER — SODIUM CHLORIDE 0.9 % IJ SOLN
10.0000 mL | INTRAMUSCULAR | Status: DC | PRN
  Administered 2015-04-06: 10 mL
  Filled 2015-04-06: qty 10

## 2015-04-06 MED ORDER — DIPHENHYDRAMINE HCL 50 MG/ML IJ SOLN
50.0000 mg | Freq: Once | INTRAMUSCULAR | Status: AC
  Administered 2015-04-06: 50 mg via INTRAVENOUS

## 2015-04-06 NOTE — Progress Notes (Signed)
Patient Care Team: Antony Blackbird, MD as PCP - General (Family Medicine)  DIAGNOSIS: Breast cancer of upper-inner quadrant of left female breast   Staging form: Breast, AJCC 7th Edition     Clinical: Stage IIIB (T4b, N0, M0) - Signed by Rulon Eisenmenger, MD on 01/17/2015   SUMMARY OF ONCOLOGIC HISTORY:   Breast cancer of upper-inner quadrant of left female breast   12/27/2014 Initial Diagnosis Grade 3 invasive mammary cancer, one lymph node negative in left axilla, ER 0%, PR 0%, Ki-67 98%, HER-2 negative ratio 1.38   01/06/2015 Breast MRI Left breast large enhancing mass, evidence of necrosis, extending from posterior through the middle third of left breast, extending from chest wall to the dermis, close to pectoralis fascia extending 4.5 cm, several bilateral axillary lymph nodes   01/17/2015 -  Neo-Adjuvant Chemotherapy Neoadjuvant chemotherapy with dose dense Adriamycin and Cytoxan followed by Taxol and carboplatin   01/24/2015 - 01/27/2015 Hospital Admission Neutropenic fever admission, no source identified    CHIEF COMPLIANT: week 4/12 Taxol and carboplatin  INTERVAL HISTORY: Patty Buckley is a 64 year old lady with above-mentioned history of left breast cancer currently on neoadjuvant chemotherapy. She is receiving Taxol and carboplatin. She is tolerating it fairly well with very minimal neuropathy in the tips of the fingers. Denies any nausea vomiting or fevers or chills. She had a mammogram recently to evaluate the tumor is getting worse. The mammogram suggested that there is an immediate response to neoadjuvant treatment.  REVIEW OF SYSTEMS:   Constitutional: Denies fevers, chills or abnormal weight loss Eyes: Denies blurriness of vision Ears, nose, mouth, throat, and face: Denies mucositis or sore throat Respiratory: Denies cough, dyspnea or wheezes Cardiovascular: Denies palpitation, chest discomfort or lower extremity swelling Gastrointestinal:  Denies nausea, heartburn or change in  bowel habits Skin: Denies abnormal skin rashes Lymphatics: Denies new lymphadenopathy or easy bruising Neurological:Denies numbness, tingling or new weaknesses Behavioral/Psych: Mood is stable, no new changes  Breast:right breast mass stable All other systems were reviewed with the patient and are negative.  I have reviewed the past medical history, past surgical history, social history and family history with the patient and they are unchanged from previous note.  ALLERGIES:  is allergic to codeine.  MEDICATIONS:  Current Outpatient Prescriptions  Medication Sig Dispense Refill  . HYDROcodone-acetaminophen (NORCO/VICODIN) 5-325 MG per tablet Take 1-2 tablets by mouth every 4 (four) hours as needed for moderate pain. 30 tablet 0  . lisinopril (PRINIVIL,ZESTRIL) 10 MG tablet Take 1 tablet (10 mg total) by mouth daily. Take 10 mg by mouth every morning. 30 tablet 3  . LORazepam (ATIVAN) 0.5 MG tablet Take 1 tablet (0.5 mg total) by mouth every 6 (six) hours as needed (Nausea or vomiting). 30 tablet 0  . metoprolol (LOPRESSOR) 50 MG tablet Take 1 tablet (50 mg total) by mouth 2 (two) times daily. 180 tablet 3  . Multiple Vitamin (MULTIVITAMIN) tablet Take 1 tablet by mouth daily.     . ondansetron (ZOFRAN) 8 MG tablet Take 1 tablet (8 mg total) by mouth 2 (two) times daily as needed. Start on the third day after chemotherapy. 30 tablet 1  . aspirin 81 MG tablet Take 81 mg by mouth daily.       No current facility-administered medications for this visit.    PHYSICAL EXAMINATION: ECOG PERFORMANCE STATUS: 1 - Symptomatic but completely ambulatory  Filed Vitals:   04/06/15 1400  BP: 134/70  Pulse: 100  Temp: 97.5 F (36.4 C)  Resp: 18   Filed Weights   04/06/15 1400  Weight: 156 lb 1.6 oz (70.806 kg)    GENERAL:alert, no distress and comfortable SKIN: skin color, texture, turgor are normal, no rashes or significant lesions EYES: normal, Conjunctiva are pink and non-injected,  sclera clear OROPHARYNX:no exudate, no erythema and lips, buccal mucosa, and tongue normal  NECK: supple, thyroid normal size, non-tender, without nodularity LYMPH:  no palpable lymphadenopathy in the cervical, axillary or inguinal LUNGS: clear to auscultation and percussion with normal breathing effort HEART: regular rate & rhythm and no murmurs and no lower extremity edema ABDOMEN:abdomen soft, non-tender and normal bowel sounds Musculoskeletal:no cyanosis of digits and no clubbing  NEURO: alert & oriented x 3 with fluent speech, no focal motor/sensory deficits, mild neuropathy of the tips of the fingers.   LABORATORY DATA:  I have reviewed the data as listed   Chemistry      Component Value Date/Time   NA 143 03/30/2015 1113   NA 140 02/13/2015 1130   K 4.3 03/30/2015 1113   K 4.3 02/13/2015 1130   CL 107 02/13/2015 1130   CO2 22 03/30/2015 1113   CO2 25 02/13/2015 1130   BUN 5.2* 03/30/2015 1113   BUN 8 02/13/2015 1130   CREATININE 0.7 03/30/2015 1113   CREATININE 0.74 02/13/2015 1130      Component Value Date/Time   CALCIUM 9.2 03/30/2015 1113   CALCIUM 9.0 02/13/2015 1130   ALKPHOS 70 03/30/2015 1113   ALKPHOS 53 01/25/2015 0500   AST 16 03/30/2015 1113   AST 12 01/25/2015 0500   ALT 14 03/30/2015 1113   ALT 12 01/25/2015 0500   BILITOT 0.42 03/30/2015 1113   BILITOT 0.5 01/25/2015 0500       Lab Results  Component Value Date   WBC 6.7 04/06/2015   HGB 10.4* 04/06/2015   HCT 32.2* 04/06/2015   MCV 104.0* 04/06/2015   PLT 313 04/06/2015   NEUTROABS 5.0 04/06/2015     ASSESSMENT & PLAN:  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 Neutropenic fever admission after cycle 1. I debated whether or not we need to dose reduce, at this point I have elected to keep the dosage of chemotherapy the same for cycle 2. 2. Severe fatigue the week after chemotherapy 3. Denies any nausea vomiting mucositis or any other concerns  Radiology review: Because of patient's concern that the tumor may be getting bigger we obtained another diagnostic left mammogram which revealed that the mass is actually smaller 5.4 cm previously now it is 4.8 cm, ultrasound could not be performed because of the appearance of the mass and protrusion. This suggests response to therapy. So we will plan to continue and finish up our entire treatment as planned.    No orders of the defined types were placed in this encounter.   The patient has a good understanding of the overall plan. she agrees with it. She will call with any problems that may develop before her next visit here.   Rulon Eisenmenger, MD

## 2015-04-06 NOTE — Patient Instructions (Addendum)
[facility] Discharge Instructions for Patients Receiving Chemotherapy  Today you received the following chemotherapy agents: Taxol, Carboplatin  To help prevent nausea and vomiting after your treatment, we encourage you to take your nausea medication: Zofran. Take one every 8 hours as needed.   If you develop nausea and vomiting that is not controlled by your nausea medication, call the clinic.   BELOW ARE SYMPTOMS THAT SHOULD BE REPORTED IMMEDIATELY:  *FEVER GREATER THAN 100.5 F  *CHILLS WITH OR WITHOUT FEVER  NAUSEA AND VOMITING THAT IS NOT CONTROLLED WITH YOUR NAUSEA MEDICATION  *UNUSUAL SHORTNESS OF BREATH  *UNUSUAL BRUISING OR BLEEDING  TENDERNESS IN MOUTH AND THROAT WITH OR WITHOUT PRESENCE OF ULCERS  *URINARY PROBLEMS  *BOWEL PROBLEMS  UNUSUAL RASH Items with * indicate a potential emergency and should be followed up as soon as possible.  Feel free to call the clinic should you have any questions or concerns. The clinic phone number is [phone].  Please show the [facility] at check-in to the Emergency Department and triage nurse.

## 2015-04-06 NOTE — Assessment & Plan Note (Signed)
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 Neutropenic fever admission after cycle 1. I debated whether or not we need to dose reduce, at this point I have elected to keep the dosage of chemotherapy the same for cycle 2. 2. Severe fatigue the week after chemotherapy 3. Denies any nausea vomiting mucositis or any other concerns  Radiology review: Because of patient's concern that the tumor may be getting bigger we obtained another diagnostic left mammogram which revealed that the mass is actually smaller 5.4 cm previously now it is 4.8 cm, ultrasound could not be performed because of the appearance of the mass and protrusion. This suggests response to therapy. So we will plan to continue and finish up our entire treatment as planned.

## 2015-04-06 NOTE — Telephone Encounter (Signed)
Pt confirmed labs/ov per 04/28 POF, gave pt AVS and Calendar.... KJ, sent msg to add chemo

## 2015-04-13 ENCOUNTER — Ambulatory Visit (HOSPITAL_BASED_OUTPATIENT_CLINIC_OR_DEPARTMENT_OTHER): Payer: Medicare Other

## 2015-04-13 ENCOUNTER — Ambulatory Visit (HOSPITAL_BASED_OUTPATIENT_CLINIC_OR_DEPARTMENT_OTHER): Payer: Medicare Other | Admitting: Hematology and Oncology

## 2015-04-13 ENCOUNTER — Other Ambulatory Visit (HOSPITAL_BASED_OUTPATIENT_CLINIC_OR_DEPARTMENT_OTHER): Payer: Medicare Other

## 2015-04-13 VITALS — BP 133/62 | HR 86 | Temp 97.6°F | Resp 18 | Ht 63.0 in | Wt 155.4 lb

## 2015-04-13 DIAGNOSIS — C50212 Malignant neoplasm of upper-inner quadrant of left female breast: Secondary | ICD-10-CM | POA: Diagnosis present

## 2015-04-13 DIAGNOSIS — D701 Agranulocytosis secondary to cancer chemotherapy: Secondary | ICD-10-CM

## 2015-04-13 DIAGNOSIS — Z171 Estrogen receptor negative status [ER-]: Secondary | ICD-10-CM | POA: Diagnosis not present

## 2015-04-13 DIAGNOSIS — Z5111 Encounter for antineoplastic chemotherapy: Secondary | ICD-10-CM | POA: Diagnosis present

## 2015-04-13 DIAGNOSIS — C773 Secondary and unspecified malignant neoplasm of axilla and upper limb lymph nodes: Secondary | ICD-10-CM

## 2015-04-13 LAB — CBC WITH DIFFERENTIAL/PLATELET
BASO%: 0.8 % (ref 0.0–2.0)
BASOS ABS: 0 10*3/uL (ref 0.0–0.1)
EOS%: 1 % (ref 0.0–7.0)
Eosinophils Absolute: 0.1 10*3/uL (ref 0.0–0.5)
HCT: 33.5 % — ABNORMAL LOW (ref 34.8–46.6)
HGB: 11 g/dL — ABNORMAL LOW (ref 11.6–15.9)
LYMPH%: 24.3 % (ref 14.0–49.7)
MCH: 34.7 pg — ABNORMAL HIGH (ref 25.1–34.0)
MCHC: 32.8 g/dL (ref 31.5–36.0)
MCV: 105.7 fL — AB (ref 79.5–101.0)
MONO#: 0.3 10*3/uL (ref 0.1–0.9)
MONO%: 6.7 % (ref 0.0–14.0)
NEUT#: 3.3 10*3/uL (ref 1.5–6.5)
NEUT%: 67.2 % (ref 38.4–76.8)
Platelets: 204 10*3/uL (ref 145–400)
RBC: 3.17 10*6/uL — AB (ref 3.70–5.45)
RDW: 17.9 % — ABNORMAL HIGH (ref 11.2–14.5)
WBC: 4.9 10*3/uL (ref 3.9–10.3)
lymph#: 1.2 10*3/uL (ref 0.9–3.3)

## 2015-04-13 LAB — COMPREHENSIVE METABOLIC PANEL (CC13)
ALT: 19 U/L (ref 0–55)
ANION GAP: 14 meq/L — AB (ref 3–11)
AST: 17 U/L (ref 5–34)
Albumin: 3.3 g/dL — ABNORMAL LOW (ref 3.5–5.0)
Alkaline Phosphatase: 77 U/L (ref 40–150)
BUN: 5.9 mg/dL — ABNORMAL LOW (ref 7.0–26.0)
CO2: 20 mEq/L — ABNORMAL LOW (ref 22–29)
CREATININE: 0.7 mg/dL (ref 0.6–1.1)
Calcium: 9.4 mg/dL (ref 8.4–10.4)
Chloride: 107 mEq/L (ref 98–109)
EGFR: >90 mL/min/{1.73_m2} (ref >90)
Glucose: 116 mg/dl (ref 70–140)
Potassium: 3.9 mEq/L (ref 3.5–5.1)
Sodium: 142 mEq/L (ref 136–145)
Total Bilirubin: 0.67 mg/dL (ref 0.20–1.20)
Total Protein: 6.2 g/dL — ABNORMAL LOW (ref 6.4–8.3)

## 2015-04-13 MED ORDER — DIPHENHYDRAMINE HCL 50 MG/ML IJ SOLN
50.0000 mg | Freq: Once | INTRAMUSCULAR | Status: AC
  Administered 2015-04-13: 50 mg via INTRAVENOUS

## 2015-04-13 MED ORDER — FAMOTIDINE IN NACL 20-0.9 MG/50ML-% IV SOLN
20.0000 mg | Freq: Once | INTRAVENOUS | Status: AC
  Administered 2015-04-13: 20 mg via INTRAVENOUS

## 2015-04-13 MED ORDER — DIPHENHYDRAMINE HCL 50 MG/ML IJ SOLN
INTRAMUSCULAR | Status: AC
  Filled 2015-04-13: qty 1

## 2015-04-13 MED ORDER — SODIUM CHLORIDE 0.9 % IV SOLN
Freq: Once | INTRAVENOUS | Status: AC
  Administered 2015-04-13: 13:00:00 via INTRAVENOUS

## 2015-04-13 MED ORDER — HEPARIN SOD (PORK) LOCK FLUSH 100 UNIT/ML IV SOLN
500.0000 [IU] | Freq: Once | INTRAVENOUS | Status: AC | PRN
  Administered 2015-04-13: 500 [IU]
  Filled 2015-04-13: qty 5

## 2015-04-13 MED ORDER — FAMOTIDINE IN NACL 20-0.9 MG/50ML-% IV SOLN
INTRAVENOUS | Status: AC
  Filled 2015-04-13: qty 50

## 2015-04-13 MED ORDER — SODIUM CHLORIDE 0.9 % IV SOLN
Freq: Once | INTRAVENOUS | Status: AC
  Administered 2015-04-13: 13:00:00 via INTRAVENOUS
  Filled 2015-04-13: qty 8

## 2015-04-13 MED ORDER — CARBOPLATIN CHEMO INJECTION 450 MG/45ML
164.5500 mg | Freq: Once | INTRAVENOUS | Status: AC
  Administered 2015-04-13: 160 mg via INTRAVENOUS
  Filled 2015-04-13: qty 16

## 2015-04-13 MED ORDER — PACLITAXEL CHEMO INJECTION 300 MG/50ML
65.0000 mg/m2 | Freq: Once | INTRAVENOUS | Status: AC
  Administered 2015-04-13: 120 mg via INTRAVENOUS
  Filled 2015-04-13: qty 20

## 2015-04-13 MED ORDER — SODIUM CHLORIDE 0.9 % IJ SOLN
10.0000 mL | INTRAMUSCULAR | Status: DC | PRN
  Administered 2015-04-13: 10 mL
  Filled 2015-04-13: qty 10

## 2015-04-13 NOTE — Patient Instructions (Signed)
[facility] Discharge Instructions for Patients Receiving Chemotherapy  Today you received the following chemotherapy agents Taxol and Carboplatin.  To help prevent nausea and vomiting after your treatment, we encourage you to take your nausea medication as prescribed.   If you develop nausea and vomiting that is not controlled by your nausea medication, call the clinic.   BELOW ARE SYMPTOMS THAT SHOULD BE REPORTED IMMEDIATELY:  *FEVER GREATER THAN 100.5 F  *CHILLS WITH OR WITHOUT FEVER  NAUSEA AND VOMITING THAT IS NOT CONTROLLED WITH YOUR NAUSEA MEDICATION  *UNUSUAL SHORTNESS OF BREATH  *UNUSUAL BRUISING OR BLEEDING  TENDERNESS IN MOUTH AND THROAT WITH OR WITHOUT PRESENCE OF ULCERS  *URINARY PROBLEMS  *BOWEL PROBLEMS  UNUSUAL RASH Items with * indicate a potential emergency and should be followed up as soon as possible.  Feel free to call the clinic you have any questions or concerns. The clinic phone number is [phone].  Please show the CHEMO ALERT CARD at check-in to the Emergency Department and triage nurse.   

## 2015-04-13 NOTE — Addendum Note (Signed)
Addended by: Prentiss Bells on: 04/13/2015 12:26 PM   Modules accepted: Medications

## 2015-04-13 NOTE — Progress Notes (Signed)
Patient Care Team: Antony Blackbird, MD as PCP - General (Family Medicine)  DIAGNOSIS: Breast cancer of upper-inner quadrant of left female breast   Staging form: Breast, AJCC 7th Edition     Clinical: Stage IIIB (T4b, N0, M0) - Signed by Rulon Eisenmenger, MD on 01/17/2015   SUMMARY OF ONCOLOGIC HISTORY:   Breast cancer of upper-inner quadrant of left female breast   12/27/2014 Initial Diagnosis Grade 3 invasive mammary cancer, one lymph node negative in left axilla, ER 0%, PR 0%, Ki-67 98%, HER-2 negative ratio 1.38   01/06/2015 Breast MRI Left breast large enhancing mass, evidence of necrosis, extending from posterior through the middle third of left breast, extending from chest wall to the dermis, close to pectoralis fascia extending 4.5 cm, several bilateral axillary lymph nodes   01/17/2015 -  Neo-Adjuvant Chemotherapy Neoadjuvant chemotherapy with dose dense Adriamycin and Cytoxan followed by Taxol and carboplatin   01/24/2015 - 01/27/2015 Hospital Admission Neutropenic fever admission, no source identified    CHIEF COMPLIANT: Follow-up on Taxol and carboplatin neoadjuvant chemotherapy week 5/12  INTERVAL HISTORY: Patty Buckley is a 64 year old lady with above-mentioned history of left breast cancer treated with neoadjuvant chemotherapy. She is currently on Taxol and carboplatin. She is tolerating the chemotherapy fairly well. Last week she had an elevation of AST and she is here today with a normal AST level. The pain in the left breast is much improved. She thinks that the tumor is now smaller than before. Denies any neuropathy or fevers or chills.  REVIEW OF SYSTEMS:   Constitutional: Denies fevers, chills or abnormal weight loss Eyes: Denies blurriness of vision Ears, nose, mouth, throat, and face: Denies mucositis or sore throat Respiratory: Denies cough, dyspnea or wheezes Cardiovascular: Denies palpitation, chest discomfort or lower extremity swelling Gastrointestinal:  Denies nausea,  heartburn or change in bowel habits Skin: Denies abnormal skin rashes Lymphatics: Denies new lymphadenopathy or easy bruising Neurological:Denies numbness, tingling or new weaknesses Behavioral/Psych: Mood is stable, no new changes  Breast: Left breast lump is slightly smaller All other systems were reviewed with the patient and are negative.  I have reviewed the past medical history, past surgical history, social history and family history with the patient and they are unchanged from previous note.  ALLERGIES:  is allergic to codeine.  MEDICATIONS:  Current Outpatient Prescriptions  Medication Sig Dispense Refill  . aspirin 81 MG tablet Take 81 mg by mouth daily.      Marland Kitchen HYDROcodone-acetaminophen (NORCO/VICODIN) 5-325 MG per tablet Take 1-2 tablets by mouth every 4 (four) hours as needed for moderate pain. 30 tablet 0  . lisinopril (PRINIVIL,ZESTRIL) 10 MG tablet Take 1 tablet (10 mg total) by mouth daily. Take 10 mg by mouth every morning. 30 tablet 3  . LORazepam (ATIVAN) 0.5 MG tablet Take 1 tablet (0.5 mg total) by mouth every 6 (six) hours as needed (Nausea or vomiting). 30 tablet 0  . metoprolol (LOPRESSOR) 50 MG tablet Take 1 tablet (50 mg total) by mouth 2 (two) times daily. 180 tablet 3  . Multiple Vitamin (MULTIVITAMIN) tablet Take 1 tablet by mouth daily.     . ondansetron (ZOFRAN) 8 MG tablet Take 1 tablet (8 mg total) by mouth 2 (two) times daily as needed. Start on the third day after chemotherapy. 30 tablet 1   No current facility-administered medications for this visit.    PHYSICAL EXAMINATION: ECOG PERFORMANCE STATUS: 1 - Symptomatic but completely ambulatory  Filed Vitals:   04/13/15 1144  BP:  133/62  Pulse: 86  Temp: 97.6 F (36.4 C)  Resp: 18   Filed Weights   04/13/15 1144  Weight: 155 lb 6.4 oz (70.489 kg)    GENERAL:alert, no distress and comfortable SKIN: skin color, texture, turgor are normal, no rashes or significant lesions EYES: normal,  Conjunctiva are pink and non-injected, sclera clear OROPHARYNX:no exudate, no erythema and lips, buccal mucosa, and tongue normal  NECK: supple, thyroid normal size, non-tender, without nodularity LYMPH:  no palpable lymphadenopathy in the cervical, axillary or inguinal LUNGS: clear to auscultation and percussion with normal breathing effort HEART: regular rate & rhythm and no murmurs and no lower extremity edema ABDOMEN:abdomen soft, non-tender and normal bowel sounds Musculoskeletal:no cyanosis of digits and no clubbing  NEURO: alert & oriented x 3 with fluent speech, no focal motor/sensory deficits BREAST:*Large fungating tumor in the left breast slightly smaller than before as much softer.  LABORATORY DATA:  I have reviewed the data as listed   Chemistry      Component Value Date/Time   NA 142 04/13/2015 1117   NA 140 02/13/2015 1130   K 3.9 04/13/2015 1117   K 4.3 02/13/2015 1130   CL 107 02/13/2015 1130   CO2 20* 04/13/2015 1117   CO2 25 02/13/2015 1130   BUN 5.9* 04/13/2015 1117   BUN 8 02/13/2015 1130   CREATININE 0.7 04/13/2015 1117   CREATININE 0.74 02/13/2015 1130      Component Value Date/Time   CALCIUM 9.4 04/13/2015 1117   CALCIUM 9.0 02/13/2015 1130   ALKPHOS 77 04/13/2015 1117   ALKPHOS 53 01/25/2015 0500   AST 17 04/13/2015 1117   AST 12 01/25/2015 0500   ALT 19 04/13/2015 1117   ALT 12 01/25/2015 0500   BILITOT 0.67 04/13/2015 1117   BILITOT 0.5 01/25/2015 0500       Lab Results  Component Value Date   WBC 4.9 04/13/2015   HGB 11.0* 04/13/2015   HCT 33.5* 04/13/2015   MCV 105.7* 04/13/2015   PLT 204 04/13/2015   NEUTROABS 3.3 04/13/2015    ASSESSMENT & PLAN:  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 Neutropenic fever admission after cycle 1. I debated whether or not we need to dose reduce, at this point I have elected to keep the dosage of chemotherapy the same for cycle 2. 2. Severe fatigue the week after chemotherapy 3. Denies any nausea vomiting mucositis or any other concerns 4. Elevated AST after fourth Taxol and carbo normalized by cycle 5  Monitoring closely for toxicities Return to clinic in 2 weeks for follow-up    No orders of the defined types were placed in this encounter.   The patient has a good understanding of the overall plan. she agrees with it. she will call with any problems that may develop before the next visit here.   Rulon Eisenmenger, MD

## 2015-04-13 NOTE — Assessment & Plan Note (Signed)
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 Neutropenic fever admission after cycle 1. I debated whether or not we need to dose reduce, at this point I have elected to keep the dosage of chemotherapy the same for cycle 2. 2. Severe fatigue the week after chemotherapy 3. Denies any nausea vomiting mucositis or any other concerns 4. Elevated AST after fourth Taxol and carbo normalized by cycle 5  Monitoring closely for toxicities Return to clinic in 2 weeks for follow-up

## 2015-04-20 ENCOUNTER — Ambulatory Visit (HOSPITAL_BASED_OUTPATIENT_CLINIC_OR_DEPARTMENT_OTHER): Payer: Medicare Other

## 2015-04-20 ENCOUNTER — Other Ambulatory Visit (HOSPITAL_BASED_OUTPATIENT_CLINIC_OR_DEPARTMENT_OTHER): Payer: Medicare Other

## 2015-04-20 VITALS — BP 139/72 | HR 76 | Temp 97.9°F | Resp 16

## 2015-04-20 DIAGNOSIS — C50212 Malignant neoplasm of upper-inner quadrant of left female breast: Secondary | ICD-10-CM

## 2015-04-20 DIAGNOSIS — Z5111 Encounter for antineoplastic chemotherapy: Secondary | ICD-10-CM | POA: Diagnosis not present

## 2015-04-20 DIAGNOSIS — D701 Agranulocytosis secondary to cancer chemotherapy: Secondary | ICD-10-CM

## 2015-04-20 LAB — COMPREHENSIVE METABOLIC PANEL (CC13)
ALBUMIN: 3.2 g/dL — AB (ref 3.5–5.0)
ALT: 24 U/L (ref 0–55)
ANION GAP: 9 meq/L (ref 3–11)
AST: 20 U/L (ref 5–34)
Alkaline Phosphatase: 70 U/L (ref 40–150)
BUN: 9.9 mg/dL (ref 7.0–26.0)
CHLORIDE: 108 meq/L (ref 98–109)
CO2: 23 mEq/L (ref 22–29)
Calcium: 9 mg/dL (ref 8.4–10.4)
Creatinine: 0.6 mg/dL (ref 0.6–1.1)
EGFR: >90 mL/min/{1.73_m2} (ref >90)
GLUCOSE: 97 mg/dL (ref 70–140)
POTASSIUM: 4.1 meq/L (ref 3.5–5.1)
SODIUM: 141 meq/L (ref 136–145)
TOTAL PROTEIN: 5.7 g/dL — AB (ref 6.4–8.3)
Total Bilirubin: 0.53 mg/dL (ref 0.20–1.20)

## 2015-04-20 LAB — CBC WITH DIFFERENTIAL/PLATELET
BASO%: 0.4 % (ref 0.0–2.0)
Basophils Absolute: 0 10*3/uL (ref 0.0–0.1)
EOS%: 0.9 % (ref 0.0–7.0)
Eosinophils Absolute: 0 10*3/uL (ref 0.0–0.5)
HCT: 32.5 % — ABNORMAL LOW (ref 34.8–46.6)
HGB: 10.5 g/dL — ABNORMAL LOW (ref 11.6–15.9)
LYMPH%: 23.7 % (ref 14.0–49.7)
MCH: 34.9 pg — ABNORMAL HIGH (ref 25.1–34.0)
MCHC: 32.3 g/dL (ref 31.5–36.0)
MCV: 108 fL — AB (ref 79.5–101.0)
MONO#: 0.3 10*3/uL (ref 0.1–0.9)
MONO%: 5.8 % (ref 0.0–14.0)
NEUT#: 3.2 10*3/uL (ref 1.5–6.5)
NEUT%: 69.2 % (ref 38.4–76.8)
PLATELETS: 177 10*3/uL (ref 145–400)
RBC: 3.01 10*6/uL — AB (ref 3.70–5.45)
RDW: 17.4 % — ABNORMAL HIGH (ref 11.2–14.5)
WBC: 4.7 10*3/uL (ref 3.9–10.3)
lymph#: 1.1 10*3/uL (ref 0.9–3.3)

## 2015-04-20 MED ORDER — FAMOTIDINE IN NACL 20-0.9 MG/50ML-% IV SOLN
INTRAVENOUS | Status: AC
  Filled 2015-04-20: qty 50

## 2015-04-20 MED ORDER — DIPHENHYDRAMINE HCL 50 MG/ML IJ SOLN
INTRAMUSCULAR | Status: AC
  Filled 2015-04-20: qty 1

## 2015-04-20 MED ORDER — SODIUM CHLORIDE 0.9 % IV SOLN
Freq: Once | INTRAVENOUS | Status: AC
  Administered 2015-04-20: 13:00:00 via INTRAVENOUS
  Filled 2015-04-20: qty 8

## 2015-04-20 MED ORDER — PACLITAXEL CHEMO INJECTION 300 MG/50ML
65.0000 mg/m2 | Freq: Once | INTRAVENOUS | Status: AC
  Administered 2015-04-20: 120 mg via INTRAVENOUS
  Filled 2015-04-20: qty 20

## 2015-04-20 MED ORDER — SODIUM CHLORIDE 0.9 % IV SOLN
164.5500 mg | Freq: Once | INTRAVENOUS | Status: AC
  Administered 2015-04-20: 160 mg via INTRAVENOUS
  Filled 2015-04-20: qty 16

## 2015-04-20 MED ORDER — DIPHENHYDRAMINE HCL 50 MG/ML IJ SOLN
50.0000 mg | Freq: Once | INTRAMUSCULAR | Status: AC
  Administered 2015-04-20: 50 mg via INTRAVENOUS

## 2015-04-20 MED ORDER — SODIUM CHLORIDE 0.9 % IV SOLN
Freq: Once | INTRAVENOUS | Status: AC
  Administered 2015-04-20: 12:00:00 via INTRAVENOUS

## 2015-04-20 MED ORDER — SODIUM CHLORIDE 0.9 % IJ SOLN
10.0000 mL | INTRAMUSCULAR | Status: DC | PRN
  Administered 2015-04-20: 10 mL
  Filled 2015-04-20: qty 10

## 2015-04-20 MED ORDER — HEPARIN SOD (PORK) LOCK FLUSH 100 UNIT/ML IV SOLN
500.0000 [IU] | Freq: Once | INTRAVENOUS | Status: AC | PRN
  Administered 2015-04-20: 500 [IU]
  Filled 2015-04-20: qty 5

## 2015-04-20 MED ORDER — FAMOTIDINE IN NACL 20-0.9 MG/50ML-% IV SOLN
20.0000 mg | Freq: Once | INTRAVENOUS | Status: AC
  Administered 2015-04-20: 20 mg via INTRAVENOUS

## 2015-04-20 NOTE — Patient Instructions (Signed)
[facility] Discharge Instructions for Patients Receiving Chemotherapy  Today you received the following chemotherapy agents Taxol and Carboplatin.  To help prevent nausea and vomiting after your treatment, we encourage you to take your nausea medication as prescribed.   If you develop nausea and vomiting that is not controlled by your nausea medication, call the clinic.   BELOW ARE SYMPTOMS THAT SHOULD BE REPORTED IMMEDIATELY:  *FEVER GREATER THAN 100.5 F  *CHILLS WITH OR WITHOUT FEVER  NAUSEA AND VOMITING THAT IS NOT CONTROLLED WITH YOUR NAUSEA MEDICATION  *UNUSUAL SHORTNESS OF BREATH  *UNUSUAL BRUISING OR BLEEDING  TENDERNESS IN MOUTH AND THROAT WITH OR WITHOUT PRESENCE OF ULCERS  *URINARY PROBLEMS  *BOWEL PROBLEMS  UNUSUAL RASH Items with * indicate a potential emergency and should be followed up as soon as possible.  Feel free to call the clinic you have any questions or concerns. The clinic phone number is [phone].  Please show the CHEMO ALERT CARD at check-in to the Emergency Department and triage nurse.   

## 2015-04-27 ENCOUNTER — Ambulatory Visit (HOSPITAL_BASED_OUTPATIENT_CLINIC_OR_DEPARTMENT_OTHER): Payer: Medicare Other | Admitting: Hematology and Oncology

## 2015-04-27 ENCOUNTER — Telehealth: Payer: Self-pay | Admitting: Hematology and Oncology

## 2015-04-27 ENCOUNTER — Ambulatory Visit (HOSPITAL_BASED_OUTPATIENT_CLINIC_OR_DEPARTMENT_OTHER): Payer: Medicare Other

## 2015-04-27 ENCOUNTER — Other Ambulatory Visit (HOSPITAL_BASED_OUTPATIENT_CLINIC_OR_DEPARTMENT_OTHER): Payer: Medicare Other

## 2015-04-27 VITALS — BP 148/64 | HR 85 | Temp 97.5°F | Resp 18 | Ht 63.0 in | Wt 159.8 lb

## 2015-04-27 DIAGNOSIS — D6481 Anemia due to antineoplastic chemotherapy: Secondary | ICD-10-CM

## 2015-04-27 DIAGNOSIS — C50212 Malignant neoplasm of upper-inner quadrant of left female breast: Secondary | ICD-10-CM | POA: Diagnosis present

## 2015-04-27 DIAGNOSIS — Z5111 Encounter for antineoplastic chemotherapy: Secondary | ICD-10-CM | POA: Diagnosis present

## 2015-04-27 DIAGNOSIS — Z171 Estrogen receptor negative status [ER-]: Secondary | ICD-10-CM | POA: Diagnosis not present

## 2015-04-27 LAB — COMPREHENSIVE METABOLIC PANEL (CC13)
ALBUMIN: 3.3 g/dL — AB (ref 3.5–5.0)
ALK PHOS: 67 U/L (ref 40–150)
ALT: 18 U/L (ref 0–55)
AST: 19 U/L (ref 5–34)
Anion Gap: 12 mEq/L — ABNORMAL HIGH (ref 3–11)
BUN: 10.1 mg/dL (ref 7.0–26.0)
CO2: 24 meq/L (ref 22–29)
Calcium: 9.2 mg/dL (ref 8.4–10.4)
Chloride: 107 mEq/L (ref 98–109)
Creatinine: 0.7 mg/dL (ref 0.6–1.1)
EGFR: >90 mL/min/{1.73_m2} (ref >90)
GLUCOSE: 129 mg/dL (ref 70–140)
POTASSIUM: 3.1 meq/L — AB (ref 3.5–5.1)
Sodium: 142 mEq/L (ref 136–145)
TOTAL PROTEIN: 6.1 g/dL — AB (ref 6.4–8.3)
Total Bilirubin: 0.45 mg/dL (ref 0.20–1.20)

## 2015-04-27 LAB — CBC WITH DIFFERENTIAL/PLATELET
BASO%: 0.7 % (ref 0.0–2.0)
BASOS ABS: 0 10*3/uL (ref 0.0–0.1)
EOS%: 0.8 % (ref 0.0–7.0)
Eosinophils Absolute: 0 10*3/uL (ref 0.0–0.5)
HCT: 30.7 % — ABNORMAL LOW (ref 34.8–46.6)
HEMOGLOBIN: 10.2 g/dL — AB (ref 11.6–15.9)
LYMPH%: 30.3 % (ref 14.0–49.7)
MCH: 36 pg — ABNORMAL HIGH (ref 25.1–34.0)
MCHC: 33.3 g/dL (ref 31.5–36.0)
MCV: 107.9 fL — ABNORMAL HIGH (ref 79.5–101.0)
MONO#: 0.4 10*3/uL (ref 0.1–0.9)
MONO%: 8.3 % (ref 0.0–14.0)
NEUT#: 2.8 10*3/uL (ref 1.5–6.5)
NEUT%: 59.9 % (ref 38.4–76.8)
PLATELETS: 190 10*3/uL (ref 145–400)
RBC: 2.84 10*6/uL — ABNORMAL LOW (ref 3.70–5.45)
RDW: 18 % — ABNORMAL HIGH (ref 11.2–14.5)
WBC: 4.6 10*3/uL (ref 3.9–10.3)
lymph#: 1.4 10*3/uL (ref 0.9–3.3)

## 2015-04-27 MED ORDER — HEPARIN SOD (PORK) LOCK FLUSH 100 UNIT/ML IV SOLN
500.0000 [IU] | Freq: Once | INTRAVENOUS | Status: AC | PRN
  Administered 2015-04-27: 500 [IU]
  Filled 2015-04-27: qty 5

## 2015-04-27 MED ORDER — SODIUM CHLORIDE 0.9 % IJ SOLN
10.0000 mL | INTRAMUSCULAR | Status: DC | PRN
Start: 2015-04-27 — End: 2015-04-27
  Administered 2015-04-27: 10 mL
  Filled 2015-04-27: qty 10

## 2015-04-27 MED ORDER — PACLITAXEL CHEMO INJECTION 300 MG/50ML
65.0000 mg/m2 | Freq: Once | INTRAVENOUS | Status: AC
  Administered 2015-04-27: 120 mg via INTRAVENOUS
  Filled 2015-04-27: qty 20

## 2015-04-27 MED ORDER — FAMOTIDINE IN NACL 20-0.9 MG/50ML-% IV SOLN
INTRAVENOUS | Status: AC
  Filled 2015-04-27: qty 50

## 2015-04-27 MED ORDER — FAMOTIDINE IN NACL 20-0.9 MG/50ML-% IV SOLN
20.0000 mg | Freq: Once | INTRAVENOUS | Status: AC
  Administered 2015-04-27: 20 mg via INTRAVENOUS

## 2015-04-27 MED ORDER — SODIUM CHLORIDE 0.9 % IV SOLN
Freq: Once | INTRAVENOUS | Status: AC
  Administered 2015-04-27: 12:00:00 via INTRAVENOUS

## 2015-04-27 MED ORDER — SODIUM CHLORIDE 0.9 % IV SOLN
Freq: Once | INTRAVENOUS | Status: AC
  Administered 2015-04-27: 12:00:00 via INTRAVENOUS
  Filled 2015-04-27: qty 8

## 2015-04-27 MED ORDER — DIPHENHYDRAMINE HCL 50 MG/ML IJ SOLN
50.0000 mg | Freq: Once | INTRAMUSCULAR | Status: AC
  Administered 2015-04-27: 50 mg via INTRAVENOUS

## 2015-04-27 MED ORDER — DIPHENHYDRAMINE HCL 50 MG/ML IJ SOLN
INTRAMUSCULAR | Status: AC
  Filled 2015-04-27: qty 1

## 2015-04-27 MED ORDER — CARBOPLATIN CHEMO INJECTION 450 MG/45ML
164.5500 mg | Freq: Once | INTRAVENOUS | Status: AC
  Administered 2015-04-27: 160 mg via INTRAVENOUS
  Filled 2015-04-27: qty 16

## 2015-04-27 NOTE — Patient Instructions (Addendum)
[facility] Discharge Instructions for Patients Receiving Chemotherapy  Today you received the following chemotherapy agents taxol/carboplatin   To help prevent nausea and vomiting after your treatment, we encourage you to take your nausea medication as directed  If you develop nausea and vomiting that is not controlled by your nausea medication, call the clinic.   BELOW ARE SYMPTOMS THAT SHOULD BE REPORTED IMMEDIATELY:  *FEVER GREATER THAN 100.5 F  *CHILLS WITH OR WITHOUT FEVER  NAUSEA AND VOMITING THAT IS NOT CONTROLLED WITH YOUR NAUSEA MEDICATION  *UNUSUAL SHORTNESS OF BREATH  *UNUSUAL BRUISING OR BLEEDING  TENDERNESS IN MOUTH AND THROAT WITH OR WITHOUT PRESENCE OF ULCERS  *URINARY PROBLEMS  *BOWEL PROBLEMS  UNUSUAL RASH Items with * indicate a potential emergency and should be followed up as soon as possible.  Feel free to call the clinic you have any questions or concerns. The clinic phone number is [phone].  Please show the CHEMO ALERT CARD at check-in to the Emergency Department and triage nurse.   

## 2015-04-27 NOTE — Telephone Encounter (Signed)
Gave avs & calendar for May & June °

## 2015-04-27 NOTE — Progress Notes (Signed)
Patient Care Team: Antony Blackbird, MD as PCP - General (Family Medicine)  DIAGNOSIS: Breast cancer of upper-inner quadrant of left female breast   Staging form: Breast, AJCC 7th Edition     Clinical: Stage IIIB (T4b, N0, M0) - Signed by Rulon Eisenmenger, MD on 01/17/2015   SUMMARY OF ONCOLOGIC HISTORY:   Breast cancer of upper-inner quadrant of left female breast   12/27/2014 Initial Diagnosis Grade 3 invasive mammary cancer, one lymph node negative in left axilla, ER 0%, PR 0%, Ki-67 98%, HER-2 negative ratio 1.38   01/06/2015 Breast MRI Left breast large enhancing mass, evidence of necrosis, extending from posterior through the middle third of left breast, extending from chest wall to the dermis, close to pectoralis fascia extending 4.5 cm, several bilateral axillary lymph nodes   01/17/2015 -  Neo-Adjuvant Chemotherapy Neoadjuvant chemotherapy with dose dense Adriamycin and Cytoxan followed by Taxol and carboplatin   01/24/2015 - 01/27/2015 Hospital Admission Neutropenic fever admission, no source identified    CHIEF COMPLIANT: Week 7/12 Taxol and carboplatin INTERVAL HISTORY: Patty Buckley is a 64 year old with above-mentioned history of left breast cancer with extension into the dermis all the way to the chest currently on neoadjuvant chemotherapy. She is currently on Taxol and carboplatin. She is tolerating it fairly well. Denies any neuropathy. Denies any nausea or vomiting. She does feel weak about the fourth day after chemotherapy and then she recovers. She takes half a tablet of Decadron for the 2 days when she feels tired and she feels better after that.  REVIEW OF SYSTEMS:   Constitutional: Denies fevers, chills or abnormal weight loss Eyes: Denies blurriness of vision Ears, nose, mouth, throat, and face: Denies mucositis or sore throat Respiratory: Denies cough, dyspnea or wheezes Cardiovascular: Denies palpitation, chest discomfort or lower extremity swelling Gastrointestinal:  Denies  nausea, heartburn or change in bowel habits Skin: Denies abnormal skin rashes Lymphatics: Denies new lymphadenopathy or easy bruising Neurological:Denies numbness, tingling or new weaknesses Behavioral/Psych: Mood is stable, no new changes  Breast:  Breast lump is smaller All other systems were reviewed with the patient and are negative.  I have reviewed the past medical history, past surgical history, social history and family history with the patient and they are unchanged from previous note.  ALLERGIES:  is allergic to codeine.  MEDICATIONS:  Current Outpatient Prescriptions  Medication Sig Dispense Refill  . aspirin 81 MG tablet Take 81 mg by mouth daily.      Marland Kitchen HYDROcodone-acetaminophen (NORCO/VICODIN) 5-325 MG per tablet Take 1-2 tablets by mouth every 4 (four) hours as needed for moderate pain. 30 tablet 0  . lisinopril (PRINIVIL,ZESTRIL) 10 MG tablet Take 1 tablet (10 mg total) by mouth daily. Take 10 mg by mouth every morning. 30 tablet 3  . LORazepam (ATIVAN) 0.5 MG tablet Take 1 tablet (0.5 mg total) by mouth every 6 (six) hours as needed (Nausea or vomiting). 30 tablet 0  . metoprolol (LOPRESSOR) 50 MG tablet Take 1 tablet (50 mg total) by mouth 2 (two) times daily. 180 tablet 3  . Multiple Vitamin (MULTIVITAMIN) tablet Take 1 tablet by mouth daily.     . ondansetron (ZOFRAN) 8 MG tablet Take 1 tablet (8 mg total) by mouth 2 (two) times daily as needed. Start on the third day after chemotherapy. 30 tablet 1   No current facility-administered medications for this visit.    PHYSICAL EXAMINATION: ECOG PERFORMANCE STATUS: 1 - Symptomatic but completely ambulatory  Filed Vitals:   04/27/15 1036  BP: 148/64  Pulse: 85  Temp: 97.5 F (36.4 C)  Resp: 18   Filed Weights   04/27/15 1036  Weight: 159 lb 12.8 oz (72.485 kg)    GENERAL:alert, no distress and comfortable SKIN: skin color, texture, turgor are normal, no rashes or significant lesions EYES: normal, Conjunctiva  are pink and non-injected, sclera clear OROPHARYNX:no exudate, no erythema and lips, buccal mucosa, and tongue normal  NECK: supple, thyroid normal size, non-tender, without nodularity LYMPH:  no palpable lymphadenopathy in the cervical, axillary or inguinal LUNGS: clear to auscultation and percussion with normal breathing effort HEART: regular rate & rhythm and no murmurs and no lower extremity edema ABDOMEN:abdomen soft, non-tender and normal bowel sounds Musculoskeletal:no cyanosis of digits and no clubbing  NEURO: alert & oriented x 3 with fluent speech, no focal motor/sensory deficits   LABORATORY DATA:  I have reviewed the data as listed   Chemistry      Component Value Date/Time   NA 141 04/20/2015 1119   NA 140 02/13/2015 1130   K 4.1 04/20/2015 1119   K 4.3 02/13/2015 1130   CL 107 02/13/2015 1130   CO2 23 04/20/2015 1119   CO2 25 02/13/2015 1130   BUN 9.9 04/20/2015 1119   BUN 8 02/13/2015 1130   CREATININE 0.6 04/20/2015 1119   CREATININE 0.74 02/13/2015 1130      Component Value Date/Time   CALCIUM 9.0 04/20/2015 1119   CALCIUM 9.0 02/13/2015 1130   ALKPHOS 70 04/20/2015 1119   ALKPHOS 53 01/25/2015 0500   AST 20 04/20/2015 1119   AST 12 01/25/2015 0500   ALT 24 04/20/2015 1119   ALT 12 01/25/2015 0500   BILITOT 0.53 04/20/2015 1119   BILITOT 0.5 01/25/2015 0500       Lab Results  Component Value Date   WBC 4.6 04/27/2015   HGB 10.2* 04/27/2015   HCT 30.7* 04/27/2015   MCV 107.9* 04/27/2015   PLT 190 04/27/2015   NEUTROABS 2.8 04/27/2015    ASSESSMENT & PLAN:  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 Neutropenic fever admission after cycle 1. I debated whether or not we need to dose reduce, at this point I have elected to keep the dosage of chemotherapy the same for cycle 2. 2. Severe fatigue the week after chemotherapy 3. Denies any nausea vomiting mucositis or any other concerns 4. Elevated AST after fourth Taxol and carbo normalized by cycle 5 5. Anemia due to chemotherapy: The monitor  Monitoring closely for toxicities Return to clinic in 2 weeks for follow-up  No orders of the defined types were placed in this encounter.   The patient has a good understanding of the overall plan. she agrees with it. she will call with any problems that may develop before the next visit here.   Rulon Eisenmenger, MD   Right knee and he

## 2015-04-27 NOTE — Assessment & Plan Note (Signed)
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 Neutropenic fever admission after cycle 1. I debated whether or not we need to dose reduce, at this point I have elected to keep the dosage of chemotherapy the same for cycle 2. 2. Severe fatigue the week after chemotherapy 3. Denies any nausea vomiting mucositis or any other concerns 4. Elevated AST after fourth Taxol and carbo normalized by cycle 5  Monitoring closely for toxicities Return to clinic in 2 weeks for follow-up

## 2015-05-04 ENCOUNTER — Other Ambulatory Visit (HOSPITAL_BASED_OUTPATIENT_CLINIC_OR_DEPARTMENT_OTHER): Payer: Medicare Other

## 2015-05-04 ENCOUNTER — Ambulatory Visit (HOSPITAL_BASED_OUTPATIENT_CLINIC_OR_DEPARTMENT_OTHER): Payer: Medicare Other

## 2015-05-04 VITALS — BP 125/67 | HR 92

## 2015-05-04 DIAGNOSIS — C50212 Malignant neoplasm of upper-inner quadrant of left female breast: Secondary | ICD-10-CM | POA: Diagnosis present

## 2015-05-04 DIAGNOSIS — D6481 Anemia due to antineoplastic chemotherapy: Secondary | ICD-10-CM

## 2015-05-04 DIAGNOSIS — Z5111 Encounter for antineoplastic chemotherapy: Secondary | ICD-10-CM

## 2015-05-04 LAB — CBC WITH DIFFERENTIAL/PLATELET
BASO%: 0.7 % (ref 0.0–2.0)
Basophils Absolute: 0 10*3/uL (ref 0.0–0.1)
EOS%: 0.8 % (ref 0.0–7.0)
Eosinophils Absolute: 0 10*3/uL (ref 0.0–0.5)
HCT: 32.1 % — ABNORMAL LOW (ref 34.8–46.6)
HEMOGLOBIN: 10.7 g/dL — AB (ref 11.6–15.9)
LYMPH%: 25.1 % (ref 14.0–49.7)
MCH: 36.6 pg — ABNORMAL HIGH (ref 25.1–34.0)
MCHC: 33.2 g/dL (ref 31.5–36.0)
MCV: 110 fL — ABNORMAL HIGH (ref 79.5–101.0)
MONO#: 0.3 10*3/uL (ref 0.1–0.9)
MONO%: 9.4 % (ref 0.0–14.0)
NEUT#: 2.2 10*3/uL (ref 1.5–6.5)
NEUT%: 64 % (ref 38.4–76.8)
Platelets: 213 10*3/uL (ref 145–400)
RBC: 2.92 10*6/uL — ABNORMAL LOW (ref 3.70–5.45)
RDW: 18.8 % — ABNORMAL HIGH (ref 11.2–14.5)
WBC: 3.4 10*3/uL — ABNORMAL LOW (ref 3.9–10.3)
lymph#: 0.9 10*3/uL (ref 0.9–3.3)

## 2015-05-04 LAB — COMPREHENSIVE METABOLIC PANEL (CC13)
ALK PHOS: 67 U/L (ref 40–150)
ALT: 20 U/L (ref 0–55)
AST: 17 U/L (ref 5–34)
Albumin: 3.1 g/dL — ABNORMAL LOW (ref 3.5–5.0)
Anion Gap: 8 mEq/L (ref 3–11)
BUN: 13.2 mg/dL (ref 7.0–26.0)
CALCIUM: 8.6 mg/dL (ref 8.4–10.4)
CO2: 23 mEq/L (ref 22–29)
CREATININE: 0.6 mg/dL (ref 0.6–1.1)
Chloride: 109 mEq/L (ref 98–109)
GLUCOSE: 169 mg/dL — AB (ref 70–140)
Potassium: 3.9 mEq/L (ref 3.5–5.1)
Sodium: 140 mEq/L (ref 136–145)
TOTAL PROTEIN: 5.9 g/dL — AB (ref 6.4–8.3)
Total Bilirubin: 0.39 mg/dL (ref 0.20–1.20)

## 2015-05-04 MED ORDER — PACLITAXEL CHEMO INJECTION 300 MG/50ML
65.0000 mg/m2 | Freq: Once | INTRAVENOUS | Status: AC
  Administered 2015-05-04: 120 mg via INTRAVENOUS
  Filled 2015-05-04: qty 20

## 2015-05-04 MED ORDER — DIPHENHYDRAMINE HCL 50 MG/ML IJ SOLN
INTRAMUSCULAR | Status: AC
Start: 2015-05-04 — End: 2015-05-04
  Filled 2015-05-04: qty 1

## 2015-05-04 MED ORDER — SODIUM CHLORIDE 0.9 % IJ SOLN
10.0000 mL | INTRAMUSCULAR | Status: DC | PRN
  Administered 2015-05-04: 10 mL
  Filled 2015-05-04: qty 10

## 2015-05-04 MED ORDER — FAMOTIDINE IN NACL 20-0.9 MG/50ML-% IV SOLN
INTRAVENOUS | Status: AC
  Filled 2015-05-04: qty 50

## 2015-05-04 MED ORDER — SODIUM CHLORIDE 0.9 % IV SOLN
137.7000 mg | Freq: Once | INTRAVENOUS | Status: AC
  Administered 2015-05-04: 140 mg via INTRAVENOUS
  Filled 2015-05-04: qty 14

## 2015-05-04 MED ORDER — SODIUM CHLORIDE 0.9 % IV SOLN
Freq: Once | INTRAVENOUS | Status: AC
  Administered 2015-05-04: 13:00:00 via INTRAVENOUS
  Filled 2015-05-04: qty 8

## 2015-05-04 MED ORDER — FAMOTIDINE IN NACL 20-0.9 MG/50ML-% IV SOLN
20.0000 mg | Freq: Once | INTRAVENOUS | Status: AC
  Administered 2015-05-04: 20 mg via INTRAVENOUS

## 2015-05-04 MED ORDER — DIPHENHYDRAMINE HCL 50 MG/ML IJ SOLN
50.0000 mg | Freq: Once | INTRAMUSCULAR | Status: AC
  Administered 2015-05-04: 50 mg via INTRAVENOUS

## 2015-05-04 MED ORDER — HEPARIN SOD (PORK) LOCK FLUSH 100 UNIT/ML IV SOLN
500.0000 [IU] | Freq: Once | INTRAVENOUS | Status: AC | PRN
  Administered 2015-05-04: 500 [IU]
  Filled 2015-05-04: qty 5

## 2015-05-04 MED ORDER — SODIUM CHLORIDE 0.9 % IV SOLN
Freq: Once | INTRAVENOUS | Status: AC
  Administered 2015-05-04: 13:00:00 via INTRAVENOUS

## 2015-05-04 NOTE — Patient Instructions (Signed)
[facility] Discharge Instructions for Patients Receiving Chemotherapy  Today you received the following chemotherapy agents Taxol and Carboplatin  To help prevent nausea and vomiting after your treatment, we encourage you to take your nausea medication     If you develop nausea and vomiting that is not controlled by your nausea medication, call the clinic.   BELOW ARE SYMPTOMS THAT SHOULD BE REPORTED IMMEDIATELY:  *FEVER GREATER THAN 100.5 F  *CHILLS WITH OR WITHOUT FEVER  NAUSEA AND VOMITING THAT IS NOT CONTROLLED WITH YOUR NAUSEA MEDICATION  *UNUSUAL SHORTNESS OF BREATH  *UNUSUAL BRUISING OR BLEEDING  TENDERNESS IN MOUTH AND THROAT WITH OR WITHOUT PRESENCE OF ULCERS  *URINARY PROBLEMS  *BOWEL PROBLEMS  UNUSUAL RASH Items with * indicate a potential emergency and should be followed up as soon as possible.  Feel free to call the clinic you have any questions or concerns. The clinic phone number is [phone].  Please show the CHEMO ALERT CARD at check-in to the Emergency Department and triage nurse.   

## 2015-05-11 ENCOUNTER — Other Ambulatory Visit (HOSPITAL_BASED_OUTPATIENT_CLINIC_OR_DEPARTMENT_OTHER): Payer: Medicare Other

## 2015-05-11 ENCOUNTER — Ambulatory Visit (HOSPITAL_BASED_OUTPATIENT_CLINIC_OR_DEPARTMENT_OTHER): Payer: Medicare Other

## 2015-05-11 VITALS — BP 134/66 | HR 89 | Temp 97.9°F | Resp 18

## 2015-05-11 DIAGNOSIS — C50212 Malignant neoplasm of upper-inner quadrant of left female breast: Secondary | ICD-10-CM

## 2015-05-11 DIAGNOSIS — D701 Agranulocytosis secondary to cancer chemotherapy: Secondary | ICD-10-CM | POA: Diagnosis not present

## 2015-05-11 DIAGNOSIS — C773 Secondary and unspecified malignant neoplasm of axilla and upper limb lymph nodes: Secondary | ICD-10-CM

## 2015-05-11 DIAGNOSIS — Z5111 Encounter for antineoplastic chemotherapy: Secondary | ICD-10-CM

## 2015-05-11 LAB — COMPREHENSIVE METABOLIC PANEL (CC13)
ALT: 22 U/L (ref 0–55)
ANION GAP: 8 meq/L (ref 3–11)
AST: 19 U/L (ref 5–34)
Albumin: 3.2 g/dL — ABNORMAL LOW (ref 3.5–5.0)
Alkaline Phosphatase: 66 U/L (ref 40–150)
BILIRUBIN TOTAL: 0.52 mg/dL (ref 0.20–1.20)
BUN: 12.3 mg/dL (ref 7.0–26.0)
CO2: 26 mEq/L (ref 22–29)
Calcium: 9.2 mg/dL (ref 8.4–10.4)
Chloride: 107 mEq/L (ref 98–109)
Creatinine: 0.7 mg/dL (ref 0.6–1.1)
Glucose: 151 mg/dl — ABNORMAL HIGH (ref 70–140)
Potassium: 3.9 mEq/L (ref 3.5–5.1)
Sodium: 141 mEq/L (ref 136–145)
Total Protein: 6 g/dL — ABNORMAL LOW (ref 6.4–8.3)

## 2015-05-11 LAB — CBC WITH DIFFERENTIAL/PLATELET
BASO%: 0.6 % (ref 0.0–2.0)
BASOS ABS: 0 10*3/uL (ref 0.0–0.1)
EOS%: 0.9 % (ref 0.0–7.0)
Eosinophils Absolute: 0 10*3/uL (ref 0.0–0.5)
HCT: 33.2 % — ABNORMAL LOW (ref 34.8–46.6)
HGB: 10.8 g/dL — ABNORMAL LOW (ref 11.6–15.9)
LYMPH#: 0.9 10*3/uL (ref 0.9–3.3)
LYMPH%: 27.6 % (ref 14.0–49.7)
MCH: 36.1 pg — ABNORMAL HIGH (ref 25.1–34.0)
MCHC: 32.5 g/dL (ref 31.5–36.0)
MCV: 111 fL — AB (ref 79.5–101.0)
MONO#: 0.3 10*3/uL (ref 0.1–0.9)
MONO%: 7.9 % (ref 0.0–14.0)
NEUT%: 63 % (ref 38.4–76.8)
NEUTROS ABS: 2.1 10*3/uL (ref 1.5–6.5)
PLATELETS: 210 10*3/uL (ref 145–400)
RBC: 2.99 10*6/uL — AB (ref 3.70–5.45)
RDW: 17 % — ABNORMAL HIGH (ref 11.2–14.5)
WBC: 3.3 10*3/uL — ABNORMAL LOW (ref 3.9–10.3)

## 2015-05-11 MED ORDER — FAMOTIDINE IN NACL 20-0.9 MG/50ML-% IV SOLN
INTRAVENOUS | Status: AC
  Filled 2015-05-11: qty 50

## 2015-05-11 MED ORDER — SODIUM CHLORIDE 0.9 % IV SOLN
Freq: Once | INTRAVENOUS | Status: AC
  Administered 2015-05-11: 13:00:00 via INTRAVENOUS

## 2015-05-11 MED ORDER — SODIUM CHLORIDE 0.9 % IV SOLN
Freq: Once | INTRAVENOUS | Status: AC
  Administered 2015-05-11: 13:00:00 via INTRAVENOUS
  Filled 2015-05-11: qty 8

## 2015-05-11 MED ORDER — PACLITAXEL CHEMO INJECTION 300 MG/50ML
65.0000 mg/m2 | Freq: Once | INTRAVENOUS | Status: AC
  Administered 2015-05-11: 120 mg via INTRAVENOUS
  Filled 2015-05-11: qty 20

## 2015-05-11 MED ORDER — SODIUM CHLORIDE 0.9 % IV SOLN
137.7000 mg | Freq: Once | INTRAVENOUS | Status: AC
  Administered 2015-05-11: 140 mg via INTRAVENOUS
  Filled 2015-05-11: qty 14

## 2015-05-11 MED ORDER — SODIUM CHLORIDE 0.9 % IJ SOLN
10.0000 mL | INTRAMUSCULAR | Status: DC | PRN
  Administered 2015-05-11: 10 mL
  Filled 2015-05-11: qty 10

## 2015-05-11 MED ORDER — DIPHENHYDRAMINE HCL 50 MG/ML IJ SOLN
50.0000 mg | Freq: Once | INTRAMUSCULAR | Status: AC
  Administered 2015-05-11: 50 mg via INTRAVENOUS

## 2015-05-11 MED ORDER — FAMOTIDINE IN NACL 20-0.9 MG/50ML-% IV SOLN
20.0000 mg | Freq: Once | INTRAVENOUS | Status: AC
  Administered 2015-05-11: 20 mg via INTRAVENOUS

## 2015-05-11 MED ORDER — HEPARIN SOD (PORK) LOCK FLUSH 100 UNIT/ML IV SOLN
500.0000 [IU] | Freq: Once | INTRAVENOUS | Status: AC | PRN
  Administered 2015-05-11: 500 [IU]
  Filled 2015-05-11: qty 5

## 2015-05-11 MED ORDER — DIPHENHYDRAMINE HCL 50 MG/ML IJ SOLN
INTRAMUSCULAR | Status: AC
  Filled 2015-05-11: qty 1

## 2015-05-11 NOTE — Patient Instructions (Signed)
[facility] Discharge Instructions for Patients Receiving Chemotherapy  Today you received the following chemotherapy agents Taxol/Carboplatin To help prevent nausea and vomiting after your treatment, we encourage you to take your nausea medication as presribed.  If you develop nausea and vomiting that is not controlled by your nausea medication, call the clinic.   BELOW ARE SYMPTOMS THAT SHOULD BE REPORTED IMMEDIATELY:  *FEVER GREATER THAN 100.5 F  *CHILLS WITH OR WITHOUT FEVER  NAUSEA AND VOMITING THAT IS NOT CONTROLLED WITH YOUR NAUSEA MEDICATION  *UNUSUAL SHORTNESS OF BREATH  *UNUSUAL BRUISING OR BLEEDING  TENDERNESS IN MOUTH AND THROAT WITH OR WITHOUT PRESENCE OF ULCERS  *URINARY PROBLEMS  *BOWEL PROBLEMS  UNUSUAL RASH Items with * indicate a potential emergency and should be followed up as soon as possible.  Feel free to call the clinic you have any questions or concerns. The clinic phone number is [phone].  Please show the [facility] at check-in to the Emergency Department and triage nurse.

## 2015-05-18 ENCOUNTER — Encounter: Payer: Self-pay | Admitting: Nurse Practitioner

## 2015-05-18 ENCOUNTER — Other Ambulatory Visit (HOSPITAL_BASED_OUTPATIENT_CLINIC_OR_DEPARTMENT_OTHER): Payer: Medicare Other

## 2015-05-18 ENCOUNTER — Telehealth: Payer: Self-pay | Admitting: Nurse Practitioner

## 2015-05-18 ENCOUNTER — Ambulatory Visit (HOSPITAL_BASED_OUTPATIENT_CLINIC_OR_DEPARTMENT_OTHER): Payer: Medicare Other | Admitting: Nurse Practitioner

## 2015-05-18 ENCOUNTER — Ambulatory Visit (HOSPITAL_BASED_OUTPATIENT_CLINIC_OR_DEPARTMENT_OTHER): Payer: Medicare Other

## 2015-05-18 VITALS — BP 138/74 | HR 99 | Temp 98.1°F | Resp 18 | Ht 63.0 in | Wt 161.1 lb

## 2015-05-18 DIAGNOSIS — Z5111 Encounter for antineoplastic chemotherapy: Secondary | ICD-10-CM | POA: Diagnosis present

## 2015-05-18 DIAGNOSIS — C50212 Malignant neoplasm of upper-inner quadrant of left female breast: Secondary | ICD-10-CM | POA: Diagnosis not present

## 2015-05-18 DIAGNOSIS — D6481 Anemia due to antineoplastic chemotherapy: Secondary | ICD-10-CM

## 2015-05-18 DIAGNOSIS — Z171 Estrogen receptor negative status [ER-]: Secondary | ICD-10-CM | POA: Diagnosis not present

## 2015-05-18 LAB — CBC WITH DIFFERENTIAL/PLATELET
BASO%: 0.2 % (ref 0.0–2.0)
BASOS ABS: 0 10*3/uL (ref 0.0–0.1)
EOS%: 0.7 % (ref 0.0–7.0)
Eosinophils Absolute: 0 10*3/uL (ref 0.0–0.5)
HCT: 33.8 % — ABNORMAL LOW (ref 34.8–46.6)
HGB: 11.4 g/dL — ABNORMAL LOW (ref 11.6–15.9)
LYMPH%: 20.2 % (ref 14.0–49.7)
MCH: 37.3 pg — ABNORMAL HIGH (ref 25.1–34.0)
MCHC: 33.7 g/dL (ref 31.5–36.0)
MCV: 110.5 fL — ABNORMAL HIGH (ref 79.5–101.0)
MONO#: 0.3 10*3/uL (ref 0.1–0.9)
MONO%: 6.5 % (ref 0.0–14.0)
NEUT#: 2.9 10*3/uL (ref 1.5–6.5)
NEUT%: 72.4 % (ref 38.4–76.8)
Platelets: 242 10*3/uL (ref 145–400)
RBC: 3.06 10*6/uL — AB (ref 3.70–5.45)
RDW: 16.7 % — AB (ref 11.2–14.5)
WBC: 4 10*3/uL (ref 3.9–10.3)
lymph#: 0.8 10*3/uL — ABNORMAL LOW (ref 0.9–3.3)

## 2015-05-18 LAB — COMPREHENSIVE METABOLIC PANEL (CC13)
ALK PHOS: 73 U/L (ref 40–150)
ALT: 19 U/L (ref 0–55)
ANION GAP: 9 meq/L (ref 3–11)
AST: 23 U/L (ref 5–34)
Albumin: 3.3 g/dL — ABNORMAL LOW (ref 3.5–5.0)
BILIRUBIN TOTAL: 0.59 mg/dL (ref 0.20–1.20)
BUN: 12 mg/dL (ref 7.0–26.0)
CO2: 27 mEq/L (ref 22–29)
CREATININE: 0.7 mg/dL (ref 0.6–1.1)
Calcium: 9.4 mg/dL (ref 8.4–10.4)
Chloride: 103 mEq/L (ref 98–109)
Glucose: 178 mg/dl — ABNORMAL HIGH (ref 70–140)
Potassium: 3.7 mEq/L (ref 3.5–5.1)
Sodium: 140 mEq/L (ref 136–145)
TOTAL PROTEIN: 6.3 g/dL — AB (ref 6.4–8.3)

## 2015-05-18 MED ORDER — SODIUM CHLORIDE 0.9 % IV SOLN
Freq: Once | INTRAVENOUS | Status: AC
  Administered 2015-05-18: 12:00:00 via INTRAVENOUS

## 2015-05-18 MED ORDER — HEPARIN SOD (PORK) LOCK FLUSH 100 UNIT/ML IV SOLN
500.0000 [IU] | Freq: Once | INTRAVENOUS | Status: AC | PRN
  Administered 2015-05-18: 500 [IU]
  Filled 2015-05-18: qty 5

## 2015-05-18 MED ORDER — SODIUM CHLORIDE 0.9 % IJ SOLN
10.0000 mL | INTRAMUSCULAR | Status: DC | PRN
  Administered 2015-05-18: 10 mL
  Filled 2015-05-18: qty 10

## 2015-05-18 MED ORDER — SODIUM CHLORIDE 0.9 % IV SOLN
Freq: Once | INTRAVENOUS | Status: AC
  Administered 2015-05-18: 13:00:00 via INTRAVENOUS
  Filled 2015-05-18: qty 8

## 2015-05-18 MED ORDER — DIPHENHYDRAMINE HCL 50 MG/ML IJ SOLN
INTRAMUSCULAR | Status: AC
  Filled 2015-05-18: qty 1

## 2015-05-18 MED ORDER — SODIUM CHLORIDE 0.9 % IV SOLN
137.7000 mg | Freq: Once | INTRAVENOUS | Status: AC
  Administered 2015-05-18: 140 mg via INTRAVENOUS
  Filled 2015-05-18: qty 14

## 2015-05-18 MED ORDER — DIPHENHYDRAMINE HCL 50 MG/ML IJ SOLN
50.0000 mg | Freq: Once | INTRAMUSCULAR | Status: AC
  Administered 2015-05-18: 50 mg via INTRAVENOUS

## 2015-05-18 MED ORDER — FAMOTIDINE IN NACL 20-0.9 MG/50ML-% IV SOLN
20.0000 mg | Freq: Once | INTRAVENOUS | Status: AC
  Administered 2015-05-18: 20 mg via INTRAVENOUS

## 2015-05-18 MED ORDER — FAMOTIDINE IN NACL 20-0.9 MG/50ML-% IV SOLN
INTRAVENOUS | Status: AC
  Filled 2015-05-18: qty 50

## 2015-05-18 MED ORDER — PACLITAXEL CHEMO INJECTION 300 MG/50ML
65.0000 mg/m2 | Freq: Once | INTRAVENOUS | Status: AC
  Administered 2015-05-18: 120 mg via INTRAVENOUS
  Filled 2015-05-18: qty 20

## 2015-05-18 NOTE — Telephone Encounter (Signed)
Appointments made and avs printed for patient °

## 2015-05-18 NOTE — Progress Notes (Signed)
Patient Care Team: Antony Blackbird, MD as PCP - General (Family Medicine)  DIAGNOSIS: Breast cancer of upper-inner quadrant of left female breast   Staging form: Breast, AJCC 7th Edition     Clinical: Stage IIIB (T4b, N0, M0) - Signed by Rulon Eisenmenger, MD on 01/17/2015   SUMMARY OF ONCOLOGIC HISTORY:   Breast cancer of upper-inner quadrant of left female breast   12/27/2014 Initial Diagnosis Grade 3 invasive mammary cancer, one lymph node negative in left axilla, ER 0%, PR 0%, Ki-67 98%, HER-2 negative ratio 1.38   01/06/2015 Breast MRI Left breast large enhancing mass, evidence of necrosis, extending from posterior through the middle third of left breast, extending from chest wall to the dermis, close to pectoralis fascia extending 4.5 cm, several bilateral axillary lymph nodes   01/17/2015 -  Neo-Adjuvant Chemotherapy Neoadjuvant chemotherapy with dose dense Adriamycin and Cytoxan followed by Taxol and carboplatin   01/24/2015 - 01/27/2015 Hospital Admission Neutropenic fever admission, no source identified    CHIEF COMPLIANT: Week 10/12 Taxol and carboplatin INTERVAL HISTORY: Patty Buckley is a 64 year old with above-mentioned history of left breast cancer with extension into the dermis all the way to the chest currently on neoadjuvant chemotherapy. She is currently on Taxol and carboplatin. She tolerates this drug remarkably well with few complaints. She denies fevers, chills, nausea, or vomiting. She moves her bowel swell. Her appetite is fair secondary to taste changes. She has some fatigue halfway through the week, but bounces back well. She had a lipid panel with her PCP and had elevated triglyceride and cholesterol levels. The PCP would like to start her on statin therapy, but the patient is hesitant about this while on chemotherapy.  REVIEW OF SYSTEMS:   Constitutional: Denies fevers, chills or abnormal weight loss Eyes: Denies blurriness of vision Ears, nose, mouth, throat, and face:  Denies mucositis or sore throat Respiratory: Denies cough, dyspnea or wheezes Cardiovascular: Denies palpitation, chest discomfort or lower extremity swelling Gastrointestinal:  Denies nausea, heartburn or change in bowel habits Skin: Denies abnormal skin rashes Lymphatics: Denies new lymphadenopathy or easy bruising Neurological:Denies numbness, tingling or new weaknesses Behavioral/Psych: Mood is stable, no new changes  Breast:  Breast lump is smaller All other systems were reviewed with the patient and are negative.  I have reviewed the past medical history, past surgical history, social history and family history with the patient and they are unchanged from previous note.  ALLERGIES:  is allergic to codeine.  MEDICATIONS:  Current Outpatient Prescriptions  Medication Sig Dispense Refill  . aspirin 81 MG tablet Take 81 mg by mouth daily.      . metoprolol (LOPRESSOR) 50 MG tablet Take 1 tablet (50 mg total) by mouth 2 (two) times daily. (Patient taking differently: Take 50 mg by mouth daily. ) 180 tablet 3  . Multiple Vitamin (MULTIVITAMIN) tablet Take 1 tablet by mouth daily.     . ondansetron (ZOFRAN) 8 MG tablet Take 1 tablet (8 mg total) by mouth 2 (two) times daily as needed. Start on the third day after chemotherapy. 30 tablet 1  . HYDROcodone-acetaminophen (NORCO/VICODIN) 5-325 MG per tablet Take 1-2 tablets by mouth every 4 (four) hours as needed for moderate pain. (Patient not taking: Reported on 05/18/2015) 30 tablet 0  . lisinopril (PRINIVIL,ZESTRIL) 10 MG tablet Take 1 tablet (10 mg total) by mouth daily. Take 10 mg by mouth every morning. (Patient not taking: Reported on 05/18/2015) 30 tablet 3  . LORazepam (ATIVAN) 0.5 MG tablet Take  1 tablet (0.5 mg total) by mouth every 6 (six) hours as needed (Nausea or vomiting). (Patient not taking: Reported on 05/18/2015) 30 tablet 0   No current facility-administered medications for this visit.   Facility-Administered Medications Ordered  in Other Visits  Medication Dose Route Frequency Provider Last Rate Last Dose  . sodium chloride 0.9 % injection 10 mL  10 mL Intracatheter PRN Nicholas Lose, MD   10 mL at 05/18/15 1451    PHYSICAL EXAMINATION: ECOG PERFORMANCE STATUS: 1 - Symptomatic but completely ambulatory  Filed Vitals:   05/18/15 1026  BP: 138/74  Pulse: 99  Temp: 98.1 F (36.7 C)  Resp: 18   Filed Weights   05/18/15 1026  Weight: 161 lb 1.6 oz (73.074 kg)    GENERAL:alert, no distress and comfortable SKIN: skin color, texture, turgor are normal, no rashes or significant lesions EYES: normal, Conjunctiva are pink and non-injected, sclera clear OROPHARYNX:no exudate, no erythema and lips, buccal mucosa, and tongue normal  NECK: supple, thyroid normal size, non-tender, without nodularity LYMPH:  no palpable lymphadenopathy in the cervical, axillary or inguinal LUNGS: clear to auscultation and percussion with normal breathing effort HEART: regular rate & rhythm and no murmurs and no lower extremity edema ABDOMEN:abdomen soft, non-tender and normal bowel sounds Musculoskeletal:no cyanosis of digits and no clubbing  NEURO: alert & oriented x 3 with fluent speech, no focal motor/sensory deficits   LABORATORY DATA:  I have reviewed the data as listed   Chemistry      Component Value Date/Time   NA 140 05/18/2015 1015   NA 140 02/13/2015 1130   K 3.7 05/18/2015 1015   K 4.3 02/13/2015 1130   CL 107 02/13/2015 1130   CO2 27 05/18/2015 1015   CO2 25 02/13/2015 1130   BUN 12.0 05/18/2015 1015   BUN 8 02/13/2015 1130   CREATININE 0.7 05/18/2015 1015   CREATININE 0.74 02/13/2015 1130      Component Value Date/Time   CALCIUM 9.4 05/18/2015 1015   CALCIUM 9.0 02/13/2015 1130   ALKPHOS 73 05/18/2015 1015   ALKPHOS 53 01/25/2015 0500   AST 23 05/18/2015 1015   AST 12 01/25/2015 0500   ALT 19 05/18/2015 1015   ALT 12 01/25/2015 0500   BILITOT 0.59 05/18/2015 1015   BILITOT 0.5 01/25/2015 0500        Lab Results  Component Value Date   WBC 4.0 05/18/2015   HGB 11.4* 05/18/2015   HCT 33.8* 05/18/2015   MCV 110.5* 05/18/2015   PLT 242 05/18/2015   NEUTROABS 2.9 05/18/2015    ASSESSMENT & PLAN:  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 Neutropenic fever admission after cycle 1. I debated whether or not we need to dose reduce, at this point I have elected to keep the dosage of chemotherapy the same for cycle 2. 2. Severe fatigue the week after chemotherapy 3. Denies any nausea vomiting mucositis or any other concerns 4. Elevated AST after fourth Taxol and carbo normalized by cycle 5 5. Anemia due to chemotherapy: hgb up to 11.4  Patient will complete this regimen in 2 weeks.  I have placed orders for a final breast MRI as well as a consult to Dr. Georgette Dover for surgery. She will meet with Dr. Lindi Adie in a few weeks to discuss the results of the scan.  She is going to restart the 12m lisinopril because she has concerns about her blood pressure creeping back up. She will continue the lower dose of metoprolol as advised by Dr. GLindi Adie   Orders Placed This Encounter  Procedures  . MR Breast Bilateral W Contrast    Standing Status: Future     Number of Occurrences:      Standing Expiration Date: 07/17/2016    Order Specific Question:  Reason for Exam (SYMPTOM  OR DIAGNOSIS REQUIRED)    Answer:  left breast cancer. chemo completed    Order Specific Question:  Preferred imaging location?    [facility]   Order Specific  Question:  Does the patient have a pacemaker or implanted devices?    Answer:  No    Order Specific Question:  What is the patient's sedation requirement?    Answer:  No Sedation  . Ambulatory referral to General Surgery    Referral Priority:  Routine    Referral Type:  Surgical    Referral Reason:  Specialty Services Required    Requested Specialty:  General Surgery    Number of Visits Requested:  1   The patient has a good understanding of the overall plan. she agrees with it. she will call with any problems that may develop before the next visit here.   HLaurie Panda NP

## 2015-05-18 NOTE — Patient Instructions (Signed)
[facility] Discharge Instructions for Patients Receiving Chemotherapy  Today you received the following chemotherapy agents: Taxol and Carboplatin.  To help prevent nausea and vomiting after your treatment, we encourage you to take your nausea medication: Zofran. Take one every 8 hours as needed.   If you develop nausea and vomiting that is not controlled by your nausea medication, call the clinic.   BELOW ARE SYMPTOMS THAT SHOULD BE REPORTED IMMEDIATELY:  *FEVER GREATER THAN 100.5 F  *CHILLS WITH OR WITHOUT FEVER  NAUSEA AND VOMITING THAT IS NOT CONTROLLED WITH YOUR NAUSEA MEDICATION  *UNUSUAL SHORTNESS OF BREATH  *UNUSUAL BRUISING OR BLEEDING  TENDERNESS IN MOUTH AND THROAT WITH OR WITHOUT PRESENCE OF ULCERS  *URINARY PROBLEMS  *BOWEL PROBLEMS  UNUSUAL RASH Items with * indicate a potential emergency and should be followed up as soon as possible.  Feel free to call the clinic should you have any questions or concerns. The clinic phone number is [phone].  Please show the [facility] at check-in to the Emergency Department and triage nurse.

## 2015-05-18 NOTE — Telephone Encounter (Signed)
avs pritned for patient,patient to see dr Georgette Dover 06/14/15 2;10

## 2015-05-22 ENCOUNTER — Telehealth: Payer: Self-pay

## 2015-05-22 NOTE — Telephone Encounter (Signed)
Lab results dtd 05/15/15 rcvd from [facility] at [facility].  Reviewed by Dr. Lindi Adie, Sent to scan.

## 2015-05-24 ENCOUNTER — Encounter: Payer: Self-pay | Admitting: Hematology and Oncology

## 2015-05-25 ENCOUNTER — Telehealth: Payer: Self-pay | Admitting: Hematology and Oncology

## 2015-05-25 ENCOUNTER — Other Ambulatory Visit (HOSPITAL_BASED_OUTPATIENT_CLINIC_OR_DEPARTMENT_OTHER): Payer: Medicare Other

## 2015-05-25 ENCOUNTER — Ambulatory Visit (HOSPITAL_BASED_OUTPATIENT_CLINIC_OR_DEPARTMENT_OTHER): Payer: Medicare Other

## 2015-05-25 ENCOUNTER — Encounter: Payer: Self-pay | Admitting: *Deleted

## 2015-05-25 ENCOUNTER — Other Ambulatory Visit: Payer: Self-pay | Admitting: Nurse Practitioner

## 2015-05-25 VITALS — BP 124/76 | HR 105 | Temp 98.1°F | Resp 18

## 2015-05-25 DIAGNOSIS — C50212 Malignant neoplasm of upper-inner quadrant of left female breast: Secondary | ICD-10-CM | POA: Diagnosis present

## 2015-05-25 DIAGNOSIS — D701 Agranulocytosis secondary to cancer chemotherapy: Secondary | ICD-10-CM

## 2015-05-25 DIAGNOSIS — Z5111 Encounter for antineoplastic chemotherapy: Secondary | ICD-10-CM

## 2015-05-25 LAB — CBC WITH DIFFERENTIAL/PLATELET
BASO%: 0.4 % (ref 0.0–2.0)
Basophils Absolute: 0 10*3/uL (ref 0.0–0.1)
EOS%: 0.8 % (ref 0.0–7.0)
Eosinophils Absolute: 0 10*3/uL (ref 0.0–0.5)
HCT: 35.5 % (ref 34.8–46.6)
HGB: 11.8 g/dL (ref 11.6–15.9)
LYMPH%: 20.4 % (ref 14.0–49.7)
MCH: 36.4 pg — ABNORMAL HIGH (ref 25.1–34.0)
MCHC: 33.2 g/dL (ref 31.5–36.0)
MCV: 109.6 fL — ABNORMAL HIGH (ref 79.5–101.0)
MONO#: 0.4 10*3/uL (ref 0.1–0.9)
MONO%: 8.2 % (ref 0.0–14.0)
NEUT#: 3.4 10*3/uL (ref 1.5–6.5)
NEUT%: 70.2 % (ref 38.4–76.8)
Platelets: 252 10*3/uL (ref 145–400)
RBC: 3.24 10*6/uL — AB (ref 3.70–5.45)
RDW: 16.9 % — ABNORMAL HIGH (ref 11.2–14.5)
WBC: 4.9 10*3/uL (ref 3.9–10.3)
lymph#: 1 10*3/uL (ref 0.9–3.3)

## 2015-05-25 LAB — COMPREHENSIVE METABOLIC PANEL (CC13)
ALK PHOS: 68 U/L (ref 40–150)
ALT: 28 U/L (ref 0–55)
AST: 23 U/L (ref 5–34)
Albumin: 3.5 g/dL (ref 3.5–5.0)
Anion Gap: 8 mEq/L (ref 3–11)
BUN: 12.1 mg/dL (ref 7.0–26.0)
CHLORIDE: 106 meq/L (ref 98–109)
CO2: 25 mEq/L (ref 22–29)
CREATININE: 0.7 mg/dL (ref 0.6–1.1)
Calcium: 9.4 mg/dL (ref 8.4–10.4)
EGFR: >90 mL/min/{1.73_m2} (ref >90)
Glucose: 135 mg/dl (ref 70–140)
POTASSIUM: 4.1 meq/L (ref 3.5–5.1)
SODIUM: 138 meq/L (ref 136–145)
Total Bilirubin: 0.59 mg/dL (ref 0.20–1.20)
Total Protein: 6.4 g/dL (ref 6.4–8.3)

## 2015-05-25 MED ORDER — SODIUM CHLORIDE 0.9 % IJ SOLN
10.0000 mL | INTRAMUSCULAR | Status: DC | PRN
Start: 2015-05-25 — End: 2015-05-25
  Administered 2015-05-25: 10 mL
  Filled 2015-05-25: qty 10

## 2015-05-25 MED ORDER — DIPHENHYDRAMINE HCL 50 MG/ML IJ SOLN
INTRAMUSCULAR | Status: AC
  Filled 2015-05-25: qty 1

## 2015-05-25 MED ORDER — SODIUM CHLORIDE 0.9 % IV SOLN
137.7000 mg | Freq: Once | INTRAVENOUS | Status: AC
  Administered 2015-05-25: 140 mg via INTRAVENOUS
  Filled 2015-05-25: qty 14

## 2015-05-25 MED ORDER — SODIUM CHLORIDE 0.9 % IV SOLN
Freq: Once | INTRAVENOUS | Status: AC
  Administered 2015-05-25: 12:00:00 via INTRAVENOUS
  Filled 2015-05-25: qty 8

## 2015-05-25 MED ORDER — FAMOTIDINE IN NACL 20-0.9 MG/50ML-% IV SOLN
INTRAVENOUS | Status: AC
  Filled 2015-05-25: qty 50

## 2015-05-25 MED ORDER — DIPHENHYDRAMINE HCL 50 MG/ML IJ SOLN
50.0000 mg | Freq: Once | INTRAMUSCULAR | Status: AC
  Administered 2015-05-25: 50 mg via INTRAVENOUS

## 2015-05-25 MED ORDER — FAMOTIDINE IN NACL 20-0.9 MG/50ML-% IV SOLN
20.0000 mg | Freq: Once | INTRAVENOUS | Status: AC
  Administered 2015-05-25: 20 mg via INTRAVENOUS

## 2015-05-25 MED ORDER — HEPARIN SOD (PORK) LOCK FLUSH 100 UNIT/ML IV SOLN
500.0000 [IU] | Freq: Once | INTRAVENOUS | Status: AC | PRN
  Administered 2015-05-25: 500 [IU]
  Filled 2015-05-25: qty 5

## 2015-05-25 MED ORDER — PACLITAXEL CHEMO INJECTION 300 MG/50ML
65.0000 mg/m2 | Freq: Once | INTRAVENOUS | Status: AC
  Administered 2015-05-25: 120 mg via INTRAVENOUS
  Filled 2015-05-25: qty 20

## 2015-05-25 MED ORDER — SODIUM CHLORIDE 0.9 % IV SOLN
Freq: Once | INTRAVENOUS | Status: AC
  Administered 2015-05-25: 12:00:00 via INTRAVENOUS

## 2015-05-25 NOTE — Progress Notes (Signed)
Patient reports she can visually see her tumor growing over the past 3 to 4 days. Patient states the tumor has become slightly painful intermittently, rating the pain a 3 on the 0 to 10 pain scale. Patient denies pain today. Patient stated she sent Dr. Lindi Adie a message via my chart. Patient was informed Dr. Lindi Adie is out of the office this week. Gentry Fitz, NP notified of current situation and patient concerns. Gentry Fitz, NP stated she will contact Dr. Awanda Mink office and request an appointment and she will inform Dr. Lindi Adie of the concerns. Patient verbalized understanding of the above plan. Patient verbalized understanding she is to contact our office with any further concerns or questions.

## 2015-05-25 NOTE — Patient Instructions (Signed)
[facility] Discharge Instructions for Patients Receiving Chemotherapy  Today you received the following chemotherapy agents: Taxol, Carboplatin  To help prevent nausea and vomiting after your treatment, we encourage you to take your nausea medication as prescribed by your physician.   If you develop nausea and vomiting that is not controlled by your nausea medication, call the clinic.   BELOW ARE SYMPTOMS THAT SHOULD BE REPORTED IMMEDIATELY:  *FEVER GREATER THAN 100.5 F  *CHILLS WITH OR WITHOUT FEVER  NAUSEA AND VOMITING THAT IS NOT CONTROLLED WITH YOUR NAUSEA MEDICATION  *UNUSUAL SHORTNESS OF BREATH  *UNUSUAL BRUISING OR BLEEDING  TENDERNESS IN MOUTH AND THROAT WITH OR WITHOUT PRESENCE OF ULCERS  *URINARY PROBLEMS  *BOWEL PROBLEMS  UNUSUAL RASH Items with * indicate a potential emergency and should be followed up as soon as possible.  Feel free to call the clinic you have any questions or concerns. The clinic phone number is [phone].  Please show the CHEMO ALERT CARD at check-in to the Emergency Department and triage nurse.   

## 2015-05-25 NOTE — Telephone Encounter (Signed)
06/14/15 2:10,pt has an appointment that was made on 6/9 for patient,called and left a message with the appointment

## 2015-05-26 ENCOUNTER — Encounter: Payer: Self-pay | Admitting: Nurse Practitioner

## 2015-05-26 NOTE — Telephone Encounter (Signed)
Called pt at 1715 today.  I let the patient know that I would let Dr. Lindi Adie know of her concerns when he gets back in on Monday.  Pt voiced understanding.

## 2015-05-26 NOTE — Progress Notes (Unsigned)
Late entry- 05/25/15 Oncology Nurse Navigator Documentation  Oncology Nurse Navigator Flowsheets 05/26/2015  Navigator Encounter Type Treatment  Patient Visit Type Medonc  Treatment Phase Treatment  Barriers/Navigation Needs No barriers at this time  Time Spent with Patient 15

## 2015-05-30 ENCOUNTER — Encounter: Payer: Self-pay | Admitting: *Deleted

## 2015-05-30 NOTE — Progress Notes (Signed)
Received lab results from [facility], sent to scan.

## 2015-05-31 ENCOUNTER — Other Ambulatory Visit: Payer: Self-pay | Admitting: Hematology and Oncology

## 2015-05-31 ENCOUNTER — Other Ambulatory Visit: Payer: Self-pay | Admitting: *Deleted

## 2015-05-31 DIAGNOSIS — C50212 Malignant neoplasm of upper-inner quadrant of left female breast: Secondary | ICD-10-CM

## 2015-05-31 MED ORDER — HYDROCODONE-ACETAMINOPHEN 5-325 MG PO TABS: 1.0000 | ORAL_TABLET | ORAL | Status: DC | PRN

## 2015-06-01 ENCOUNTER — Ambulatory Visit (HOSPITAL_BASED_OUTPATIENT_CLINIC_OR_DEPARTMENT_OTHER): Payer: Medicare Other

## 2015-06-01 ENCOUNTER — Other Ambulatory Visit (HOSPITAL_BASED_OUTPATIENT_CLINIC_OR_DEPARTMENT_OTHER): Payer: Medicare Other

## 2015-06-01 VITALS — BP 121/82 | HR 100 | Temp 98.0°F | Resp 18

## 2015-06-01 DIAGNOSIS — Z5111 Encounter for antineoplastic chemotherapy: Secondary | ICD-10-CM | POA: Diagnosis present

## 2015-06-01 DIAGNOSIS — C50212 Malignant neoplasm of upper-inner quadrant of left female breast: Secondary | ICD-10-CM

## 2015-06-01 LAB — CBC WITH DIFFERENTIAL/PLATELET
BASO%: 0.3 % (ref 0.0–2.0)
Basophils Absolute: 0 10*3/uL (ref 0.0–0.1)
EOS%: 0.6 % (ref 0.0–7.0)
Eosinophils Absolute: 0 10*3/uL (ref 0.0–0.5)
HEMATOCRIT: 34.8 % (ref 34.8–46.6)
HGB: 11.7 g/dL (ref 11.6–15.9)
LYMPH%: 16.3 % (ref 14.0–49.7)
MCH: 37.1 pg — AB (ref 25.1–34.0)
MCHC: 33.6 g/dL (ref 31.5–36.0)
MCV: 110.5 fL — ABNORMAL HIGH (ref 79.5–101.0)
MONO#: 0.5 10*3/uL (ref 0.1–0.9)
MONO%: 8.1 % (ref 0.0–14.0)
NEUT#: 4.8 10*3/uL (ref 1.5–6.5)
NEUT%: 74.7 % (ref 38.4–76.8)
PLATELETS: 246 10*3/uL (ref 145–400)
RBC: 3.15 10*6/uL — AB (ref 3.70–5.45)
RDW: 16.7 % — ABNORMAL HIGH (ref 11.2–14.5)
WBC: 6.4 10*3/uL (ref 3.9–10.3)
lymph#: 1.1 10*3/uL (ref 0.9–3.3)

## 2015-06-01 LAB — COMPREHENSIVE METABOLIC PANEL (CC13)
ALT: 19 U/L (ref 0–55)
ANION GAP: 8 meq/L (ref 3–11)
AST: 19 U/L (ref 5–34)
Albumin: 3.4 g/dL — ABNORMAL LOW (ref 3.5–5.0)
Alkaline Phosphatase: 69 U/L (ref 40–150)
BUN: 10.3 mg/dL (ref 7.0–26.0)
CHLORIDE: 104 meq/L (ref 98–109)
CO2: 25 meq/L (ref 22–29)
CREATININE: 0.7 mg/dL (ref 0.6–1.1)
Calcium: 9.7 mg/dL (ref 8.4–10.4)
EGFR: >90 mL/min/{1.73_m2} (ref >90)
Glucose: 128 mg/dl (ref 70–140)
POTASSIUM: 4.3 meq/L (ref 3.5–5.1)
Sodium: 137 mEq/L (ref 136–145)
Total Bilirubin: 0.5 mg/dL (ref 0.20–1.20)
Total Protein: 6.3 g/dL — ABNORMAL LOW (ref 6.4–8.3)

## 2015-06-01 MED ORDER — SODIUM CHLORIDE 0.9 % IJ SOLN
10.0000 mL | INTRAMUSCULAR | Status: DC | PRN
  Administered 2015-06-01: 10 mL
  Filled 2015-06-01: qty 10

## 2015-06-01 MED ORDER — FAMOTIDINE IN NACL 20-0.9 MG/50ML-% IV SOLN
20.0000 mg | Freq: Once | INTRAVENOUS | Status: AC
  Administered 2015-06-01: 20 mg via INTRAVENOUS

## 2015-06-01 MED ORDER — ACETAMINOPHEN 325 MG PO TABS: 650.0000 mg | ORAL_TABLET | Freq: Once | ORAL | Status: DC

## 2015-06-01 MED ORDER — HEPARIN SOD (PORK) LOCK FLUSH 100 UNIT/ML IV SOLN
500.0000 [IU] | Freq: Once | INTRAVENOUS | Status: AC | PRN
  Administered 2015-06-01: 500 [IU]
  Filled 2015-06-01: qty 5

## 2015-06-01 MED ORDER — SODIUM CHLORIDE 0.9 % IV SOLN
Freq: Once | INTRAVENOUS | Status: AC
  Administered 2015-06-01: 13:00:00 via INTRAVENOUS
  Filled 2015-06-01: qty 8

## 2015-06-01 MED ORDER — DIPHENHYDRAMINE HCL 50 MG/ML IJ SOLN
50.0000 mg | Freq: Once | INTRAMUSCULAR | Status: AC
  Administered 2015-06-01: 50 mg via INTRAVENOUS

## 2015-06-01 MED ORDER — SODIUM CHLORIDE 0.9 % IV SOLN
Freq: Once | INTRAVENOUS | Status: AC
  Administered 2015-06-01: 12:00:00 via INTRAVENOUS

## 2015-06-01 MED ORDER — PACLITAXEL CHEMO INJECTION 300 MG/50ML
65.0000 mg/m2 | Freq: Once | INTRAVENOUS | Status: AC
  Administered 2015-06-01: 120 mg via INTRAVENOUS
  Filled 2015-06-01: qty 20

## 2015-06-01 MED ORDER — FAMOTIDINE IN NACL 20-0.9 MG/50ML-% IV SOLN
INTRAVENOUS | Status: AC
  Filled 2015-06-01: qty 50

## 2015-06-01 MED ORDER — SODIUM CHLORIDE 0.9 % IV SOLN
137.7000 mg | Freq: Once | INTRAVENOUS | Status: AC
  Administered 2015-06-01: 140 mg via INTRAVENOUS
  Filled 2015-06-01: qty 14

## 2015-06-01 MED ORDER — DIPHENHYDRAMINE HCL 50 MG/ML IJ SOLN
INTRAMUSCULAR | Status: AC
  Filled 2015-06-01: qty 1

## 2015-06-01 NOTE — Patient Instructions (Signed)
[facility] Discharge Instructions for Patients Receiving Chemotherapy  Today you received the following chemotherapy agents Taxol and Carboplatin.  To help prevent nausea and vomiting after your treatment, we encourage you to take your nausea medication as prescribed.   If you develop nausea and vomiting that is not controlled by your nausea medication, call the clinic.   BELOW ARE SYMPTOMS THAT SHOULD BE REPORTED IMMEDIATELY:  *FEVER GREATER THAN 100.5 F  *CHILLS WITH OR WITHOUT FEVER  NAUSEA AND VOMITING THAT IS NOT CONTROLLED WITH YOUR NAUSEA MEDICATION  *UNUSUAL SHORTNESS OF BREATH  *UNUSUAL BRUISING OR BLEEDING  TENDERNESS IN MOUTH AND THROAT WITH OR WITHOUT PRESENCE OF ULCERS  *URINARY PROBLEMS  *BOWEL PROBLEMS  UNUSUAL RASH Items with * indicate a potential emergency and should be followed up as soon as possible.  Feel free to call the clinic you have any questions or concerns. The clinic phone number is [phone].  Please show the CHEMO ALERT CARD at check-in to the Emergency Department and triage nurse.   

## 2015-06-02 ENCOUNTER — Telehealth: Payer: Self-pay | Admitting: *Deleted

## 2015-06-02 ENCOUNTER — Other Ambulatory Visit: Payer: Self-pay | Admitting: Hematology and Oncology

## 2015-06-02 DIAGNOSIS — C50212 Malignant neoplasm of upper-inner quadrant of left female breast: Secondary | ICD-10-CM

## 2015-06-02 MED ORDER — AMOXICILLIN-POT CLAVULANATE 875-125 MG PO TABS: 1.0000 | ORAL_TABLET | Freq: Two times a day (BID) | ORAL | Status: DC

## 2015-06-02 NOTE — Telephone Encounter (Signed)
Returned call to patient's sister Lelon Frohlich). Pt's sister call out of concern for pt. Pt feels like tumor is increasing in size despite chemo tx's. Pt received last chemo yesterday and will have a MRI done on June 08, 2015 and will have a visit with Dr. Lindi Adie on June 14, 2015. Pt's sister wanted to know if these dates could be moved up earlier. I told her the appt will stay as scheduled simply b/c this is the earliest it could be scheduled. I also told pt's sister that the last mammogram pt had done did show the tumor had decreased in size and the pt's sister agreed with that; however, more recently in  the past 12 - 14 days pt feels like the tumor is growing and she can visually see it changing as well as feel(touch) it changing. Described as more red to purple in color and edges of tumor more uneven. Rated pain as a "low level achiness," mostly at night and sometimes pain increases with movement. Pt does take pain medication at night. I did call pt as well but was unable to reach her , but left a message she can call us back if she has any questions.

## 2015-06-05 ENCOUNTER — Encounter: Payer: Self-pay | Admitting: Physician Assistant

## 2015-06-05 ENCOUNTER — Other Ambulatory Visit: Payer: Self-pay

## 2015-06-08 ENCOUNTER — Telehealth: Payer: Self-pay | Admitting: *Deleted

## 2015-06-08 ENCOUNTER — Ambulatory Visit (HOSPITAL_COMMUNITY)
Admission: RE | Admit: 2015-06-08 | Discharge: 2015-06-08 | Disposition: A | Discharge status: Home / Self Care | Payer: Medicare Other | Source: Ambulatory Visit | Attending: Nurse Practitioner | Admitting: Nurse Practitioner

## 2015-06-08 DIAGNOSIS — C50212 Malignant neoplasm of upper-inner quadrant of left female breast: Secondary | ICD-10-CM

## 2015-06-08 DIAGNOSIS — C50912 Malignant neoplasm of unspecified site of left female breast: Secondary | ICD-10-CM | POA: Diagnosis present

## 2015-06-08 DIAGNOSIS — R590 Localized enlarged lymph nodes: Secondary | ICD-10-CM | POA: Insufficient documentation

## 2015-06-08 MED ORDER — GADOBENATE DIMEGLUMINE 529 MG/ML IV SOLN
15.0000 mL | Freq: Once | INTRAVENOUS | Status: AC | PRN
Start: 2015-06-08 — End: 2015-06-08
  Administered 2015-06-08: 15 mL via INTRAVENOUS

## 2015-06-08 NOTE — Telephone Encounter (Signed)
Patient called and left message that she has completed the MRI and technician stated results should be available to Dr. Lindi Adie this afternoon.  Patient also wanted Dr. Lindi Adie to know that she has one more day of the antibiotic.  "It has not been effective at all, the breast is still large, painful and draining."  Her call back is [phone].

## 2015-06-08 NOTE — Telephone Encounter (Signed)
Sister, Darcus Austin is calling.  She is very upset that neither Dr. Lindi Adie or his NP has actually looked at breast tumor.  "Chemo nurse" reported that patient stated it was "growing" and antibiotic was prescribed.  Sister said that antibiotic is not doing any good and she would like someone to actually look at the tumor in her breast - either Dr. Lindi Adie or his NP.  Patient is due to have an MRI today and then see Dr. Lindi Adie on 06/14/15.  Lelon Frohlich states that this is a long time to wait.  She realizes that the MRI will reveal more than a physical exam, but she would still like someone to see her sister, Patty Buckley ASAP.  Talena's call back is [phone].

## 2015-06-13 NOTE — Assessment & Plan Note (Signed)
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 Mastectomy 2. Followed by XRT  RTC after surgery

## 2015-06-14 ENCOUNTER — Other Ambulatory Visit (HOSPITAL_BASED_OUTPATIENT_CLINIC_OR_DEPARTMENT_OTHER): Payer: Medicare Other

## 2015-06-14 ENCOUNTER — Ambulatory Visit: Payer: Self-pay | Admitting: Surgery

## 2015-06-14 ENCOUNTER — Ambulatory Visit (HOSPITAL_BASED_OUTPATIENT_CLINIC_OR_DEPARTMENT_OTHER): Payer: Medicare Other | Admitting: Hematology and Oncology

## 2015-06-14 ENCOUNTER — Encounter: Payer: Self-pay | Admitting: Hematology and Oncology

## 2015-06-14 VITALS — BP 141/69 | HR 113 | Temp 98.1°F | Resp 18 | Ht 63.0 in | Wt 159.0 lb

## 2015-06-14 DIAGNOSIS — C773 Secondary and unspecified malignant neoplasm of axilla and upper limb lymph nodes: Secondary | ICD-10-CM | POA: Diagnosis not present

## 2015-06-14 DIAGNOSIS — Z171 Estrogen receptor negative status [ER-]: Secondary | ICD-10-CM

## 2015-06-14 DIAGNOSIS — C50212 Malignant neoplasm of upper-inner quadrant of left female breast: Secondary | ICD-10-CM | POA: Diagnosis present

## 2015-06-14 DIAGNOSIS — D6481 Anemia due to antineoplastic chemotherapy: Secondary | ICD-10-CM | POA: Diagnosis not present

## 2015-06-14 LAB — CBC WITH DIFFERENTIAL/PLATELET
BASO%: 0.5 % (ref 0.0–2.0)
BASOS ABS: 0 10*3/uL (ref 0.0–0.1)
EOS%: 0.3 % (ref 0.0–7.0)
Eosinophils Absolute: 0 10*3/uL (ref 0.0–0.5)
HCT: 39 % (ref 34.8–46.6)
HEMOGLOBIN: 13 g/dL (ref 11.6–15.9)
LYMPH#: 0.4 10*3/uL — AB (ref 0.9–3.3)
LYMPH%: 9.3 % — AB (ref 14.0–49.7)
MCH: 37 pg — ABNORMAL HIGH (ref 25.1–34.0)
MCHC: 33.4 g/dL (ref 31.5–36.0)
MCV: 110.7 fL — ABNORMAL HIGH (ref 79.5–101.0)
MONO#: 0.4 10*3/uL (ref 0.1–0.9)
MONO%: 8.8 % (ref 0.0–14.0)
NEUT#: 3.8 10*3/uL (ref 1.5–6.5)
NEUT%: 81.1 % — AB (ref 38.4–76.8)
Platelets: 261 10*3/uL (ref 145–400)
RBC: 3.52 10*6/uL — ABNORMAL LOW (ref 3.70–5.45)
RDW: 17 % — AB (ref 11.2–14.5)
WBC: 4.7 10*3/uL (ref 3.9–10.3)

## 2015-06-14 LAB — COMPREHENSIVE METABOLIC PANEL (CC13)
ALK PHOS: 73 U/L (ref 40–150)
ALT: 24 U/L (ref 0–55)
AST: 29 U/L (ref 5–34)
Albumin: 3.3 g/dL — ABNORMAL LOW (ref 3.5–5.0)
Anion Gap: 13 mEq/L — ABNORMAL HIGH (ref 3–11)
BILIRUBIN TOTAL: 0.67 mg/dL (ref 0.20–1.20)
BUN: 12.4 mg/dL (ref 7.0–26.0)
CO2: 24 meq/L (ref 22–29)
Calcium: 10 mg/dL (ref 8.4–10.4)
Chloride: 102 mEq/L (ref 98–109)
Creatinine: 1.2 mg/dL — ABNORMAL HIGH (ref 0.6–1.1)
EGFR: 50 mL/min/{1.73_m2} — ABNORMAL LOW (ref >90)
Glucose: 191 mg/dl — ABNORMAL HIGH (ref 70–140)
Potassium: 4.4 mEq/L (ref 3.5–5.1)
SODIUM: 139 meq/L (ref 136–145)
TOTAL PROTEIN: 6.8 g/dL (ref 6.4–8.3)

## 2015-06-14 NOTE — Progress Notes (Signed)
Patient Care Team: Antony Blackbird, MD as PCP - General (Family Medicine)  DIAGNOSIS: Breast cancer of upper-inner quadrant of left female breast   Staging form: Breast, AJCC 7th Edition     Clinical: Stage IIIB (T4b, N0, M0) - Signed by Rulon Eisenmenger, MD on 01/17/2015   SUMMARY OF ONCOLOGIC HISTORY:   Breast cancer of upper-inner quadrant of left female breast   12/27/2014 Initial Diagnosis Grade 3 invasive mammary cancer, one lymph node negative in left axilla, ER 0%, PR 0%, Ki-67 98%, HER-2 negative ratio 1.38   01/06/2015 Breast MRI Left breast large enhancing mass, evidence of necrosis, extending from posterior through the middle third of left breast, extending from chest wall to the dermis, close to pectoralis fascia extending 4.5 cm, several bilateral axillary lymph nodes   01/17/2015 - 05/30/2015 Neo-Adjuvant Chemotherapy Neoadjuvant chemotherapy with dose dense Adriamycin and Cytoxan followed by Taxol and carboplatin   01/24/2015 - 01/27/2015 Hospital Admission Neutropenic fever admission, no source identified   06/08/2015 Breast MRI Interval erosion of the known carcinoma within the superior left breast through the overlying skin. The majority of the mass is now exophytic, decrease in Left Axill LN    CHIEF COMPLIANT: Follow-up of her breast MRI  INTERVAL HISTORY: Patty Buckley is a 64 year old with above-mentioned history of left breast cancer status post neo-adjuvant chemotherapy undergone breast MRI and is here today to discuss the report. The tumor in the left breast has mostly extravasated out of the skin and bleeds intermittently. She has an appointment to see general surgery later today. She will need a mastectomy followed by adjuvant radiation therapy. Initial biopsy of the lymph nodes was negative but MRI revealed that the lymph node somewhat smaller on the current films.  REVIEW OF SYSTEMS:   Constitutional: Denies fevers, chills or abnormal weight loss Eyes: Denies blurriness of  vision Ears, nose, mouth, throat, and face: Denies mucositis or sore throat Respiratory: Denies cough, dyspnea or wheezes Cardiovascular: Denies palpitation, chest discomfort or lower extremity swelling Gastrointestinal:  Denies nausea, heartburn or change in bowel habits Skin: Complains of itching of her hands since last week Lymphatics: Denies new lymphadenopathy or easy bruising Neurological: Mild tingling of the tips of the fingers Behavioral/Psych: Mood is stable, no new changes  Breast: Extravasated tumor out of the skin of the left breast All other systems were reviewed with the patient and are negative.  I have reviewed the past medical history, past surgical history, social history and family history with the patient and they are unchanged from previous note.  ALLERGIES:  is allergic to codeine.  MEDICATIONS:  Current Outpatient Prescriptions  Medication Sig Dispense Refill  . amoxicillin-clavulanate (AUGMENTIN) 875-125 MG per tablet Take 1 tablet by mouth 2 (two) times daily. 14 tablet 0  . aspirin 81 MG tablet Take 81 mg by mouth daily.      Marland Kitchen HYDROcodone-acetaminophen (NORCO/VICODIN) 5-325 MG per tablet Take 1-2 tablets by mouth every 4 (four) hours as needed for moderate pain. 30 tablet 0  . lisinopril (PRINIVIL,ZESTRIL) 10 MG tablet Take 1 tablet (10 mg total) by mouth daily. Take 10 mg by mouth every morning. 30 tablet 3  . LORazepam (ATIVAN) 0.5 MG tablet Take 1 tablet (0.5 mg total) by mouth every 6 (six) hours as needed (Nausea or vomiting). 30 tablet 0  . metoprolol (LOPRESSOR) 50 MG tablet Take 1 tablet (50 mg total) by mouth 2 (two) times daily. (Patient taking differently: Take 50 mg by mouth daily. ) 180  tablet 3  . Multiple Vitamin (MULTIVITAMIN) tablet Take 1 tablet by mouth daily.     . ondansetron (ZOFRAN) 8 MG tablet Take 1 tablet (8 mg total) by mouth 2 (two) times daily as needed. Start on the third day after chemotherapy. 30 tablet 1   No current  facility-administered medications for this visit.    PHYSICAL EXAMINATION: ECOG PERFORMANCE STATUS: 1 - Symptomatic but completely ambulatory  Filed Vitals:   06/14/15 1126  BP: 141/69  Pulse: 113  Temp: 98.1 F (36.7 C)  Resp: 18   Filed Weights   06/14/15 1126  Weight: 159 lb (72.122 kg)    GENERAL:alert, no distress and comfortable SKIN: skin color, texture, turgor are normal, no rashes or significant lesions EYES: normal, Conjunctiva are pink and non-injected, sclera clear OROPHARYNX:no exudate, no erythema and lips, buccal mucosa, and tongue normal  NECK: supple, thyroid normal size, non-tender, without nodularity LYMPH:  no palpable lymphadenopathy in the cervical, axillary or inguinal LUNGS: clear to auscultation and percussion with normal breathing effort HEART: regular rate & rhythm and no murmurs and no lower extremity edema ABDOMEN:abdomen soft, non-tender and normal bowel sounds Musculoskeletal:no cyanosis of digits and no clubbing  NEURO: alert & oriented x 3 with fluent speech, grade 1 neuropathy fingers  LABORATORY DATA:  I have reviewed the data as listed   Chemistry      Component Value Date/Time   NA 139 06/14/2015 1042   NA 140 02/13/2015 1130   K 4.4 06/14/2015 1042   K 4.3 02/13/2015 1130   CL 107 02/13/2015 1130   CO2 24 06/14/2015 1042   CO2 25 02/13/2015 1130   BUN 12.4 06/14/2015 1042   BUN 8 02/13/2015 1130   CREATININE 1.2* 06/14/2015 1042   CREATININE 0.74 02/13/2015 1130      Component Value Date/Time   CALCIUM 10.0 06/14/2015 1042   CALCIUM 9.0 02/13/2015 1130   ALKPHOS 73 06/14/2015 1042   ALKPHOS 53 01/25/2015 0500   AST 29 06/14/2015 1042   AST 12 01/25/2015 0500   ALT 24 06/14/2015 1042   ALT 12 01/25/2015 0500   BILITOT 0.67 06/14/2015 1042   BILITOT 0.5 01/25/2015 0500       Lab Results  Component Value Date   WBC 4.7 06/14/2015   HGB 13.0 06/14/2015   HCT 39.0 06/14/2015   MCV 110.7* 06/14/2015   PLT 261  06/14/2015   NEUTROABS 3.8 06/14/2015   ASSESSMENT & PLAN:  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 Mastectomy 2. Followed by XRT 3. Followed by oral Xeloda as adjuvant therapy, if she has significant residual disease burden after mastectomy CREATE-X study enrolled 910 patient's with HER-2/neu negative breast cancer and residual disease after new adjuvant therapy. 455 patient is assigned to 1250 mg/m times daily 2 weeks on 1 week off  up to 8 cycles, at 5 years BSS 74.1% with keep Cytovene compared to 67.7% in control arm, 30% reduction in risk, overall survival 89.2% versus 83.9%, hand-foot syndrome was seen in 72.3% with Xeloda grade 3 disease in 10.9%  RTC after surgery   No  orders of the defined types were placed in this encounter.   The patient has a good understanding of the overall plan. she agrees with it. she will call with any problems that may develop before the next visit here.   Rulon Eisenmenger, MD

## 2015-06-14 NOTE — H&P (Signed)
History of Present Illness Patty Buckley. Robertt Buda MD; 06/14/2015 5:20 PM) Patient words: discuss surgery.  The patient is a 64 year old female who presents with breast cancer. 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 She had not had a mammogram in 4 years. The mass had become much more firm recently and there is one small area that had begun to protrude with reddening and tenderness of the skin. She underwent mammogram and ultrasound on 12/15/14 followed by biopsy on 12/27/14. The biopsy clip did not deploy but the tumor is easily palpable. An abnormal appearing lymph node in the left axilla was biopsied and showed no sign of carcinoma. 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 A port was placed and she began Neoadjuvant chemotherapy in February. The mass has become exophytic and is eroding through the overlying skin. The lymph node in the axilla has decreased and now appears normal. She presents now to discuss left mastectomy/ SLNB.    CLINICAL DATA: 64 year old female with invasive left breast carcinoma. The patient has completed neoadjuvant chemotherapy and is seen for reassessment. LABS: No labs were performed at the imaging center today. EXAM: BILATERAL BREAST MRI WITH AND WITHOUT CONTRAST TECHNIQUE: Multiplanar, multisequence MR images of both breasts were obtained prior to and following the intravenous administration of 15 ml of MultiHance. THREE-DIMENSIONAL MR IMAGE RENDERING ON INDEPENDENT WORKSTATION: Three-dimensional MR images were rendered by post-processing of the original MR data on an independent workstation. The three-dimensional MR images were interpreted, and findings are reported in the following complete MRI report for this study. Three dimensional images were evaluated at the independent  [employer] workstation COMPARISON: Multiple prior mammograms, the most recent dated 04/03/2015. Left breast ultrasound dated 12/15/2014. Bilateral breast MRI dated 01/06/2015. FINDINGS: Breast composition: b. Scattered fibroglandular tissue. Background parenchymal enhancement: Mild. Right breast: No suspicious enhancement is identified within the right breast Left breast: A significant portion of the previously noted large, irregular enhancing mass within the superior left breast has eroded through the overlying skin and is now exophytic. There is extensive enhancement and thickening of the skin of the superior left breast. The mass now measures approximately 6.1 (TR) x 5.7 (AP) x 5.7 cm (CC) from 5.9 (TR) x 5.3 (AP) x 6.2 cm (CC) on MRI dated 01/06/2015. The overall shape of the mass has changed making precise comparison difficult. There is a small 7 mm focus of rim enhancement along the inferolateral aspect of the mass. Lymph nodes: There has been a small interval decrease in size of the previously noted enlarged left axillary lymph nodes. No suspicious right axillary or internal mammary lymph nodes are identified. Ancillary findings: A right Port-A-Cath is noted. IMPRESSION: 1. Interval erosion of the known carcinoma within the superior left breast through the overlying skin. The majority of the mass is now exophytic. There is extensive associated enhancement and thickening of the skin of the superior left breast. 2. There has been a small interval decrease in size of the previously noted enlarged left axillary lymph nodes. RECOMMENDATION: Treatment plan. BI-RADS CATEGORY 6: Known biopsy-proven malignancy. Electronically Signed By: Pamelia Hoit M.D. On: 06/08/2015 12:37       Allergies (Ammie Eversole, LPN; 01/15/2535 6:44 PM) Codeine Sulfate *ANALGESICS - OPIOID*  Medication History (Ammie Eversole, LPN; 0/02/4741 5:95 PM) Aspirin EC (81MG  Tablet DR, Oral daily)  Active. Lisinopril (  20MG  Tablet, Oral daily) Active. Tylenol 8 Hour (650MG  Tablet ER, Oral as needed) Active. Multiple Vitamin (1 (one) Oral daily) Active. Amoxicillin-Pot Clavulanate (875-125MG  Tablet, Oral) Active. Ondansetron HCl (8MG  Tablet, Oral) Active.    Vitals (Ammie Eversole LPN; 4/0/9735 3:29 PM) 06/14/2015 1:51 PM Weight: 159.2 lb Height: 63in Body Surface Area: 1.79 m Body Mass Index: 28.2 kg/m Temp.: 99.55F(Oral)  Pulse: 70 (Regular)  BP: 104/72 (Sitting, Left Arm, Standard)     Physical Exam Rodman Key K. Emmanuelle Coxe MD; 06/14/2015 5:19 PM)  The physical exam findings are as follows: Note:WDWN in NAD HEENT: EOMI, sclera anicteric Neck: No masses, no thyromegaly Lungs: CTA bilaterally; normal respiratory effort Breast - left breast with large 7 cm black exophytic mass extending from upper part of breast; there is some oozing from the surface of this mass. No axillary lymphadenopathy. No right breast mass. CV: Regular rate and rhythm; no murmurs Abd: +bowel sounds, soft, non-tender, no masses Ext: Well-perfused; no edema Skin: Warm, dry; no sign of jaundice    Assessment & Plan Rodman Key K. Stanton Kissoon MD; 06/14/2015 5:20 PM)  MALIGNANT NEOPLASM OF UPPER-INNER QUADRANT OF LEFT FEMALE BREAST (174.2  C50.212)  Current Plans Schedule for Surgery - Left mastectomy with sentinel lymph node biopsy. The surgical procedure has been discussed with the patient. Potential risks, benefits, alternative treatments, and expected outcomes have been explained. All of the patient's questions at this time have been answered. The likelihood of reaching the patient's treatment goal is good. The patient understand the proposed surgical procedure and wishes to proceed. Note:Large exophytic left breast cancer  DIAGNOSIS: Breast cancer of upper-inner quadrant of left female breast Clinical: Stage IIIB (T4b, N0, M0)   Plan: Left mastectomy with sentinel lymph node biopsy. The  patient's incision will need to angled and adjusted to allow for adequate flap coverage of the pectoralis muscle. This might be fairly difficult.  Patty Buckley. Georgette Dover, MD, [facility]  General/ Trauma Surgery  06/14/2015 5:21 PM

## 2015-06-16 ENCOUNTER — Telehealth: Payer: Self-pay | Admitting: *Deleted

## 2015-06-16 ENCOUNTER — Other Ambulatory Visit: Payer: Self-pay | Admitting: *Deleted

## 2015-06-16 DIAGNOSIS — C50212 Malignant neoplasm of upper-inner quadrant of left female breast: Secondary | ICD-10-CM

## 2015-06-16 MED ORDER — AMOXICILLIN-POT CLAVULANATE 875-125 MG PO TABS: 1.0000 | ORAL_TABLET | Freq: Two times a day (BID) | ORAL | Status: DC

## 2015-06-16 NOTE — Telephone Encounter (Signed)
Prescription for Augmentin called to pharmacy. Patient notified.

## 2015-06-16 NOTE — Telephone Encounter (Signed)
VM message received @ 9:47 am from patient.  TC to her and she states she is scheduled for surgery on 06/23/15, but she has had a fever off and on since Sunday, 06/11/15 ranging from 99.9 to 101.  She had been on antibiotics last week and finished on Friday, July 1,2016. Then fevers started on Sunday. She called her surgeon, who recommended she contact Dr. Lindi Adie for possible antibiotic. She does not want anything to interfere or postpone her surgery next week.  She denies any other symptoms other than occasional diarrhea (2x a day)

## 2015-06-17 ENCOUNTER — Ambulatory Visit: Payer: Self-pay | Admitting: Surgery

## 2015-06-17 DIAGNOSIS — C50912 Malignant neoplasm of unspecified site of left female breast: Secondary | ICD-10-CM

## 2015-06-19 ENCOUNTER — Encounter: Payer: Self-pay | Admitting: *Deleted

## 2015-06-19 ENCOUNTER — Telehealth: Payer: Self-pay | Admitting: Hematology and Oncology

## 2015-06-19 ENCOUNTER — Ambulatory Visit: Payer: Self-pay | Admitting: Surgery

## 2015-06-19 DIAGNOSIS — C50912 Malignant neoplasm of unspecified site of left female breast: Secondary | ICD-10-CM

## 2015-06-19 NOTE — Telephone Encounter (Signed)
lvm for pt regarding to July appt....mailed pt appt sched adn letter °

## 2015-06-22 ENCOUNTER — Encounter (HOSPITAL_COMMUNITY): Payer: Self-pay

## 2015-06-22 ENCOUNTER — Encounter (HOSPITAL_COMMUNITY)
Admission: RE | Admit: 2015-06-22 | Discharge: 2015-06-22 | Disposition: A | Discharge status: Home / Self Care | Payer: Medicare Other | Source: Ambulatory Visit | Attending: Surgery | Admitting: Surgery

## 2015-06-22 HISTORY — DX: Cardiac murmur, unspecified: R01.1

## 2015-06-22 HISTORY — DX: Reserved for concepts with insufficient information to code with codable children: IMO0002

## 2015-06-22 LAB — BASIC METABOLIC PANEL
Anion gap: 10 (ref 5–15)
BUN: 7 mg/dL (ref 6–20)
CHLORIDE: 101 mmol/L (ref 101–111)
CO2: 25 mmol/L (ref 22–32)
CREATININE: 0.74 mg/dL (ref 0.44–1.00)
Calcium: 9.9 mg/dL (ref 8.9–10.3)
Glucose, Bld: 132 mg/dL — ABNORMAL HIGH (ref 65–99)
Potassium: 3.9 mmol/L (ref 3.5–5.1)
Sodium: 136 mmol/L (ref 135–145)

## 2015-06-22 LAB — CBC
HCT: 35.5 % — ABNORMAL LOW (ref 36.0–46.0)
Hemoglobin: 11.8 g/dL — ABNORMAL LOW (ref 12.0–15.0)
MCH: 34.8 pg — ABNORMAL HIGH (ref 26.0–34.0)
MCHC: 33.2 g/dL (ref 30.0–36.0)
MCV: 104.7 fL — AB (ref 78.0–100.0)
PLATELETS: 376 10*3/uL (ref 150–400)
RBC: 3.39 MIL/uL — ABNORMAL LOW (ref 3.87–5.11)
RDW: 15.1 % (ref 11.5–15.5)
WBC: 8 10*3/uL (ref 4.0–10.5)

## 2015-06-22 MED ORDER — CHLORHEXIDINE GLUCONATE 4 % EX LIQD: 1.0000 "application " | Freq: Once | CUTANEOUS | Status: DC

## 2015-06-22 MED ORDER — CEFAZOLIN SODIUM-DEXTROSE 2-3 GM-% IV SOLR
2.0000 g | INTRAVENOUS | Status: AC
  Administered 2015-06-23: 2 g via INTRAVENOUS
  Filled 2015-06-22: qty 50

## 2015-06-22 NOTE — Anesthesia Preprocedure Evaluation (Addendum)
Anesthesia Evaluation  Patient identified by MRN, date of birth, ID band Patient awake    Reviewed: Allergy & Precautions, NPO status , Patient's Chart, lab work & pertinent test results, reviewed documented beta blocker date and time   Airway Mallampati: II   Neck ROM: Full    Dental  (+) Dental Advisory Given   Pulmonary Current Smoker,  breath sounds clear to auscultation        Cardiovascular hypertension, Pt. on medications Rhythm:Regular  ECHO 01/2015 EF 65%, Hypertrophic cardiomyopathy   Neuro/Psych Anxiety    GI/Hepatic negative GI ROS, Neg liver ROS,   Endo/Other  negative endocrine ROS  Renal/GU negative Renal ROS     Musculoskeletal   Abdominal (+)  Abdomen: soft.    Peds  Hematology 12/36   Anesthesia Other Findings   Reproductive/Obstetrics                            Anesthesia Physical Anesthesia Plan  ASA: III  Anesthesia Plan: General   Post-op Pain Management: GA combined w/ Regional for post-op pain   Induction: Intravenous  Airway Management Planned: Oral ETT  Additional Equipment:   Intra-op Plan:   Post-operative Plan:   Informed Consent: I have reviewed the patients History and Physical, chart, labs and discussed the procedure including the risks, benefits and alternatives for the proposed anesthesia with the patient or authorized representative who has indicated his/her understanding and acceptance.     Plan Discussed with:   Anesthesia Plan Comments: (Will offer PEC block)        Anesthesia Quick Evaluation

## 2015-06-22 NOTE — Pre-Procedure Instructions (Signed)
    Patty Buckley  06/22/2015      [facility] - [place], [place] - [address] [place] [place] [address] Phone: [phone] Fax: [phone]    Your procedure is scheduled on Friday, July 15th, at 7:30 AM.  Report to [place] at 5:30 A.M.  Call this number if you have problems the morning of surgery:  [phone]   Remember:  Do not eat food or drink liquids after midnight.   Take these medicines the morning of surgery with A SIP OF WATER Amoxicililn-clavulanate (Augmentin), Hydrocodone-Acetaminophen (Norco) if needed, Metoprolol (Lopressor).  Stop taking: NSAIDS, Aspirin, Aleve, Naproxen, Ibuprofen, Advil, BC's, Goody's, Fish Oil, all herbal medications, and all vitamins.    Do not wear jewelry, make-up or nail polish.  Do not wear lotions, powders, or perfumes.  You may NOT wear deodorant.  Do not shave 48 hours prior to surgery.    Do not bring valuables to the hospital.  [facility] is not responsible for any belongings or valuables.  Contacts, dentures or bridgework may not be worn into surgery.  Leave your suitcase in the car.  After surgery it may be brought to your room.  For patients admitted to the hospital, discharge time will be determined by your treatment team.  Patients discharged the day of surgery will not be allowed to drive home.   Special instructions:  See attached.   Please read over the following fact sheets that you were given. Pain Booklet, Coughing and Deep Breathing and Surgical Site Infection Prevention

## 2015-06-22 NOTE — Progress Notes (Signed)
PCP- Dr. Antony Blackbird  Cardiologist - Dr. Acie Fredrickson  EKG- 11/15/14 -Epic CXR - 01/24/15 - Epic  Echo - 2/16  - Epic Stress Test - 04/2007 - Epic Cardiac Cath - denies

## 2015-06-23 ENCOUNTER — Ambulatory Visit (HOSPITAL_COMMUNITY): Payer: Medicare Other | Admitting: Anesthesiology

## 2015-06-23 ENCOUNTER — Inpatient Hospital Stay (HOSPITAL_COMMUNITY)
Admission: RE | Admit: 2015-06-23 | Discharge: 2015-06-24 | DRG: 582 | Disposition: A | Discharge status: Home / Self Care | Payer: Medicare Other | Source: Ambulatory Visit | Attending: Surgery | Admitting: Surgery

## 2015-06-23 ENCOUNTER — Encounter (HOSPITAL_COMMUNITY): Payer: Self-pay | Admitting: Surgery

## 2015-06-23 ENCOUNTER — Encounter (HOSPITAL_COMMUNITY)
Admission: RE | Admit: 2015-06-23 | Discharge: 2015-06-23 | Disposition: A | Discharge status: Home / Self Care | Payer: Medicare Other | Source: Ambulatory Visit | Attending: Surgery | Admitting: Surgery

## 2015-06-23 ENCOUNTER — Encounter (HOSPITAL_COMMUNITY)
Admission: RE | Disposition: A | Discharge status: Home / Self Care | Payer: Self-pay | Source: Ambulatory Visit | Attending: Surgery

## 2015-06-23 DIAGNOSIS — Z7982 Long term (current) use of aspirin: Secondary | ICD-10-CM

## 2015-06-23 DIAGNOSIS — Z885 Allergy status to narcotic agent status: Secondary | ICD-10-CM | POA: Diagnosis not present

## 2015-06-23 DIAGNOSIS — C50219 Malignant neoplasm of upper-inner quadrant of unspecified female breast: Secondary | ICD-10-CM | POA: Diagnosis present

## 2015-06-23 DIAGNOSIS — C50212 Malignant neoplasm of upper-inner quadrant of left female breast: Secondary | ICD-10-CM | POA: Diagnosis present

## 2015-06-23 DIAGNOSIS — I421 Obstructive hypertrophic cardiomyopathy: Secondary | ICD-10-CM | POA: Diagnosis present

## 2015-06-23 DIAGNOSIS — Z720 Tobacco use: Secondary | ICD-10-CM

## 2015-06-23 DIAGNOSIS — C50919 Malignant neoplasm of unspecified site of unspecified female breast: Secondary | ICD-10-CM | POA: Diagnosis present

## 2015-06-23 DIAGNOSIS — C50912 Malignant neoplasm of unspecified site of left female breast: Secondary | ICD-10-CM | POA: Insufficient documentation

## 2015-06-23 HISTORY — DX: Pneumonia, unspecified organism: J18.9

## 2015-06-23 HISTORY — PX: TOTAL MASTECTOMY: SHX6129

## 2015-06-23 HISTORY — PX: PERIPHERAL VASCULAR CATHETERIZATION: SHX172C

## 2015-06-23 HISTORY — PX: MASTECTOMY: SHX3

## 2015-06-23 HISTORY — DX: Unspecified osteoarthritis, unspecified site: M19.90

## 2015-06-23 HISTORY — PX: PORT-A-CATH REMOVAL: SHX5289

## 2015-06-23 HISTORY — DX: Malignant neoplasm of unspecified site of left female breast: C50.912

## 2015-06-23 HISTORY — DX: Unspecified chronic bronchitis: J42

## 2015-06-23 LAB — CREATININE, SERUM
Creatinine, Ser: 0.75 mg/dL (ref 0.44–1.00)
GFR calc non Af Amer: >60 mL/min (ref >60)

## 2015-06-23 LAB — CBC
HEMATOCRIT: 32.8 % — AB (ref 36.0–46.0)
HEMOGLOBIN: 10.9 g/dL — AB (ref 12.0–15.0)
MCH: 35 pg — ABNORMAL HIGH (ref 26.0–34.0)
MCHC: 33.2 g/dL (ref 30.0–36.0)
MCV: 105.5 fL — ABNORMAL HIGH (ref 78.0–100.0)
Platelets: UNDETERMINED 10*3/uL (ref 150–400)
RBC: 3.11 MIL/uL — AB (ref 3.87–5.11)
RDW: 15.5 % (ref 11.5–15.5)
WBC: 6.1 10*3/uL (ref 4.0–10.5)

## 2015-06-23 SURGERY — PORTA CATH REMOVAL
Anesthesia: General | Site: Chest | Laterality: Right

## 2015-06-23 MED ORDER — FENTANYL CITRATE (PF) 250 MCG/5ML IJ SOLN
INTRAMUSCULAR | Status: AC
  Filled 2015-06-23: qty 5

## 2015-06-23 MED ORDER — NEOSTIGMINE METHYLSULFATE 10 MG/10ML IV SOLN
INTRAVENOUS | Status: DC | PRN
  Administered 2015-06-23: 3 mg via INTRAVENOUS

## 2015-06-23 MED ORDER — CEFAZOLIN SODIUM 1-5 GM-% IV SOLN
1.0000 g | Freq: Four times a day (QID) | INTRAVENOUS | Status: AC
  Administered 2015-06-23 (×2): 1 g via INTRAVENOUS
  Filled 2015-06-23 (×4): qty 50

## 2015-06-23 MED ORDER — LISINOPRIL 10 MG PO TABS
10.0000 mg | ORAL_TABLET | Freq: Every day | ORAL | Status: DC
  Administered 2015-06-24: 10 mg via ORAL
  Filled 2015-06-23: qty 1

## 2015-06-23 MED ORDER — ROCURONIUM BROMIDE 50 MG/5ML IV SOLN
INTRAVENOUS | Status: AC
  Filled 2015-06-23: qty 1

## 2015-06-23 MED ORDER — MIDAZOLAM HCL 2 MG/2ML IJ SOLN
INTRAMUSCULAR | Status: AC
  Filled 2015-06-23: qty 2

## 2015-06-23 MED ORDER — ENSURE ENLIVE PO LIQD
237.0000 mL | Freq: Two times a day (BID) | ORAL | Status: DC
  Administered 2015-06-23 – 2015-06-24 (×2): 237 mL via ORAL

## 2015-06-23 MED ORDER — NEOSTIGMINE METHYLSULFATE 10 MG/10ML IV SOLN
INTRAVENOUS | Status: AC
  Filled 2015-06-23: qty 4

## 2015-06-23 MED ORDER — MIDAZOLAM HCL 5 MG/5ML IJ SOLN
INTRAMUSCULAR | Status: DC | PRN
  Administered 2015-06-23 (×2): 1 mg via INTRAVENOUS

## 2015-06-23 MED ORDER — ENOXAPARIN SODIUM 40 MG/0.4ML ~~LOC~~ SOLN
40.0000 mg | SUBCUTANEOUS | Status: DC
  Administered 2015-06-24: 40 mg via SUBCUTANEOUS
  Filled 2015-06-23: qty 0.4

## 2015-06-23 MED ORDER — ONDANSETRON HCL 4 MG PO TABS: 4.0000 mg | ORAL_TABLET | Freq: Four times a day (QID) | ORAL | Status: DC | PRN

## 2015-06-23 MED ORDER — BUPIVACAINE-EPINEPHRINE 0.25% -1:200000 IJ SOLN
INTRAMUSCULAR | Status: DC | PRN
  Administered 2015-06-23: 7 mL

## 2015-06-23 MED ORDER — METOPROLOL TARTRATE 25 MG PO TABS
25.0000 mg | ORAL_TABLET | Freq: Two times a day (BID) | ORAL | Status: DC
  Administered 2015-06-23 – 2015-06-24 (×2): 25 mg via ORAL
  Filled 2015-06-23 (×2): qty 1

## 2015-06-23 MED ORDER — PHENYLEPHRINE HCL 10 MG/ML IJ SOLN
INTRAMUSCULAR | Status: DC | PRN
  Administered 2015-06-23 (×2): 120 ug via INTRAVENOUS
  Administered 2015-06-23: 40 ug via INTRAVENOUS
  Administered 2015-06-23: 120 ug via INTRAVENOUS

## 2015-06-23 MED ORDER — FENTANYL CITRATE (PF) 100 MCG/2ML IJ SOLN
INTRAMUSCULAR | Status: DC | PRN
  Administered 2015-06-23 (×6): 50 ug via INTRAVENOUS

## 2015-06-23 MED ORDER — METHYLENE BLUE 1 % INJ SOLN
INTRAMUSCULAR | Status: AC
  Filled 2015-06-23: qty 10

## 2015-06-23 MED ORDER — ONDANSETRON HCL 4 MG/2ML IJ SOLN
INTRAMUSCULAR | Status: AC
  Filled 2015-06-23: qty 2

## 2015-06-23 MED ORDER — GLYCOPYRROLATE 0.2 MG/ML IJ SOLN
INTRAMUSCULAR | Status: DC | PRN
  Administered 2015-06-23: 0.4 mg via INTRAVENOUS

## 2015-06-23 MED ORDER — PROPOFOL 10 MG/ML IV BOLUS
INTRAVENOUS | Status: AC
  Filled 2015-06-23: qty 20

## 2015-06-23 MED ORDER — 0.9 % SODIUM CHLORIDE (POUR BTL) OPTIME
TOPICAL | Status: DC | PRN
  Administered 2015-06-23: 1000 mL

## 2015-06-23 MED ORDER — MORPHINE SULFATE 2 MG/ML IJ SOLN
2.0000 mg | INTRAMUSCULAR | Status: DC | PRN
  Administered 2015-06-23: 4 mg via INTRAVENOUS
  Filled 2015-06-23: qty 2

## 2015-06-23 MED ORDER — METHYLENE BLUE 1 % INJ SOLN
INTRAMUSCULAR | Status: DC | PRN
  Administered 2015-06-23: 08:00:00 via INTRAMUSCULAR

## 2015-06-23 MED ORDER — FENTANYL CITRATE (PF) 100 MCG/2ML IJ SOLN: 25.0000 ug | INTRAMUSCULAR | Status: DC | PRN

## 2015-06-23 MED ORDER — KETOROLAC TROMETHAMINE 15 MG/ML IJ SOLN
15.0000 mg | Freq: Four times a day (QID) | INTRAMUSCULAR | Status: AC
  Administered 2015-06-23: 15 mg via INTRAVENOUS
  Filled 2015-06-23: qty 1

## 2015-06-23 MED ORDER — ACETAMINOPHEN 10 MG/ML IV SOLN
INTRAVENOUS | Status: DC | PRN
  Administered 2015-06-23: 1000 mg via INTRAVENOUS

## 2015-06-23 MED ORDER — ONDANSETRON HCL 4 MG/2ML IJ SOLN: 4.0000 mg | Freq: Four times a day (QID) | INTRAMUSCULAR | Status: DC | PRN

## 2015-06-23 MED ORDER — DEXAMETHASONE SODIUM PHOSPHATE 4 MG/ML IJ SOLN
INTRAMUSCULAR | Status: AC
  Filled 2015-06-23: qty 2

## 2015-06-23 MED ORDER — ROCURONIUM BROMIDE 100 MG/10ML IV SOLN
INTRAVENOUS | Status: DC | PRN
  Administered 2015-06-23: 40 mg via INTRAVENOUS

## 2015-06-23 MED ORDER — PROMETHAZINE HCL 25 MG/ML IJ SOLN: 6.2500 mg | INTRAMUSCULAR | Status: DC | PRN

## 2015-06-23 MED ORDER — KETOROLAC TROMETHAMINE 15 MG/ML IJ SOLN: 15.0000 mg | Freq: Four times a day (QID) | INTRAMUSCULAR | Status: DC | PRN

## 2015-06-23 MED ORDER — SODIUM CHLORIDE 0.9 % IJ SOLN
INTRAMUSCULAR | Status: AC
  Filled 2015-06-23: qty 10

## 2015-06-23 MED ORDER — POTASSIUM CHLORIDE IN NACL 20-0.9 MEQ/L-% IV SOLN
INTRAVENOUS | Status: DC
  Administered 2015-06-23: 12:00:00 via INTRAVENOUS
  Administered 2015-06-24: 1 mL via INTRAVENOUS
  Filled 2015-06-23 (×2): qty 1000

## 2015-06-23 MED ORDER — EPHEDRINE SULFATE 50 MG/ML IJ SOLN
INTRAMUSCULAR | Status: AC
  Filled 2015-06-23: qty 1

## 2015-06-23 MED ORDER — TECHNETIUM TC 99M SULFUR COLLOID FILTERED
1.0000 | Freq: Once | INTRAVENOUS | Status: AC | PRN
  Administered 2015-06-23: 1 via INTRADERMAL

## 2015-06-23 MED ORDER — MEPERIDINE HCL 25 MG/ML IJ SOLN: 6.2500 mg | INTRAMUSCULAR | Status: DC | PRN

## 2015-06-23 MED ORDER — LACTATED RINGERS IV SOLN
INTRAVENOUS | Status: DC | PRN
  Administered 2015-06-23 (×2): via INTRAVENOUS

## 2015-06-23 MED ORDER — SUCCINYLCHOLINE CHLORIDE 20 MG/ML IJ SOLN
INTRAMUSCULAR | Status: AC
  Filled 2015-06-23: qty 1

## 2015-06-23 MED ORDER — PHENYLEPHRINE HCL 10 MG/ML IJ SOLN
10.0000 mg | INTRAVENOUS | Status: DC | PRN
  Administered 2015-06-23: 20 ug/min via INTRAVENOUS

## 2015-06-23 MED ORDER — OXYCODONE-ACETAMINOPHEN 5-325 MG PO TABS
1.0000 | ORAL_TABLET | ORAL | Status: DC | PRN
  Administered 2015-06-23 – 2015-06-24 (×2): 1 via ORAL
  Filled 2015-06-23 (×2): qty 1

## 2015-06-23 MED ORDER — GLYCOPYRROLATE 0.2 MG/ML IJ SOLN
INTRAMUSCULAR | Status: AC
  Filled 2015-06-23: qty 2

## 2015-06-23 MED ORDER — BUPIVACAINE-EPINEPHRINE (PF) 0.25% -1:200000 IJ SOLN
INTRAMUSCULAR | Status: AC
  Filled 2015-06-23: qty 30

## 2015-06-23 MED ORDER — DIPHENHYDRAMINE HCL 25 MG PO CAPS: 25.0000 mg | ORAL_CAPSULE | Freq: Four times a day (QID) | ORAL | Status: DC | PRN

## 2015-06-23 MED ORDER — ONDANSETRON HCL 4 MG/2ML IJ SOLN
INTRAMUSCULAR | Status: DC | PRN
  Administered 2015-06-23: 4 mg via INTRAVENOUS

## 2015-06-23 MED ORDER — LIDOCAINE HCL (CARDIAC) 20 MG/ML IV SOLN
INTRAVENOUS | Status: DC | PRN
  Administered 2015-06-23: 100 mg via INTRAVENOUS

## 2015-06-23 MED ORDER — PROPOFOL 10 MG/ML IV BOLUS
INTRAVENOUS | Status: DC | PRN
  Administered 2015-06-23: 170 mg via INTRAVENOUS

## 2015-06-23 MED ORDER — LIDOCAINE HCL (CARDIAC) 20 MG/ML IV SOLN
INTRAVENOUS | Status: AC
  Filled 2015-06-23: qty 5

## 2015-06-23 MED ORDER — ACETAMINOPHEN 10 MG/ML IV SOLN
INTRAVENOUS | Status: AC
  Filled 2015-06-23: qty 100

## 2015-06-23 MED ORDER — STERILE WATER FOR INJECTION IJ SOLN
INTRAMUSCULAR | Status: AC
  Filled 2015-06-23: qty 20

## 2015-06-23 MED ORDER — DEXAMETHASONE SODIUM PHOSPHATE 4 MG/ML IJ SOLN
INTRAMUSCULAR | Status: DC | PRN
  Administered 2015-06-23: 8 mg via INTRAVENOUS

## 2015-06-23 SURGICAL SUPPLY — 62 items
APL SKNCLS STERI-STRIP NONHPOA (GAUZE/BANDAGES/DRESSINGS) ×3
APPLIER CLIP 9.375 MED OPEN (MISCELLANEOUS) ×5
APR CLP MED 9.3 20 MLT OPN (MISCELLANEOUS) ×3
BENZOIN TINCTURE PRP APPL 2/3 (GAUZE/BANDAGES/DRESSINGS) ×3 IMPLANT
BINDER BREAST LRG (GAUZE/BANDAGES/DRESSINGS) ×3 IMPLANT
BINDER BREAST XLRG (GAUZE/BANDAGES/DRESSINGS) IMPLANT
CANISTER SUCTION 2500CC (MISCELLANEOUS) ×5 IMPLANT
CHLORAPREP W/TINT 26ML (MISCELLANEOUS) ×5 IMPLANT
CLIP APPLIE 9.375 MED OPEN (MISCELLANEOUS) ×1 IMPLANT
CLOSURE WOUND 1/2 X4 (GAUZE/BANDAGES/DRESSINGS) ×1
CONT SPEC 4OZ CLIKSEAL STRL BL (MISCELLANEOUS) ×5 IMPLANT
COVER MAYO STAND STRL (DRAPES) ×3 IMPLANT
COVER PROBE W GEL 5X96 (DRAPES) ×5 IMPLANT
COVER SURGICAL LIGHT HANDLE (MISCELLANEOUS) ×8 IMPLANT
DRAIN CHANNEL 19F RND (DRAIN) ×5 IMPLANT
DRAPE LAPAROSCOPIC ABDOMINAL (DRAPES) ×5 IMPLANT
DRAPE LAPAROTOMY 100X72 PEDS (DRAPES) ×3 IMPLANT
DRAPE PROXIMA HALF (DRAPES) ×3 IMPLANT
DRAPE UTILITY XL STRL (DRAPES) ×7 IMPLANT
DRSG PAD ABDOMINAL 8X10 ST (GAUZE/BANDAGES/DRESSINGS) ×10 IMPLANT
DRSG TEGADERM 2-3/8X2-3/4 SM (GAUZE/BANDAGES/DRESSINGS) ×3 IMPLANT
ELECT BLADE 4.0 EZ CLEAN MEGAD (MISCELLANEOUS) ×5
ELECT CAUTERY BLADE 6.4 (BLADE) ×8 IMPLANT
ELECT REM PT RETURN 9FT ADLT (ELECTROSURGICAL) ×5
ELECTRODE BLDE 4.0 EZ CLN MEGD (MISCELLANEOUS) ×3 IMPLANT
ELECTRODE REM PT RTRN 9FT ADLT (ELECTROSURGICAL) ×3 IMPLANT
EVACUATOR SILICONE 100CC (DRAIN) ×5 IMPLANT
GAUZE SPONGE 2X2 8PLY STRL LF (GAUZE/BANDAGES/DRESSINGS) ×1 IMPLANT
GAUZE SPONGE 4X4 12PLY STRL (GAUZE/BANDAGES/DRESSINGS) ×5 IMPLANT
GAUZE SPONGE 4X4 16PLY XRAY LF (GAUZE/BANDAGES/DRESSINGS) ×3 IMPLANT
GAUZE XEROFORM 5X9 LF (GAUZE/BANDAGES/DRESSINGS) ×3 IMPLANT
GLOVE BIO SURGEON STRL SZ7 (GLOVE) ×17 IMPLANT
GLOVE BIOGEL PI IND STRL 7.0 (GLOVE) ×1 IMPLANT
GLOVE BIOGEL PI IND STRL 7.5 (GLOVE) ×4 IMPLANT
GLOVE BIOGEL PI INDICATOR 7.0 (GLOVE) ×2
GLOVE BIOGEL PI INDICATOR 7.5 (GLOVE) ×4
GOWN STRL REUS W/ TWL LRG LVL3 (GOWN DISPOSABLE) ×7 IMPLANT
GOWN STRL REUS W/TWL LRG LVL3 (GOWN DISPOSABLE) ×15
KIT BASIN OR (CUSTOM PROCEDURE TRAY) ×5 IMPLANT
KIT ROOM TURNOVER OR (KITS) ×5 IMPLANT
NDL 18GX1X1/2 (RX/OR ONLY) (NEEDLE) ×2 IMPLANT
NDL HYPO 25GX1X1/2 BEV (NEEDLE) ×2 IMPLANT
NEEDLE 18GX1X1/2 (RX/OR ONLY) (NEEDLE) ×5 IMPLANT
NEEDLE HYPO 25GX1X1/2 BEV (NEEDLE) ×10 IMPLANT
NS IRRIG 1000ML POUR BTL (IV SOLUTION) ×8 IMPLANT
PACK GENERAL/GYN (CUSTOM PROCEDURE TRAY) ×5 IMPLANT
PAD ABD 8X10 STRL (GAUZE/BANDAGES/DRESSINGS) ×6 IMPLANT
PAD ARMBOARD 7.5X6 YLW CONV (MISCELLANEOUS) ×11 IMPLANT
PENCIL BUTTON HOLSTER BLD 10FT (ELECTRODE) ×3 IMPLANT
SOLUTION BETADINE 4OZ (MISCELLANEOUS) ×3 IMPLANT
SPECIMEN JAR LARGE (MISCELLANEOUS) ×3 IMPLANT
SPECIMEN JAR X LARGE (MISCELLANEOUS) ×2 IMPLANT
SPONGE GAUZE 2X2 STER 10/PKG (GAUZE/BANDAGES/DRESSINGS) ×2
STAPLER VISISTAT 35W (STAPLE) ×5 IMPLANT
STRIP CLOSURE SKIN 1/2X4 (GAUZE/BANDAGES/DRESSINGS) ×2 IMPLANT
SUT ETHILON 2 0 FS 18 (SUTURE) ×5 IMPLANT
SUT MNCRL AB 4-0 PS2 18 (SUTURE) ×3 IMPLANT
SUT VIC AB 3-0 SH 18 (SUTURE) ×11 IMPLANT
SYR CONTROL 10ML LL (SYRINGE) ×8 IMPLANT
TAPE CLOTH SURG 4X10 WHT LF (GAUZE/BANDAGES/DRESSINGS) ×3 IMPLANT
TOWEL OR 17X24 6PK STRL BLUE (TOWEL DISPOSABLE) ×8 IMPLANT
TOWEL OR 17X26 10 PK STRL BLUE (TOWEL DISPOSABLE) ×5 IMPLANT

## 2015-06-23 NOTE — Anesthesia Procedure Notes (Addendum)
Anesthesia Regional Block:  Pectoralis block  Pre-Anesthetic Checklist: ,, timeout performed, Correct Patient, Correct Site, Correct Laterality, Correct Procedure, Correct Position, site marked, Risks and benefits discussed,  Surgical consent,  Pre-op evaluation,  At surgeon's request and post-op pain management  Laterality: Left  Prep: Maximum Sterile Barrier Precautions used and chloraprep       Needles:  Injection technique: Single-shot     Needle Length: 9cm 9 cm Needle Gauge: 21 and 21 G    Additional Needles:  Procedures: ultrasound guided (picture in chart) Pectoralis block Narrative:  Start time: 06/23/2015 7:10 AM End time: 06/23/2015 7:20 AM  Performed by: Personally  Anesthesiologist: Alexis Frock  Additional Notes: L PEC 2 block with 62ml .5% marcaine with epi with US guidance.  No complications noted   Procedure Name: Intubation Date/Time: 06/23/2015 7:35 AM Performed by: Garrison [place] T Pre-anesthesia Checklist: Patient identified, Emergency Drugs available, Suction available and Patient being monitored Patient Re-evaluated:Patient Re-evaluated prior to inductionOxygen Delivery Method: Circle system utilized Preoxygenation: Pre-oxygenation with 100% oxygen Intubation Type: IV induction Ventilation: Mask ventilation without difficulty Laryngoscope Size: Mac and 3 Grade View: Grade II Tube type: Oral Tube size: 7.5 mm Number of attempts: 1 Airway Equipment and Method: Stylet Placement Confirmation: ETT inserted through vocal cords under direct vision,  positive ETCO2 and breath sounds checked- equal and bilateral Secured at: 21 cm Tube secured with: Tape Dental Injury: Teeth and Oropharynx as per pre-operative assessment  Comments: Elective intubation by Tresa Moore, MD.

## 2015-06-23 NOTE — Op Note (Signed)
Preop diagnosis: Left breast cancer Postop diagnosis: Same Procedure performed: Left mastectomy and right port removal Surgeon:Quentyn Kolbeck K. Anesthesia: Gen.  Indications: This is a 64 year old female who presented in January 2016 with a very large left upper breast cancer that had begun to erode to the skin. A port was placed on her right side and she began chemotherapy.  She has responded marginally to the chemotherapy and the tumor has continued to grow has become quite exophytic. It has become necrotic. We are asked to perform a mastectomy the patient will continue with radiation therapy as well as continued chemotherapy. Prior surgery I discussed that the patient would have an unusual incision and the skin flaps would be very tight because of location of the tumor in relation to her nipple.  Description of procedure: The patient is brought to the operating room placed in a supine position operative table. She had previously been injected with technetium sulfur colloid by radiology. We prepped the area around her nipple and performed intradermal injection of methylene blue dye solution. We prepped the entire breast with Betadine and draped sterile fashion. I outlined a serpentine incision that included the lower age of the nipple areolar complex. The superior part of the incision went around the large tumor. We incised with #10 blade and raised skin flaps with cautery. Medially we dissected tumor at the edge of the sternum. We raised her skin flaps superiorly until we reached the infraclavicular chest wall.  Inferiorly we dissected down to the chest wall at the inframammary crease. Laterally we dissected until we identified the anterior edge of the latissimus muscle. We continued our dissection up towards the axilla. We interrogated the axilla with the neoprobe. I cannot visualize any blue dye and there was no activity within the axilla. I opened the axillary tissue just behind the pectoralis muscle  and there is no sign of any radioactivity. I can palpate a few lymph nodes in the tail of the breast.  We then dissected the breast off of the chest wall beginning medially working our way towards the axilla. We occluded the tail of the breast that had some palpable lymph nodes within it. None of these appear to be blue. Several small veins were ligated with clips 3-0 Vicryl sutures. The specimen was oriented with a suture at the axilla. This was sent for pathologic examination. We irrigated the wound thoroughly with saline as well as sterile water. Cautery was used for hemostasis. A 19 French drain was placed through a stab incision and was laid up through the axilla laying across the upper part of the skin flap. We then began to close our skin with multiple subcutaneous 3-0 Vicryl sutures. Because of the unusual orientation of the incision as well as the amount of skin that we had to take to include the large tumor, the skin flaps a very tight at the closure. Were able to close completely with subcutaneous 3-0 Vicryl sutures and skin staples. The stable on his current with Xeroform gauze as well as an AVD pad.  We took down our drapes and reprepped the right chest with ChloraPrep and draped sterile fashion. New insurance reasons. We have treated area over the port with 0.25% Marcaine with epinephrine. A transverse incision was made. The port was removed. Direct pressure was held. The subcutaneous tissues closed with 3-0 Vicryl 4 Monocryl was used to close the skin. Station clean dressings were applied to that side. A breast binder was placed around the patient's chest. The drain  was placed to bulb suction. Incidentally the drain was secured with a 2-0 nylon suture. The patient was then next made brought to recovery in stable condition. All sponge, instrument, and needle counts are correct.   Imogene Burn. Georgette Dover, MD, [facility]  General/ Trauma Surgery  06/23/2015 9:55 AM

## 2015-06-23 NOTE — OR Nursing (Signed)
Late entry [id number] at 1011 to document operative meds.

## 2015-06-23 NOTE — Progress Notes (Signed)
Utilization review completed.  L. J. Michael Ventresca RN, BSN, CM 

## 2015-06-23 NOTE — H&P (View-Only) (Signed)
History of Present Illness Patty Buckley. Patty Mathey MD; 06/14/2015 5:20 PM) Patient words: discuss surgery.  The patient is a 64 year old female who presents with breast cancer. 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 She had not had a mammogram in 4 years. The mass had become much more firm recently and there is one small area that had begun to protrude with reddening and tenderness of the skin. She underwent mammogram and ultrasound on 12/15/14 followed by biopsy on 12/27/14. The biopsy clip did not deploy but the tumor is easily palpable. An abnormal appearing lymph node in the left axilla was biopsied and showed no sign of carcinoma. 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 A port was placed and she began Neoadjuvant chemotherapy in February. The mass has become exophytic and is eroding through the overlying skin. The lymph node in the axilla has decreased and now appears normal. She presents now to discuss left mastectomy/ SLNB.    CLINICAL DATA: 64 year old female with invasive left breast carcinoma. The patient has completed neoadjuvant chemotherapy and is seen for reassessment. LABS: No labs were performed at the imaging center today. EXAM: BILATERAL BREAST MRI WITH AND WITHOUT CONTRAST TECHNIQUE: Multiplanar, multisequence MR images of both breasts were obtained prior to and following the intravenous administration of 15 ml of MultiHance. THREE-DIMENSIONAL MR IMAGE RENDERING ON INDEPENDENT WORKSTATION: Three-dimensional MR images were rendered by post-processing of the original MR data on an independent workstation. The three-dimensional MR images were interpreted, and findings are reported in the following complete MRI report for this study. Three dimensional images were evaluated at the independent  D[employer] workstation COMPARISON: Multiple prior mammograms, the most recent dated 04/03/2015. Left breast ultrasound dated 12/15/2014. Bilateral breast MRI dated 01/06/2015. FINDINGS: Breast composition: b. Scattered fibroglandular tissue. Background parenchymal enhancement: Mild. Right breast: No suspicious enhancement is identified within the right breast Left breast: A significant portion of the previously noted large, irregular enhancing mass within the superior left breast has eroded through the overlying skin and is now exophytic. There is extensive enhancement and thickening of the skin of the superior left breast. The mass now measures approximately 6.1 (TR) x 5.7 (AP) x 5.7 cm (CC) from 5.9 (TR) x 5.3 (AP) x 6.2 cm (CC) on MRI dated 01/06/2015. The overall shape of the mass has changed making precise comparison difficult. There is a small 7 mm focus of rim enhancement along the inferolateral aspect of the mass. Lymph nodes: There has been a small interval decrease in size of the previously noted enlarged left axillary lymph nodes. No suspicious right axillary or internal mammary lymph nodes are identified. Ancillary findings: A right Port-A-Cath is noted. IMPRESSION: 1. Interval erosion of the known carcinoma within the superior left breast through the overlying skin. The majority of the mass is now exophytic. There is extensive associated enhancement and thickening of the skin of the superior left breast. 2. There has been a small interval decrease in size of the previously noted enlarged left axillary lymph nodes. RECOMMENDATION: Treatment plan. BI-RADS CATEGORY 6: Known biopsy-proven malignancy. Electronically Signed By: Pamelia Hoit M.D. On: 06/08/2015 12:37       Allergies (Ammie Eversole, LPN; 04/13/3148 7:02 PM) Codeine Sulfate *ANALGESICS - OPIOID*  Medication History (Ammie Eversole, LPN; 05/11/7857 8:50 PM) Aspirin EC (81MG  Tablet DR, Oral daily)  Active. Lisinopril (  20MG  Tablet, Oral daily) Active. Tylenol 8 Hour (650MG  Tablet ER, Oral as needed) Active. Multiple Vitamin (1 (one) Oral daily) Active. Amoxicillin-Pot Clavulanate (875-125MG  Tablet, Oral) Active. Ondansetron HCl (8MG  Tablet, Oral) Active.    Vitals (Ammie Eversole LPN; 03/11/3294 1:88 PM) 06/14/2015 1:51 PM Weight: 159.2 lb Height: 63in Body Surface Area: 1.79 m Body Mass Index: 28.2 kg/m Temp.: 99.55F(Oral)  Pulse: 70 (Regular)  BP: 104/72 (Sitting, Left Arm, Standard)     Physical Exam Rodman Key K. Caylah Plouff MD; 06/14/2015 5:19 PM)  The physical exam findings are as follows: Note:WDWN in NAD HEENT: EOMI, sclera anicteric Neck: No masses, no thyromegaly Lungs: CTA bilaterally; normal respiratory effort Breast - left breast with large 7 cm black exophytic mass extending from upper part of breast; there is some oozing from the surface of this mass. No axillary lymphadenopathy. No right breast mass. CV: Regular rate and rhythm; no murmurs Abd: +bowel sounds, soft, non-tender, no masses Ext: Well-perfused; no edema Skin: Warm, dry; no sign of jaundice    Assessment & Plan Rodman Key K. Carmello Cabiness MD; 06/14/2015 5:20 PM)  MALIGNANT NEOPLASM OF UPPER-INNER QUADRANT OF LEFT FEMALE BREAST (174.2  C50.212)  Current Plans Schedule for Surgery - Left mastectomy with sentinel lymph node biopsy. The surgical procedure has been discussed with the patient. Potential risks, benefits, alternative treatments, and expected outcomes have been explained. All of the patient's questions at this time have been answered. The likelihood of reaching the patient's treatment goal is good. The patient understand the proposed surgical procedure and wishes to proceed. Note:Large exophytic left breast cancer  DIAGNOSIS: Breast cancer of upper-inner quadrant of left female breast Clinical: Stage IIIB (T4b, N0, M0)   Plan: Left mastectomy with sentinel lymph node biopsy. The  patient's incision will need to angled and adjusted to allow for adequate flap coverage of the pectoralis muscle. This might be fairly difficult.  Patty Buckley. Georgette Dover, MD, [facility]  General/ Trauma Surgery  06/14/2015 5:21 PM

## 2015-06-23 NOTE — Anesthesia Postprocedure Evaluation (Signed)
  Anesthesia Post-op Note  Patient: Patty Buckley  Procedure(s) Performed: Procedure(s): PORTA CATH REMOVAL (Right) LEFT MASTECTOMY (Left)  Patient Location: PACU  Anesthesia Type:General and GA combined with regional for post-op pain  Level of Consciousness: awake, alert  and oriented  Airway and Oxygen Therapy: Patient Spontanous Breathing  Post-op Pain: none  Post-op Assessment: Post-op Vital signs reviewed and Patient's Cardiovascular Status Stable              Post-op Vital Signs: Reviewed and stable  Last Vitals:  Filed Vitals:   06/23/15 1037  BP:   Pulse: 73  Temp: 36.5 C  Resp: 19    Complications: No apparent anesthesia complications

## 2015-06-23 NOTE — Transfer of Care (Signed)
Immediate Anesthesia Transfer of Care Note  Patient: Patty Buckley  Procedure(s) Performed: Procedure(s): PORTA CATH REMOVAL (Right) LEFT MASTECTOMY (Left)  Patient Location: PACU  Anesthesia Type:General and Regional  Level of Consciousness: awake, alert  and oriented  Airway & Oxygen Therapy: Patient Spontanous Breathing and Patient connected to nasal cannula oxygen  Post-op Assessment: Report given to RN, Post -op Vital signs reviewed and stable and Patient moving all extremities X 4  Post vital signs: Reviewed and stable  Last Vitals:  Filed Vitals:   06/23/15 0955  BP:   Pulse:   Temp: 93.1 C    Complications: No apparent anesthesia complications

## 2015-06-23 NOTE — Discharge Instructions (Signed)
[facility] °[phone] ° °MASTECTOMY: POST OP INSTRUCTIONS ° °Always review your discharge instruction sheet given to you by the facility where your surgery was performed. °IF YOU HAVE DISABILITY OR FAMILY LEAVE FORMS, YOU MUST BRING THEM TO THE OFFICE FOR PROCESSING.   °DO NOT GIVE THEM TO YOUR DOCTOR. °A prescription for pain medication may be given to you upon discharge.  Take your pain medication as prescribed, if needed.  If narcotic pain medicine is not needed, then you may take acetaminophen (Tylenol) or ibuprofen (Advil) as needed. °1. Take your usually prescribed medications unless otherwise directed. °2. If you need a refill on your pain medication, please contact your pharmacy.  They will contact our office to request authorization.  Prescriptions will not be filled after 5pm or on week-ends. °3. You should follow a light diet the first few days after arrival home, such as soup and crackers, etc.  Resume your normal diet the day after surgery. °4. Most patients will experience some swelling and bruising on the chest and underarm.  Ice packs will help.  Swelling and bruising can take several days to resolve.  °5. It is common to experience some constipation if taking pain medication after surgery.  Increasing fluid intake and taking a stool softener (such as Colace) will usually help or prevent this problem from occurring.  A mild laxative (Milk of Magnesia or Miralax) should be taken according to package instructions if there are no bowel movements after 48 hours. °6. Unless discharge instructions indicate otherwise, leave your bandage dry and in place until your next appointment in 3-5 days.  You may take a limited sponge bath.  No tube baths or showers until the drains are removed.  You may have steri-strips (small skin tapes) in place directly over the incision.  These strips should be left on the skin for 7-10 days.  If your surgeon used skin glue on the incision, you may  shower in 24 hours.  The glue will flake off over the next 2-3 weeks.  Any sutures or staples will be removed at the office during your follow-up visit. °7. DRAINS:  If you have drains in place, it is important to keep a list of the amount of drainage produced each day in your drains.  Before leaving the hospital, you should be instructed on drain care.  Call our office if you have any questions about your drains. °8. ACTIVITIES:  You may resume regular (light) daily activities beginning the next day--such as daily self-care, walking, climbing stairs--gradually increasing activities as tolerated.  You may have sexual intercourse when it is comfortable.  Refrain from any heavy lifting or straining until approved by your doctor. °a. You may drive when you are no longer taking prescription pain medication, you can comfortably wear a seatbelt, and you can safely maneuver your car and apply brakes. °b. RETURN TO WORK:  __________________________________________________________ °9. You should see your doctor in the office for a follow-up appointment approximately 3-5 days after your surgery.  Your doctor’s nurse will typically make your follow-up appointment when she calls you with your pathology report.  Expect your pathology report 2-3 business days after your surgery.  You may call to check if you do not hear from us after three days.   °10. OTHER INSTRUCTIONS: ______________________________________________________________________________________________ ____________________________________________________________________________________________ °WHEN TO CALL YOUR DOCTOR: °1. Fever over 101.0 °2. Nausea and/or vomiting °3. Extreme swelling or bruising °4. Continued bleeding from incision. °5. Increased pain, redness, or drainage from the incision. °  The clinic staff is available to answer your questions during regular business hours.  Please don’t hesitate to call and ask to speak to one of the nurses for clinical  concerns.  If you have a medical emergency, go to the nearest emergency room or call 911.  A surgeon from [facility] is always on call at the hospital. °[address], [address], [place], [place]  [address] ? [address] [address], [place], [place]   [address] °[phone] ? [phone] ? FAX [phone] °Web site: [email or web address] °

## 2015-06-23 NOTE — Interval H&P Note (Signed)
History and Physical Interval Note:  06/23/2015 6:40 AM  Patty Buckley  has presented today for surgery, with the diagnosis of Left Breast Cancer  The various methods of treatment have been discussed with the patient and family. After consideration of risks, benefits and other options for treatment, the patient has consented to  Procedure(s): LEFT MASTECTOMY WITH AXILLARY SENTINEL NODE BIOPSY (Left) as a surgical intervention .  The patient's history has been reviewed, patient examined, no change in status, stable for surgery.  I have reviewed the patient's chart and labs.  Questions were answered to the patient's satisfaction.     Vandora Jaskulski K.

## 2015-06-24 LAB — COMPREHENSIVE METABOLIC PANEL
ALT: 18 U/L (ref 14–54)
AST: 16 U/L (ref 15–41)
Albumin: 2.7 g/dL — ABNORMAL LOW (ref 3.5–5.0)
Alkaline Phosphatase: 58 U/L (ref 38–126)
Anion gap: 7 (ref 5–15)
BUN: 7 mg/dL (ref 6–20)
CO2: 24 mmol/L (ref 22–32)
CREATININE: 0.73 mg/dL (ref 0.44–1.00)
Calcium: 8.3 mg/dL — ABNORMAL LOW (ref 8.9–10.3)
Chloride: 106 mmol/L (ref 101–111)
GFR calc Af Amer: >60 mL/min (ref >60)
GFR calc non Af Amer: >60 mL/min (ref >60)
Glucose, Bld: 135 mg/dL — ABNORMAL HIGH (ref 65–99)
Potassium: 4.2 mmol/L (ref 3.5–5.1)
Sodium: 137 mmol/L (ref 135–145)
TOTAL PROTEIN: 5.6 g/dL — AB (ref 6.5–8.1)
Total Bilirubin: 0.2 mg/dL — ABNORMAL LOW (ref 0.3–1.2)

## 2015-06-24 LAB — CBC
HEMATOCRIT: 29.4 % — AB (ref 36.0–46.0)
HEMOGLOBIN: 9.5 g/dL — AB (ref 12.0–15.0)
MCH: 34.2 pg — AB (ref 26.0–34.0)
MCHC: 32.3 g/dL (ref 30.0–36.0)
MCV: 105.8 fL — ABNORMAL HIGH (ref 78.0–100.0)
Platelets: 335 10*3/uL (ref 150–400)
RBC: 2.78 MIL/uL — ABNORMAL LOW (ref 3.87–5.11)
RDW: 15.3 % (ref 11.5–15.5)
WBC: 9.5 10*3/uL (ref 4.0–10.5)

## 2015-06-24 MED ORDER — HYDROCODONE-ACETAMINOPHEN 5-325 MG PO TABS: 1.0000 | ORAL_TABLET | ORAL | Status: DC | PRN

## 2015-06-24 NOTE — Discharge Summary (Signed)
Physician Discharge Summary [facility]  Patient ID: Patty Buckley MRN: [id number] DOB/AGE: [birth year] 65 y.o.  Admit date: 06/23/2015 Discharge date: 06/24/2015  Admission Diagnoses:  Left breast cancer  Discharge Diagnoses:  Active Problems:   Breast cancer   Discharged Condition: good  Hospital Course: Patient was admitted for observation following breast surgery.  Post op course was uncomplicated.  Pain was well controlled.  Tolerated diet. Patient was prepared for discharge home on POD#1.  Consults: None  Treatments: surgery: left mastectomy  Discharge Exam: Blood pressure 108/60, pulse 85, temperature 98.4 F (36.9 C), temperature source Oral, resp. rate 16, height 5\' 3"  (1.6 m), weight 68.947 kg (152 lb), SpO2 95 %. HEENT - clear Neck - soft Chest - clear bilaterally; dressing dry and intact, breast binder in place; JP with small serosanguinous output Cor - RRR  Disposition: Home  Discharge Instructions    Diet - low sodium heart healthy    Complete by:  As directed      Discharge instructions    Complete by:  As directed   [place]:   Carry a list of your medications and allergies with you at all times  Call your pharmacy at least 1 week in advance to refill prescriptions  Do not mix any prescribed pain medicine with alcohol  Do not drive any motor vehicles while taking pain medication  Take medications with food unless otherwise directed  Follow-up appointments (date to return to physician): Please call [phone] to confirm your follow up appointment with your surgeon.  Call your Surgeon if you have:  Temperature greater than 101.0  Persistent nausea and vomiting  Severe uncontrolled pain  Redness, tenderness, or signs of infection (pain, swelling, redness, odor or    green/yellow discharge around the site)  Difficulty breathing, headache or visual  disturbances  Hives  Persistent dizziness or light-headedness  Any other questions or concerns you may have after discharge  In an emergency, call 911 or go to an Emergency Department at a nearby hospital.   Diet: Begin with liquids, and if they are tolerated, resume your usual diet.  Avoid spicy, greasy or heavy foods.  If you have nausea or vomiting, go back to liquids.  If you cannot keep liquids down, call your doctor.  Avoid alcohol consumption while on prescription pain medications. Good nutrition promotes healing. Increase fiber and fluids.   ADDITIONAL INSTRUCTIONS: Change dressings to left chest as needed with dry gauze or ABD pads.  Drain care as instructed.  Earnstine Regal, MD, [facility] Office: [phone]     Drain care    Complete by:  As directed   Instruct patient in drain care prior to discharge today.     Increase activity slowly    Complete by:  As directed      Leave dressing on - Keep it clean, dry, and intact until clinic visit    Complete by:  As directed             Medication List    TAKE these medications        amoxicillin-clavulanate 875-125 MG per tablet  Commonly known as:  AUGMENTIN  Take 1 tablet by mouth 2 (two) times daily.     aspirin 81 MG tablet  Take 81 mg by mouth daily.     diphenhydrAMINE 25 MG tablet  Commonly known as:  BENADRYL  Take 25 mg by mouth  every 6 (six) hours as needed for itching.     HYDROcodone-acetaminophen 5-325 MG per tablet  Commonly known as:  NORCO/VICODIN  Take 1-2 tablets by mouth every 4 (four) hours as needed for moderate pain.     HYDROcodone-acetaminophen 5-325 MG per tablet  Commonly known as:  NORCO/VICODIN  Take 1-2 tablets by mouth every 4 (four) hours as needed for moderate pain.     lisinopril 10 MG tablet  Commonly known as:  PRINIVIL,ZESTRIL  Take 1 tablet (10 mg total) by mouth daily. Take 10 mg by mouth every morning.     LORazepam 0.5 MG tablet  Commonly  known as:  ATIVAN  Take 1 tablet (0.5 mg total) by mouth every 6 (six) hours as needed (Nausea or vomiting).     metoprolol 50 MG tablet  Commonly known as:  LOPRESSOR  Take 1 tablet (50 mg total) by mouth 2 (two) times daily.     multivitamin tablet  Take 1 tablet by mouth daily.     ondansetron 8 MG tablet  Commonly known as:  ZOFRAN  Take 1 tablet (8 mg total) by mouth 2 (two) times daily as needed. Start on the third day after chemotherapy.           Follow-up Information    Follow up with Maia Petties., MD. Schedule an appointment as soon as possible for a visit in 1 week.   Specialty:  General Surgery   Contact information:   [address] [place] [address] [phone]       Earnstine Regal, MD, [facility] Office: [phone]   Signed: Earnstine Regal 06/24/2015, 8:25 AM

## 2015-06-24 NOTE — Progress Notes (Signed)
[name] to be D/C'd Home per MD order.  Discussed with the patient and all questions fully answered.  VSS, Skin clean, dry and intact without evidence of skin break down, no evidence of skin tears noted. Taught patient and pt's daughter how to empty and charge JP drain and care done. Surgical site and care reviewed with supplies sent with patient.  IV catheter discontinued intact. Site without signs and symptoms of complications. Dressing and pressure applied.  An After Visit Summary was printed and given to the patient. Patient received prescription.  D/c education completed with patient/family including follow up instructions, medication list, d/c activities limitations if indicated, with other d/c instructions as indicated by MD - patient able to verbalize understanding, all questions fully answered.   Patient instructed to return to ED, call 911, or call MD for any changes in condition.   Patient escorted via [place], and D/C home via private auto.  Norva Karvonen 06/24/2015 12:17 PM

## 2015-06-26 ENCOUNTER — Encounter (HOSPITAL_COMMUNITY): Payer: Self-pay | Admitting: Surgery

## 2015-06-29 NOTE — Assessment & Plan Note (Signed)
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: T4bN?Mx clinical stage IIIB ER 0%, PR 0%, HER-2 negative ratio 1.38, Ki-67 98% S/P Neoadjuvant chemo AC x 12 foll by Taxol- Carbo x 12 weekly completed 05/30/15 MRI Breast 06/08/15 Tumor is now exophytic and decrease in size of LN Chemo toxicites: Neutropenic fever admission, fatigue, elevated AST, anemia Left simple mastectomy 06/23/2015: Invasive ductal carcinoma grade 3; 6.5 cm, lymphovascular invasion, margins negative, 0/3 lymph nodes negative, T4b N0M0 stage IIIB pathologic staging, ER 0%, PR 0%, HER-2 negative, Ki-67 98% ----------------------------------------------------------------------------------------------------------------------------------------------- Pathology review: I discussed the final pathology report and provided a copy of this to the patient. We reviewed the tumor size, lymph node status, margin status, ER and PR receptors status as well as grading.  Recommendation: 1. Adjuvant radiation therapy followed by 2. Adjuvant Xeloda 8 cycles (based on recent results from CREATE-X and FINX-X studies  Return to clinic after radiation therapy

## 2015-06-30 ENCOUNTER — Encounter: Payer: Self-pay | Admitting: *Deleted

## 2015-06-30 ENCOUNTER — Encounter: Payer: Self-pay | Admitting: Hematology and Oncology

## 2015-06-30 ENCOUNTER — Ambulatory Visit (HOSPITAL_BASED_OUTPATIENT_CLINIC_OR_DEPARTMENT_OTHER): Payer: Medicare Other | Admitting: Hematology and Oncology

## 2015-06-30 ENCOUNTER — Telehealth: Payer: Self-pay | Admitting: Hematology and Oncology

## 2015-06-30 VITALS — BP 125/66 | HR 89 | Temp 97.8°F | Resp 18 | Ht 63.0 in | Wt 154.1 lb

## 2015-06-30 DIAGNOSIS — C50212 Malignant neoplasm of upper-inner quadrant of left female breast: Secondary | ICD-10-CM | POA: Diagnosis not present

## 2015-06-30 DIAGNOSIS — Z171 Estrogen receptor negative status [ER-]: Secondary | ICD-10-CM

## 2015-06-30 NOTE — Telephone Encounter (Signed)
Gave avs & calendar for October. °

## 2015-06-30 NOTE — Progress Notes (Signed)
Patient Care Team: Antony Blackbird, MD as PCP - General (Family Medicine)  DIAGNOSIS: Breast cancer of upper-inner quadrant of left female breast   Staging form: Breast, AJCC 7th Edition     Clinical: Stage IIIB (T4b, N0, M0) - Signed by Rulon Eisenmenger, MD on 01/17/2015   SUMMARY OF ONCOLOGIC HISTORY:   Breast cancer of upper-inner quadrant of left female breast   12/27/2014 Initial Diagnosis Grade 3 invasive mammary cancer, one lymph node negative in left axilla, ER 0%, PR 0%, Ki-67 98%, HER-2 negative ratio 1.38   01/06/2015 Breast MRI Left breast large enhancing mass, evidence of necrosis, extending from posterior through the middle third of left breast, extending from chest wall to the dermis, close to pectoralis fascia extending 4.5 cm, several bilateral axillary lymph nodes   01/17/2015 - 05/30/2015 Neo-Adjuvant Chemotherapy Neoadjuvant chemotherapy with dose dense Adriamycin and Cytoxan followed by Taxol and carboplatin   01/24/2015 - 01/27/2015 Hospital Admission Neutropenic fever admission, no source identified   06/08/2015 Breast MRI Interval erosion of the known carcinoma within the superior left breast through the overlying skin. The majority of the mass is now exophytic, decrease in Left Axill LN   06/23/2015 Surgery Left simple mastectomy: Invasive ductal carcinoma grade 3; 6.5 cm, lymphovascular invasion, margins negative, 0/3 lymph nodes negative, T4b N0M0 stage IIIB pathologic staging, ER 0%, PR 0%, HER-2 negative, Ki-67 98%    CHIEF COMPLIANT: Follow-up after recent surgery  INTERVAL HISTORY: Patty Buckley is a 64 year old with above-mentioned history of left breast cancer treated with Management chemotherapy and underwent a mastectomy and is here today to discuss the final pathology result. She is in moderate amount of pain from recent surgery but improving slowly over time. She is here to discuss adjuvant treatment plan.  REVIEW OF SYSTEMS:   Constitutional: Denies fevers, chills or  abnormal weight loss Eyes: Denies blurriness of vision Ears, nose, mouth, throat, and face: Denies mucositis or sore throat Respiratory: Denies cough, dyspnea or wheezes Cardiovascular: Denies palpitation, chest discomfort or lower extremity swelling Gastrointestinal:  Denies nausea, heartburn or change in bowel habits Skin: Denies abnormal skin rashes Lymphatics: Denies new lymphadenopathy or easy bruising Neurological:Denies numbness, tingling or new weaknesses Behavioral/Psych: Mood is stable, no new changes  Breast: Pain from recent mastectomy All other systems were reviewed with the patient and are negative.  I have reviewed the past medical history, past surgical history, social history and family history with the patient and they are unchanged from previous note.  ALLERGIES:  is allergic to codeine.  MEDICATIONS:  Current Outpatient Prescriptions  Medication Sig Dispense Refill  . aspirin 81 MG tablet Take 81 mg by mouth daily.      . diphenhydrAMINE (BENADRYL) 25 MG tablet Take 25 mg by mouth every 6 (six) hours as needed for itching.    Marland Kitchen gemfibrozil (LOPID) 600 MG tablet     . HYDROcodone-acetaminophen (NORCO/VICODIN) 5-325 MG per tablet Take 1-2 tablets by mouth every 4 (four) hours as needed for moderate pain. 20 tablet 0  . lisinopril (PRINIVIL,ZESTRIL) 10 MG tablet Take 1 tablet (10 mg total) by mouth daily. Take 10 mg by mouth every morning. 30 tablet 3  . LORazepam (ATIVAN) 0.5 MG tablet Take 1 tablet (0.5 mg total) by mouth every 6 (six) hours as needed (Nausea or vomiting). 30 tablet 0  . metoprolol (LOPRESSOR) 50 MG tablet Take 1 tablet (50 mg total) by mouth 2 (two) times daily. (Patient taking differently: Take 25 mg by mouth 2 (  two) times daily. ) 180 tablet 3  . Multiple Vitamin (MULTIVITAMIN) tablet Take 1 tablet by mouth daily.     . ondansetron (ZOFRAN) 8 MG tablet Take 1 tablet (8 mg total) by mouth 2 (two) times daily as needed. Start on the third day after  chemotherapy. 30 tablet 1   No current facility-administered medications for this visit.    PHYSICAL EXAMINATION: ECOG PERFORMANCE STATUS: 1 - Symptomatic but completely ambulatory  Filed Vitals:   06/30/15 1145  BP: 125/66  Pulse: 89  Temp: 97.8 F (36.6 C)  Resp: 18   Filed Weights   06/30/15 1145  Weight: 154 lb 2 oz (69.911 kg)    GENERAL:alert, no distress and comfortable SKIN: skin color, texture, turgor are normal, no rashes or significant lesions EYES: normal, Conjunctiva are pink and non-injected, sclera clear OROPHARYNX:no exudate, no erythema and lips, buccal mucosa, and tongue normal  NECK: supple, thyroid normal size, non-tender, without nodularity LYMPH:  no palpable lymphadenopathy in the cervical, axillary or inguinal LUNGS: clear to auscultation and percussion with normal breathing effort HEART: regular rate & rhythm and no murmurs and no lower extremity edema ABDOMEN:abdomen soft, non-tender and normal bowel sounds Musculoskeletal:no cyanosis of digits and no clubbing  NEURO: alert & oriented x 3 with fluent speech, no focal motor/sensory deficits  LABORATORY DATA:  I have reviewed the data as listed   Chemistry      Component Value Date/Time   NA 137 06/24/2015 0550   NA 139 06/14/2015 1042   K 4.2 06/24/2015 0550   K 4.4 06/14/2015 1042   CL 106 06/24/2015 0550   CO2 24 06/24/2015 0550   CO2 24 06/14/2015 1042   BUN 7 06/24/2015 0550   BUN 12.4 06/14/2015 1042   CREATININE 0.73 06/24/2015 0550   CREATININE 1.2* 06/14/2015 1042      Component Value Date/Time   CALCIUM 8.3* 06/24/2015 0550   CALCIUM 10.0 06/14/2015 1042   ALKPHOS 58 06/24/2015 0550   ALKPHOS 73 06/14/2015 1042   AST 16 06/24/2015 0550   AST 29 06/14/2015 1042   ALT 18 06/24/2015 0550   ALT 24 06/14/2015 1042   BILITOT 0.2* 06/24/2015 0550   BILITOT 0.67 06/14/2015 1042       Lab Results  Component Value Date   WBC 9.5 06/24/2015   HGB 9.5* 06/24/2015   HCT 29.4*  06/24/2015   MCV 105.8* 06/24/2015   PLT 335 06/24/2015   NEUTROABS 3.8 06/14/2015    ASSESSMENT & PLAN:  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: T4bN?Mx clinical stage IIIB ER 0%, PR 0%, HER-2 negative ratio 1.38, Ki-67 98% S/P Neoadjuvant chemo AC x 12 foll by Taxol- Carbo x 12 weekly completed 05/30/15 MRI Breast 06/08/15 Tumor is now exophytic and decrease in size of LN Chemo toxicites: Neutropenic fever admission, fatigue, elevated AST, anemia Left simple mastectomy 06/23/2015: Invasive ductal carcinoma grade 3; 6.5 cm, lymphovascular invasion, margins negative, 0/3 lymph nodes negative, T4b N0M0 stage IIIB pathologic staging, ER 0%, PR 0%, HER-2 negative, Ki-67 98% ----------------------------------------------------------------------------------------------------------------------------------------------- Pathology review: I discussed the final pathology report and provided a copy of this to the patient. We reviewed the tumor size, lymph node status, margin status, ER and PR receptors status as well as grading.  Recommendation: 1. Adjuvant radiation therapy followed by 2. Adjuvant Xeloda 8 cycles (based on recent results from CREATE-X and FINX-X studies)  CREATE-X study enrolled 910  patient's with HER-2/neu negative breast cancer and residual disease after new adjuvant therapy. 455 patient is assigned to 1250 mg/m times daily 2 weeks on 1 week off  up to 8 cycles, at 5 years PFS 74.1% with Xeloda compared to 67.7% in control arm, 30% reduction in risk, overall survival 89.2% versus 83.9%, hand-foot syndrome was seen in 72.3% with Xeloda grade 3 disease in 10.9%  Return to clinic after radiation therapy  Orders Placed This Encounter  Procedures  . Ambulatory referral to Radiation Oncology    Referral Priority:  Routine    Referral Type:  Consultation    Referral Reason:   Specialty Services Required    Requested Specialty:  Radiation Oncology    Number of Visits Requested:  1   The patient has a good understanding of the overall plan. she agrees with it. she will call with any problems that may develop before the next visit here.   Rulon Eisenmenger, MD

## 2015-07-05 ENCOUNTER — Other Ambulatory Visit: Payer: Self-pay | Admitting: Hematology and Oncology

## 2015-07-05 DIAGNOSIS — C50212 Malignant neoplasm of upper-inner quadrant of left female breast: Secondary | ICD-10-CM

## 2015-07-05 MED ORDER — CAPECITABINE 500 MG PO TABS: 625.0000 mg/m2 | ORAL_TABLET | Freq: Two times a day (BID) | ORAL | Status: DC

## 2015-07-05 NOTE — Progress Notes (Signed)
Location of Breast Cancer:upper-inner quadrant of left breast  Histology per Pathology Report:   Receptor Status: ER(-), PR (-), Her2-neu (neg ratio 1.38)  Did patient present with symptoms (if so, please note symptoms) or was this found on screening mammography?: Noted a large palpable mass late 2015, hadn't had mammogram in 4 years. Mammogram and ultrasound 12/15/14 followed by biopsy 12/27/14.  Past/Anticipated interventions by surgeon, if any: 06/23/15: Left simple mastectomy: Invasive ductal carcinoma, lymphovascular invasion  Past/Anticipated interventions by medical oncology, if any: Neoadjuvant chemotherapy with dose dense Adriamycin and Cytoxan x 12 weekly followed by Taxol and Carboplatin x 12 weekly completed 05/30/15.  Xeloda x 8 cycles.   Lymphedema issues, if any:  no  Pain issues, if any:  No pain to site but pain rating 6/10 to right knuckle.    SAFETY ISSUES:  Prior radiation? no  Pacemaker/ICD? no  Possible current pregnancy?no  Is the patient on methotrexate? no  Current Complaints / other details: Chemo toxicities: Neutropenic fever. CREATE-X Study.  Surgical wound dehiscence to left breast on 07/09/15.  Went surgeon Dr. Georgette Dover the 07/10/15 who packed the wound and put her on antibiotics.      Jenene Slicker, RN 07/05/2015,2:19 PM

## 2015-07-06 ENCOUNTER — Encounter: Payer: Self-pay | Admitting: Hematology and Oncology

## 2015-07-06 NOTE — Telephone Encounter (Signed)
Discussed pt concerns with Dr. Lindi Adie.  Let pt know that there was not any change in her condition or tests that prompted the change in regimen.  Let her know the change was due to feedback from the tumor board and that the xeloda will make the radiation work better.  Reassured patient that Dr. Lindi Adie would not put her on a regimen if he thought she could not handle it.  Advised pt to discuss these concerns further with Dr. Lanell Persons when she sees her for consult on August 3.  Pt voiced understanding.

## 2015-07-12 ENCOUNTER — Ambulatory Visit
Admission: RE | Admit: 2015-07-12 | Discharge: 2015-07-12 | Disposition: A | Discharge status: Home / Self Care | Payer: Medicare Other | Source: Ambulatory Visit | Attending: Radiation Oncology | Admitting: Radiation Oncology

## 2015-07-12 ENCOUNTER — Encounter: Payer: Self-pay | Admitting: Radiation Oncology

## 2015-07-12 VITALS — BP 126/66 | HR 68 | Temp 97.9°F | Resp 12 | Wt 152.2 lb

## 2015-07-12 DIAGNOSIS — C50212 Malignant neoplasm of upper-inner quadrant of left female breast: Secondary | ICD-10-CM | POA: Insufficient documentation

## 2015-07-12 DIAGNOSIS — Z510 Encounter for antineoplastic radiation therapy: Secondary | ICD-10-CM | POA: Insufficient documentation

## 2015-07-12 DIAGNOSIS — E785 Hyperlipidemia, unspecified: Secondary | ICD-10-CM | POA: Diagnosis not present

## 2015-07-12 DIAGNOSIS — I10 Essential (primary) hypertension: Secondary | ICD-10-CM | POA: Insufficient documentation

## 2015-07-12 DIAGNOSIS — E559 Vitamin D deficiency, unspecified: Secondary | ICD-10-CM | POA: Insufficient documentation

## 2015-07-12 DIAGNOSIS — F17210 Nicotine dependence, cigarettes, uncomplicated: Secondary | ICD-10-CM | POA: Diagnosis not present

## 2015-07-12 NOTE — Progress Notes (Signed)
Radiation Oncology         (336) [phone] ________________________________  Initial outpatient Consultation  Name: Patty Buckley MRN: [id number]  Date: 07/12/2015  DOB: [birth year]  ER:XVQM, CAMMIE, MD  Nicholas Lose, MD   REFERRING PHYSICIAN: Nicholas Lose, MD  DIAGNOSIS:    ICD-9-CM ICD-10-CM   1. 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 female who presented with a large palpable mass late 2015, she hadn't had mammogram in 4 years. Mammogram and ultrasound 12/15/14 revealed a Left breast mass 5.6 cm in greatest dimension and a suspicious L axillary node.  This was  followed by biopsy 12/27/14 revealing invasive mammary carcinoma, Grade III ; triple negative (one axillary node was also biopsied and this was negative for malignancy)  . MR of breasts on 01-06-15 revealed a large UIQ left breast mass and 2 suspicious left axillary nodes, and possible skin involvement by cancer.   Staging CT of CAP and bone scan were negative for distant mets in February.  Dr Lindi Adie delivered Neoadjuvant chemotherapy with dose dense Adriamycin and Cytoxan x 12 weekly followed by Taxol and Carboplatin x 12 weekly completed 05/30/15.  Xeloda x 8 cycles.   On 06/23/15: Dr. Georgette Dover performed Left simple mastectomy: revealing a 6.5 cm tumor, Invasive ductal carcinoma with focal metaplastic chondroid differentiation , lymphovascular invasion present, 3 sentinel nodes all negative, margins negative to invasive and in situ disease.  Grossly, the tumor was exophytic and protruding through skin  She is coping with CW scar dehiscence now.  PREVIOUS RADIATION THERAPY: No  PAST MEDICAL HISTORY:  has a past medical history of Hyperlipidemia; Left ventricular outflow obstruction;  Cigarette smoker; Vitamin D deficiency; Osteopenia; Hemorrhoids; Ventricular hypertrophy; Wears glasses; Heart murmur; Hypertension; Inflammation of finger or toe; Hernia; Pneumonia (1980's X ?2); Chronic bronchitis; Cancer of left breast; and Arthritis.    PAST SURGICAL HISTORY: Past Surgical History  Procedure Laterality Date  . Tonsillectomy    . Tubal ligation    . US echocardiography  04/24/2007    EF 55-60%  . Cardiovascular stress test  05/07/2007    EF 82% NO ISCHEMIA  . Wisdom tooth extraction    . Portacath placement Right 01/11/2015    Procedure: INSERTION PORT-A-CATH;  Surgeon: Donnie Mesa, MD;  Location: [facility];  Service: General;  Laterality: Right;     Attempted  failed to implant,   . Colonoscopy    . Mastectomy Left 06/23/2015  . Port-a-cath removal  06/23/2015  . Breast biopsy Left ~ 01/2015  . Peripheral vascular catheterization Right 06/23/2015    Procedure: PORTA CATH REMOVAL;  Surgeon: Donnie Mesa, MD;  Location: [place];  Service: General;  Laterality: Right;  . Total mastectomy Left 06/23/2015    Procedure: LEFT MASTECTOMY;  Surgeon: Donnie Mesa, MD;  Location: [place];  Service: General;  Laterality: Left;    FAMILY HISTORY: family history includes Cancer (age of onset: 77) in her paternal aunt; Heart attack in her mother; Heart failure in her father; Hypertension in her father and mother.  SOCIAL HISTORY:  reports that she has been smoking Cigarettes.  She has a 22.5 pack-year smoking history. She has never used smokeless tobacco. She reports that she drinks about 1.2 oz of alcohol per week. She reports that  she does not use illicit drugs.  ALLERGIES: Codeine  MEDICATIONS:  Current Outpatient Prescriptions  Medication Sig Dispense Refill  . cephALEXin (KEFLEX) 250 MG capsule Take by mouth 4 (four) times daily.    Marland Kitchen aspirin 81 MG tablet Take 81 mg by mouth daily.      . capecitabine (XELODA) 500 MG tablet Take 2 tablets (1,000 mg total) by mouth  2 (two) times daily after a meal. Take it only on days of radiation 144 tablet 0  . diphenhydrAMINE (BENADRYL) 25 MG tablet Take 25 mg by mouth every 6 (six) hours as needed for itching.    Marland Kitchen gemfibrozil (LOPID) 600 MG tablet     . HYDROcodone-acetaminophen (NORCO/VICODIN) 5-325 MG per tablet Take 1-2 tablets by mouth every 4 (four) hours as needed for moderate pain. 20 tablet 0  . lisinopril (PRINIVIL,ZESTRIL) 10 MG tablet Take 1 tablet (10 mg total) by mouth daily. Take 10 mg by mouth every morning. 30 tablet 3  . LORazepam (ATIVAN) 0.5 MG tablet Take 1 tablet (0.5 mg total) by mouth every 6 (six) hours as needed (Nausea or vomiting). 30 tablet 0  . metoprolol (LOPRESSOR) 50 MG tablet Take 1 tablet (50 mg total) by mouth 2 (two) times daily. (Patient taking differently: Take 25 mg by mouth 2 (two) times daily. ) 180 tablet 3  . Multiple Vitamin (MULTIVITAMIN) tablet Take 1 tablet by mouth daily.     . ondansetron (ZOFRAN) 8 MG tablet Take 1 tablet (8 mg total) by mouth 2 (two) times daily as needed. Start on the third day after chemotherapy. 30 tablet 1   No current facility-administered medications for this encounter.    REVIEW OF SYSTEMS:  Notable for that above. Pt reports the incision site (on the left breast) had drainage after the surgery, but she had it cleaned, packed, and was given antibiotics after notifying her physician.   PHYSICAL EXAM:  weight is 152 lb 3.2 oz (69.037 kg). Her oral temperature is 97.9 F (36.6 C). Her blood pressure is 126/66 and her pulse is 68. Her respiration is 12 and oxygen saturation is 98%.   General: Alert and oriented, in no acute distress HEENT: Head is normocephalic. Extraocular movements are intact. Oropharynx is clear. Neck: Neck is supple, no palpable cervical or supraclavicular lymphadenopathy. Heart: Regular in rate and rhythm with no murmurs, rubs, or gallops. Chest: Clear to auscultation bilaterally, with no rhonchi, wheezes, or  rales. Abdomen: Soft, nontender, nondistended, with no rigidity or guarding. Extremities: No cyanosis or edema. Lymphatics: see Neck Exam Skin: No concerning lesions. Musculoskeletal: symmetric strength and muscle tone throughout. Neurologic: Cranial nerves II through XII are grossly intact. No obvious focalities. Speech is fluent. Coordination is intact. Psychiatric: Judgment and insight are intact. Affect is appropriate. Breast: Right breast and axilla are without palpable masses. Left chest wall shows wound dehiscence at the medial chest wall scar. Area was not palpated.  ECOG = 1  0 - Asymptomatic (Fully active, able to carry on all predisease activities without restriction)  1 - Symptomatic but completely ambulatory (Restricted in physically strenuous activity but ambulatory and able to carry out work of a light or sedentary nature. For example, light housework, office work)  2 - Symptomatic, <50% in bed during the day (Ambulatory and capable of all self care but unable to carry out any work activities. Up and about more than 50% of waking hours)  3 - Symptomatic, >50% in bed, but not bedbound (Capable of only limited self-care,  confined to bed or chair 50% or more of waking hours)  4 - Bedbound (Completely disabled. Cannot carry on any self-care. Totally confined to bed or chair)  5 - Death   Eustace Pen MM, Creech RH, Tormey DC, et al. [phone]). "Toxicity and response criteria of the [facility]". [place] Oncol. 5 (6): 649-55   LABORATORY DATA:  Lab Results  Component Value Date   WBC 9.5 06/24/2015   HGB 9.5* 06/24/2015   HCT 29.4* 06/24/2015   MCV 105.8* 06/24/2015   PLT 335 06/24/2015   CMP     Component Value Date/Time   NA 137 06/24/2015 0550   NA 139 06/14/2015 1042   K 4.2 06/24/2015 0550   K 4.4 06/14/2015 1042   CL 106 06/24/2015 0550   CO2 24 06/24/2015 0550   CO2 24 06/14/2015 1042   GLUCOSE 135* 06/24/2015 0550   GLUCOSE 191*  06/14/2015 1042   BUN 7 06/24/2015 0550   BUN 12.4 06/14/2015 1042   CREATININE 0.73 06/24/2015 0550   CREATININE 1.2* 06/14/2015 1042   CALCIUM 8.3* 06/24/2015 0550   CALCIUM 10.0 06/14/2015 1042   PROT 5.6* 06/24/2015 0550   PROT 6.8 06/14/2015 1042   ALBUMIN 2.7* 06/24/2015 0550   ALBUMIN 3.3* 06/14/2015 1042   AST 16 06/24/2015 0550   AST 29 06/14/2015 1042   ALT 18 06/24/2015 0550   ALT 24 06/14/2015 1042   ALKPHOS 58 06/24/2015 0550   ALKPHOS 73 06/14/2015 1042   BILITOT 0.2* 06/24/2015 0550   BILITOT 0.67 06/14/2015 1042   GFRNONAA >60 06/24/2015 0550   GFRAA >60 06/24/2015 0550         RADIOGRAPHY: Nm Sentinel Node Inj-no Rpt (breast)  06/23/2015   CLINICAL DATA: left breast cancer   Sulfur colloid was injected intradermally by the nuclear medicine  technologist for breast cancer sentinel node localization.       IMPRESSION/PLAN: It was a pleasure meeting the patient today. We discussed the risks, benefits, and side effects of radiotherapy which would be directed at the chest wall with the goal of reducing the risk of local recurrence by two-thirds. We discussed that radiation would take approximately 6 weeks to complete and that I would give the patient a few weeks to heal following surgery before starting treatment planning. We spoke about acute effects including skin irritation and fatigue as well as much less common late effects including lung and heart irritation. We spoke about the latest technology that is used to minimize the risk of late effects for breast cancer patients undergoing radiotherapy. No guarantees of treatment were given. The patient is enthusiastic about proceeding with treatment. I look forward to participating in the patient's care.  Consent has been signed today. We will schedule her for CT sim in about 1 month to allow healing. I gave the pt my contact information in case her simulation date needs to be put back further.  The patient continues to  use tobacco. The patient was counseled to stop using tobacco and was offered pharmacotherapy and further counseling to help with this. The patient declined pharmacotherapy and further counseling at this time.  After her consult, nursing will change the patient's wound dressing.      This document serves as a record of services personally performed by Eppie Gibson, MD. It was created on her behalf by Darcus Austin, a trained medical scribe. The creation of this record is based on the scribe's personal observations and the provider's statements  to them. This document has been checked and approved by the attending provider.     __________________________________________   Eppie Gibson, MD

## 2015-07-12 NOTE — Progress Notes (Signed)
Pt here for consult with Dr. Isidore Moos.  Pt has an approx 2  inch wound dehiscence to left breast surgical site.  Pt reports she was suppose to get nursing home care services for wound care, however she hasn't had any visits.  Called nurse Sunday Spillers at Dr. Georgette Dover office.  Received verbal orders of wet to dry dressing to site once a day starting 07/10/15.  Notified Dr. Isidore Moos.  Cleansed site with sterile NS and packed with wet sterile gauze cover with non stick Telfa dressing due to skin irritation from tape, secured with tape.  Site drainage a small amount of yellow drainage.  Dr. Georgette Dover office is going to put in a referral for home health today and will follow up.  Pt notified.

## 2015-08-09 ENCOUNTER — Ambulatory Visit
Admission: RE | Admit: 2015-08-09 | Discharge: 2015-08-09 | Disposition: A | Discharge status: Home / Self Care | Payer: Medicare Other | Source: Ambulatory Visit | Attending: Radiation Oncology | Admitting: Radiation Oncology

## 2015-08-09 DIAGNOSIS — C50212 Malignant neoplasm of upper-inner quadrant of left female breast: Secondary | ICD-10-CM

## 2015-08-09 NOTE — Progress Notes (Signed)
  Radiation Oncology         (336) [phone] ________________________________  Name: Patty Buckley MRN: [id number]  Date: 08/09/2015  DOB: Apr 28, 1951  SIMULATION AND TREATMENT PLANNING NOTE    Outpatient  DIAGNOSIS:     ICD-9-CM ICD-10-CM   1. Breast cancer of upper-inner quadrant of left female breast 174.2 C50.212     NARRATIVE:  The patient was brought to the [facility].  Identity was confirmed.  All relevant records and images related to the planned course of therapy were reviewed.   Patient still has packing in mastectomy scar.  Will delay simulation until released by Dr Georgette Dover.  I informed physician and patient of the rationale for this plan. -----------------------------------  Eppie Gibson, MD

## 2015-08-21 ENCOUNTER — Other Ambulatory Visit: Payer: Self-pay | Admitting: Surgery

## 2015-08-29 ENCOUNTER — Ambulatory Visit (HOSPITAL_BASED_OUTPATIENT_CLINIC_OR_DEPARTMENT_OTHER): Payer: Medicare Other | Admitting: Hematology and Oncology

## 2015-08-29 ENCOUNTER — Encounter: Payer: Self-pay | Admitting: Hematology and Oncology

## 2015-08-29 ENCOUNTER — Telehealth: Payer: Self-pay

## 2015-08-29 VITALS — BP 126/67 | HR 80 | Temp 98.0°F | Resp 18 | Ht 63.0 in | Wt 156.1 lb

## 2015-08-29 DIAGNOSIS — C50212 Malignant neoplasm of upper-inner quadrant of left female breast: Secondary | ICD-10-CM

## 2015-08-29 NOTE — Assessment & Plan Note (Signed)
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: T4bN?Mx clinical stage IIIB ER 0%, PR 0%, HER-2 negative ratio 1.38, Ki-67 98% S/P Neoadjuvant chemo AC x 12 foll by Taxol- Carbo x 12 weekly completed 05/30/15 MRI Breast 06/08/15 Tumor is now exophytic and decrease in size of LN Chemo toxicites: Neutropenic fever admission, fatigue, elevated AST, anemia Left simple mastectomy 06/23/2015: Invasive ductal carcinoma grade 3; 6.5 cm, lymphovascular invasion, margins negative, 0/3 lymph nodes negative, T4b N0M0 stage IIIB pathologic staging, ER 0%, PR 0%, HER-2 negative, Ki-67 98% ----------------------------------------------------------------------------------------------------------------------------------------------- Pathology review: I discussed the final pathology report and provided a copy of this to the patient. We reviewed the tumor size, lymph node status, margin status, ER and PR receptors status as well as grading.  Recommendation: 1. Adjuvant radiation therapy with concurrent Xeloda followed by 2. Adjuvant Xeloda 8 cycles (based on results from CREATE-X and FINX-X studies)  Chest wall recurrence: Status post excision by Dr. Molli Posey on 08/21/2015. I discussed with her that this does not change our treatment plan in terms of adjuvant radiation followed by adjuvant Xeloda. I would like to add Xeloda to the radiation for chemotherapy sensitivity. Patient will take 500 mg twice a day concurrently with radiation with the plan to increase it further if she tolerates it well.  I would like to see her 2 weeks into radiation therapy to recheck her blood counts. If she cannot tolerate Xeloda concurrently with radiation then we will discontinue Xeloda. Patient is very anxious about starting radiation because she is starting to feel significantly better and has recovered from the effects of prior chemotherapy.  Return to clinic 2 weeks after starting radiation

## 2015-08-29 NOTE — Telephone Encounter (Signed)
Per Dr. Lindi Adie, pt needs to be seen.  Appt made for pt at 345.  Pt voiced understanding.   Message     I had a small tissue sample taken by Dr. Georgette Dover. I got the results, yesterday. It was cancer. I have not begun radiation, yet. Will this situation change what I'll do now? I will contact Dr. Isidore Moos today, as well. I'm trying to get a clear view of what my treatment path will be, going forward. Thank you, [name]     [facility] [place] Size     Small Medium Large Extra Extra Large    Patty Buckley  08/29/2015  Patient Email  MRN:  [id number]   Description: 64 year old female  Provider: Nicholas Lose, MD  Department: [facility]          Call Documentation     No notes of this type exist for this encounter.     Encounter MyChart Messages     Read Composed From To Subject    Y 08/29/2015 8:04 AM Patty Kirk, MD Non-Urgent Medical Question      Created by     Nicholas Lose, MD on 08/29/2015 08:04 AM

## 2015-08-29 NOTE — Progress Notes (Signed)
Patient Care Team: Antony Blackbird, MD as PCP - General (Family Medicine)  DIAGNOSIS: Breast cancer of upper-inner quadrant of left female breast   Staging form: Breast, AJCC 7th Edition     Clinical: Stage IIIB (T4b, N0, M0) - Signed by Rulon Eisenmenger, MD on 01/17/2015   SUMMARY OF ONCOLOGIC HISTORY:   Breast cancer of upper-inner quadrant of left female breast   12/27/2014 Initial Diagnosis Grade 3 invasive mammary cancer, one lymph node negative in left axilla, ER 0%, PR 0%, Ki-67 98%, HER-2 negative ratio 1.38   01/06/2015 Breast MRI Left breast large enhancing mass, evidence of necrosis, extending from posterior through the middle third of left breast, extending from chest wall to the dermis, close to pectoralis fascia extending 4.5 cm, several bilateral axillary lymph nodes   01/17/2015 - 05/30/2015 Neo-Adjuvant Chemotherapy Neoadjuvant chemotherapy with dose dense Adriamycin and Cytoxan followed by Taxol and carboplatin   01/24/2015 - 01/27/2015 Hospital Admission Neutropenic fever admission, no source identified   06/08/2015 Breast MRI Interval erosion of the known carcinoma within the superior left breast through the overlying skin. The majority of the mass is now exophytic, decrease in Left Axill LN   06/23/2015 Surgery Left simple mastectomy: Invasive ductal carcinoma grade 3; 6.5 cm, lymphovascular invasion, margins negative, 0/3 lymph nodes negative, T4b N0M0 stage IIIB pathologic staging, ER 0%, PR 0%, HER-2 negative, Ki-67 98%   08/21/2015 Relapse/Recurrence Chest wall recurrence excision positive for breast cancer HER-2 negative    CHIEF COMPLIANT: Chest wall recurrence  INTERVAL HISTORY: PEYTON ROSSNER is a 64 year old with above-mentioned history of very aggressive triple negative left breast cancer treated with neo-adjuvant chemotherapy but still had large amount of residual disease. She was awaiting radiation therapy when she noted a nodule in the left chest wall. Dr.Tsui excised it  which came back as rest cancer. ER/PR pending but HER-2 is negative. She went to sit down and discuss its results with me to see whether this would change any aspect of her treatment. She is recovering very well from the effects of surgery.  REVIEW OF SYSTEMS:   Constitutional: Denies fevers, chills or abnormal weight loss Eyes: Denies blurriness of vision Ears, nose, mouth, throat, and face: Denies mucositis or sore throat Respiratory: Denies cough, dyspnea or wheezes Cardiovascular: Denies palpitation, chest discomfort or lower extremity swelling Gastrointestinal:  Denies nausea, heartburn or change in bowel habits Skin: Denies abnormal skin rashes Lymphatics: Denies new lymphadenopathy or easy bruising Neurological:Denies numbness, tingling or new weaknesses Behavioral/Psych: Mood is stable, no new changes  Breast: Healing from left mastectomy as well as recent excision with 2 stitches present All other systems were reviewed with the patient and are negative.  I have reviewed the past medical history, past surgical history, social history and family history with the patient and they are unchanged from previous note.  ALLERGIES:  is allergic to codeine.  MEDICATIONS:  Current Outpatient Prescriptions  Medication Sig Dispense Refill  . aspirin 81 MG tablet Take 81 mg by mouth daily.      . capecitabine (XELODA) 500 MG tablet Take 2 tablets (1,000 mg total) by mouth 2 (two) times daily after a meal. Take it only on days of radiation 144 tablet 0  . cephALEXin (KEFLEX) 250 MG capsule Take by mouth 4 (four) times daily.    . diphenhydrAMINE (BENADRYL) 25 MG tablet Take 25 mg by mouth every 6 (six) hours as needed for itching.    Marland Kitchen gemfibrozil (LOPID) 600 MG tablet     .  HYDROcodone-acetaminophen (NORCO/VICODIN) 5-325 MG per tablet Take 1-2 tablets by mouth every 4 (four) hours as needed for moderate pain. 20 tablet 0  . lisinopril (PRINIVIL,ZESTRIL) 10 MG tablet Take 1 tablet (10 mg total)  by mouth daily. Take 10 mg by mouth every morning. 30 tablet 3  . LORazepam (ATIVAN) 0.5 MG tablet Take 1 tablet (0.5 mg total) by mouth every 6 (six) hours as needed (Nausea or vomiting). 30 tablet 0  . metoprolol (LOPRESSOR) 50 MG tablet Take 1 tablet (50 mg total) by mouth 2 (two) times daily. (Patient taking differently: Take 25 mg by mouth 2 (two) times daily. ) 180 tablet 3  . Multiple Vitamin (MULTIVITAMIN) tablet Take 1 tablet by mouth daily.     . ondansetron (ZOFRAN) 8 MG tablet Take 1 tablet (8 mg total) by mouth 2 (two) times daily as needed. Start on the third day after chemotherapy. 30 tablet 1   No current facility-administered medications for this visit.    PHYSICAL EXAMINATION: ECOG PERFORMANCE STATUS: 1 - Symptomatic but completely ambulatory  Filed Vitals:   08/29/15 1522  BP: 126/67  Pulse: 80  Temp: 98 F (36.7 C)  Resp: 18   Filed Weights   08/29/15 1522  Weight: 156 lb 1.6 oz (70.806 kg)    GENERAL:alert, no distress and comfortable SKIN: skin color, texture, turgor are normal, no rashes or significant lesions EYES: normal, Conjunctiva are pink and non-injected, sclera clear OROPHARYNX:no exudate, no erythema and lips, buccal mucosa, and tongue normal  NECK: supple, thyroid normal size, non-tender, without nodularity LYMPH:  no palpable lymphadenopathy in the cervical, axillary or inguinal LUNGS: clear to auscultation and percussion with normal breathing effort HEART: regular rate & rhythm and no murmurs and no lower extremity edema ABDOMEN:abdomen soft, non-tender and normal bowel sounds Musculoskeletal:no cyanosis of digits and no clubbing  NEURO: alert & oriented x 3 with fluent speech, no focal motor/sensory deficits  LABORATORY DATA:  I have reviewed the data as listed   Chemistry      Component Value Date/Time   NA 137 06/24/2015 0550   NA 139 06/14/2015 1042   K 4.2 06/24/2015 0550   K 4.4 06/14/2015 1042   CL 106 06/24/2015 0550   CO2 24  06/24/2015 0550   CO2 24 06/14/2015 1042   BUN 7 06/24/2015 0550   BUN 12.4 06/14/2015 1042   CREATININE 0.73 06/24/2015 0550   CREATININE 1.2* 06/14/2015 1042      Component Value Date/Time   CALCIUM 8.3* 06/24/2015 0550   CALCIUM 10.0 06/14/2015 1042   ALKPHOS 58 06/24/2015 0550   ALKPHOS 73 06/14/2015 1042   AST 16 06/24/2015 0550   AST 29 06/14/2015 1042   ALT 18 06/24/2015 0550   ALT 24 06/14/2015 1042   BILITOT 0.2* 06/24/2015 0550   BILITOT 0.67 06/14/2015 1042       Lab Results  Component Value Date   WBC 9.5 06/24/2015   HGB 9.5* 06/24/2015   HCT 29.4* 06/24/2015   MCV 105.8* 06/24/2015   PLT 335 06/24/2015   NEUTROABS 3.8 06/14/2015   ASSESSMENT & PLAN:  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: T4bN?Mx clinical stage IIIB ER 0%, PR 0%, HER-2 negative ratio 1.38, Ki-67 98% S/P Neoadjuvant chemo AC x 12 foll by Taxol- Carbo x 12 weekly completed 05/30/15 MRI Breast 06/08/15 Tumor is now exophytic and decrease in size of LN Chemo toxicites: Neutropenic  fever admission, fatigue, elevated AST, anemia Left simple mastectomy 06/23/2015: Invasive ductal carcinoma grade 3; 6.5 cm, lymphovascular invasion, margins negative, 0/3 lymph nodes negative, T4b N0M0 stage IIIB pathologic staging, ER 0%, PR 0%, HER-2 negative, Ki-67 98% ----------------------------------------------------------------------------------------------------------------------------------------------- Pathology review: I discussed the final pathology report and provided a copy of this to the patient. We reviewed the tumor size, lymph node status, margin status, ER and PR receptors status as well as grading.  Recommendation: 1. Adjuvant radiation therapy with concurrent Xeloda followed by 2. Adjuvant Xeloda 8 cycles (based on results from CREATE-X and FINX-X studies)  Chest wall recurrence: Status post  excision by Dr. Molli Posey on 08/21/2015. I discussed with her that this does not change our treatment plan in terms of adjuvant radiation followed by adjuvant Xeloda. I would like to add Xeloda to the radiation for chemotherapy sensitivity. Patient will take 500 mg twice a day concurrently with radiation with the plan to increase it further if she tolerates it well.  I would like to see her 2 weeks into radiation therapy to recheck her blood counts. If she cannot tolerate Xeloda concurrently with radiation then we will discontinue Xeloda. Patient is very anxious about starting radiation because she is starting to feel significantly better and has recovered from the effects of prior chemotherapy.  Return to clinic 2 weeks after starting radiation   No orders of the defined types were placed in this encounter.   The patient has a good understanding of the overall plan. she agrees with it. she will call with any problems that may develop before the next visit here.   Rulon Eisenmenger, MD

## 2015-08-30 ENCOUNTER — Encounter: Payer: Self-pay | Admitting: Hematology and Oncology

## 2015-09-04 ENCOUNTER — Other Ambulatory Visit: Payer: Self-pay | Admitting: *Deleted

## 2015-09-04 ENCOUNTER — Telehealth: Payer: Self-pay | Admitting: *Deleted

## 2015-09-04 ENCOUNTER — Ambulatory Visit
Admission: RE | Admit: 2015-09-04 | Discharge: 2015-09-04 | Disposition: A | Discharge status: Home / Self Care | Payer: Medicare Other | Source: Ambulatory Visit | Attending: Radiation Oncology | Admitting: Radiation Oncology

## 2015-09-04 DIAGNOSIS — E559 Vitamin D deficiency, unspecified: Secondary | ICD-10-CM | POA: Diagnosis not present

## 2015-09-04 DIAGNOSIS — F17210 Nicotine dependence, cigarettes, uncomplicated: Secondary | ICD-10-CM | POA: Diagnosis not present

## 2015-09-04 DIAGNOSIS — E785 Hyperlipidemia, unspecified: Secondary | ICD-10-CM | POA: Diagnosis not present

## 2015-09-04 DIAGNOSIS — C50212 Malignant neoplasm of upper-inner quadrant of left female breast: Secondary | ICD-10-CM | POA: Diagnosis present

## 2015-09-04 DIAGNOSIS — Z510 Encounter for antineoplastic radiation therapy: Secondary | ICD-10-CM | POA: Diagnosis present

## 2015-09-04 DIAGNOSIS — I10 Essential (primary) hypertension: Secondary | ICD-10-CM | POA: Diagnosis not present

## 2015-09-04 NOTE — Progress Notes (Addendum)
  Radiation Oncology         ([phone]) [phone] ________________________________  Name: Patty Buckley MRN: [id number]  Date: 09/04/2015  DOB: 1951/10/17  SIMULATION AND TREATMENT PLANNING NOTE  And SPECIAL TX PROCEDURE  Outpatient  DIAGNOSIS:     ICD-9-CM ICD-10-CM   1. Breast cancer of upper-inner quadrant of left female breast 174.2 C50.212     NARRATIVE:  The patient was brought to the [facility].  Identity was confirmed.  All relevant records and images related to the planned course of therapy were reviewed.  The patient freely provided informed written consent to proceed with treatment after reviewing the details related to the planned course of therapy. The consent form was witnessed and verified by the simulation staff.    Then, the patient was set-up in a stable reproducible supine position for radiation therapy with her ipsilateral arm over her head, and her upper body secured in a custom-made Vac-lok device.  CT images were obtained.  Surface markings were placed.  The CT images were loaded into the planning software.    TREATMENT PLANNING NOTE: Treatment planning then occurred.  The radiation prescription was entered and confirmed.     A total of 5 medically necessary complex treatment devices were fabricated and supervised by me: 4 fields with MLCs for custom blocks to protect heart, and lungs;  and, a Vac-lok. I have requested : 3D Simulation  I have requested a DVH of the following structures: lungs, heart, esophagus, cord.    The patient will receive 50 Gy in 25 fractions to the left chest wall with 2 tangential fields.  45 Gy in 25 fractions to left SCV and axillary nodes with 2 photon fields. This will  be followed by a boost to the area where a recurrence was recently excised as well as to the chest wall mastectomy scar.  Optical Surface Tracking Plan:  Since intensity modulated radiotherapy (IMRT) and 3D conformal radiation treatment methods are predicated  on accurate and precise positioning for treatment, intrafraction motion monitoring is medically necessary to ensure accurate and safe treatment delivery. The ability to quantify intrafraction motion without excessive ionizing radiation dose can only be performed with optical surface tracking. Accordingly, surface imaging offers the opportunity to obtain 3D measurements of patient position throughout IMRT and 3D treatments without excessive radiation exposure. I am ordering optical surface tracking for this patient's upcoming course of radiotherapy.  ________________________________   Reference:  Ursula Alert, J, et al. Surface imaging-based analysis of intrafraction motion for breast radiotherapy patients.Journal of [place], n. 6, nov. 2014. ISSN [id number].  Available at: <http://www.jacmp.org/index.php/jacmp/article/view/4957>.    Special Treatment Procedure Note: The patient will be receiving chemotherapy concurrently. Chemotherapy heightens the risk of side effects. I have considered this during the patient's treatment planning process and will monitor the patient accordingly for side effects on a weekly basis. Concurrent chemotherapy increases the complexity of this patient's treatment and therefore this constitutes a special treatment procedure.   -----------------------------------  Eppie Gibson, MD

## 2015-09-04 NOTE — Telephone Encounter (Signed)
Verified with [facility] that patient has picked up Xeloda prescription

## 2015-09-06 ENCOUNTER — Ambulatory Visit
Admission: RE | Admit: 2015-09-06 | Discharge: 2015-09-06 | Disposition: A | Discharge status: Home / Self Care | Payer: Medicare Other | Source: Ambulatory Visit | Attending: Radiation Oncology | Admitting: Radiation Oncology

## 2015-09-06 DIAGNOSIS — Z510 Encounter for antineoplastic radiation therapy: Secondary | ICD-10-CM | POA: Diagnosis not present

## 2015-09-06 NOTE — Addendum Note (Signed)
Encounter addended by: Eppie Gibson, MD on: 09/06/2015 11:21 AM<BR>     Documentation filed: Notes Section

## 2015-09-07 ENCOUNTER — Ambulatory Visit
Admission: RE | Admit: 2015-09-07 | Discharge: 2015-09-07 | Disposition: A | Discharge status: Home / Self Care | Payer: Medicare Other | Source: Ambulatory Visit | Attending: Radiation Oncology | Admitting: Radiation Oncology

## 2015-09-07 DIAGNOSIS — Z510 Encounter for antineoplastic radiation therapy: Secondary | ICD-10-CM | POA: Diagnosis not present

## 2015-09-07 DIAGNOSIS — C50212 Malignant neoplasm of upper-inner quadrant of left female breast: Secondary | ICD-10-CM

## 2015-09-07 MED ORDER — ALRA NON-METALLIC DEODORANT (RAD-ONC)
1.0000 "application " | Freq: Once | TOPICAL | Status: AC
  Administered 2015-09-07: 1 via TOPICAL

## 2015-09-07 MED ORDER — RADIAPLEXRX EX GEL
Freq: Once | CUTANEOUS | Status: AC
  Administered 2015-09-07: 14:00:00 via TOPICAL

## 2015-09-07 NOTE — Progress Notes (Signed)
Educated patient regarding fatigue, skin care changes and use of radiaplex and alra deodorant during radiation. Discussed fatigue issues, and importance of rest. Patient was given radiation booklet, skin care changes sheet, and a radiation oncology card with instructions to call if she needed anything or had any questions. She was able to verbalize back to me her instructions.

## 2015-09-08 ENCOUNTER — Ambulatory Visit
Admission: RE | Admit: 2015-09-08 | Discharge: 2015-09-08 | Disposition: A | Discharge status: Home / Self Care | Payer: Medicare Other | Source: Ambulatory Visit | Attending: Radiation Oncology | Admitting: Radiation Oncology

## 2015-09-08 ENCOUNTER — Encounter: Payer: Self-pay | Admitting: Hematology and Oncology

## 2015-09-08 DIAGNOSIS — Z510 Encounter for antineoplastic radiation therapy: Secondary | ICD-10-CM | POA: Diagnosis not present

## 2015-09-11 ENCOUNTER — Ambulatory Visit
Admission: RE | Admit: 2015-09-11 | Discharge: 2015-09-11 | Disposition: A | Discharge status: Home / Self Care | Payer: Medicare Other | Source: Ambulatory Visit | Attending: Radiation Oncology | Admitting: Radiation Oncology

## 2015-09-11 ENCOUNTER — Encounter: Payer: Self-pay | Admitting: Radiation Oncology

## 2015-09-11 VITALS — BP 114/75 | HR 87 | Temp 98.3°F | Ht 63.0 in | Wt 155.0 lb

## 2015-09-11 DIAGNOSIS — I10 Essential (primary) hypertension: Secondary | ICD-10-CM | POA: Diagnosis not present

## 2015-09-11 DIAGNOSIS — C50212 Malignant neoplasm of upper-inner quadrant of left female breast: Secondary | ICD-10-CM | POA: Diagnosis present

## 2015-09-11 DIAGNOSIS — E785 Hyperlipidemia, unspecified: Secondary | ICD-10-CM | POA: Diagnosis not present

## 2015-09-11 DIAGNOSIS — F17210 Nicotine dependence, cigarettes, uncomplicated: Secondary | ICD-10-CM | POA: Diagnosis not present

## 2015-09-11 DIAGNOSIS — Z510 Encounter for antineoplastic radiation therapy: Secondary | ICD-10-CM | POA: Diagnosis present

## 2015-09-11 DIAGNOSIS — E559 Vitamin D deficiency, unspecified: Secondary | ICD-10-CM | POA: Diagnosis not present

## 2015-09-11 NOTE — Progress Notes (Signed)
   Weekly Management Note:  Outpatient    ICD-9-CM ICD-10-CM   1. Breast cancer of upper-inner quadrant of left female breast (HCC) 174.2 C50.212     Current Dose:  6 Gy  Projected Dose: 60 Gy   Narrative:  The patient presents for routine under treatment assessment.  CBCT/MVCT images/Port film x-rays were reviewed.  The chart was checked.  Ms. Cressman reports no fatigue. She does have a small round red area on her anterior left chest wall, that is tender, that she is concerned about. It has become more sensitive since her radiation started. Her skin is otherwise intact with mild redness   Physical Findings:  height is 5\' 3"  (1.6 m) and weight is 155 lb (70.308 kg). Her temperature is 98.3 F (36.8 C). Her blood pressure is 114/75 and her pulse is 87.   Wt Readings from Last 3 Encounters:  09/11/15 155 lb (70.308 kg)  08/29/15 156 lb 1.6 oz (70.806 kg)  07/12/15 152 lb 3.2 oz (69.037 kg)   It appears that there is a raised, erythematous, firm nodule in the area where her recent L chest wall biopsy had excised a recurrence. Concerning for a rapid regrowth of tumor at site of exicsion  Impression:  The patient is tolerating radiotherapy.  Plan:  Continue radiotherapy as planned. If this is a recurrence, she recurrence very quickly after salvage excision.  I recommend continuing RT to full dose.  IF this area does not resolve, further excision could be considered in future. However, stopping radiotherapy to excise this small area again could be detrimental to the efficacy of RT and raise  risk of progression before RT is resumed.  ________________________________   Eppie Gibson, M.D.

## 2015-09-11 NOTE — Progress Notes (Signed)
Ms. Brede is here for her 3rd fraction to her left breast area. She reports no fatigue. She does have a small round red area on her anterior left breast, that is tender, that she is concerned about. It has become more sensitive since her radiation started. Her skin is otherwise intact with mild redness BP 114/75 mmHg  Pulse 87  Temp(Src) 98.3 F (36.8 C)  Ht 5\' 3"  (1.6 m)  Wt 155 lb (70.308 kg)  BMI 27.46 kg/m2  Wt Readings from Last 3 Encounters:  09/11/15 155 lb (70.308 kg)  08/29/15 156 lb 1.6 oz (70.806 kg)  07/12/15 152 lb 3.2 oz (69.037 kg)

## 2015-09-12 ENCOUNTER — Telehealth: Payer: Self-pay | Admitting: *Deleted

## 2015-09-12 ENCOUNTER — Ambulatory Visit
Admission: RE | Admit: 2015-09-12 | Discharge: 2015-09-12 | Disposition: A | Discharge status: Home / Self Care | Payer: Medicare Other | Source: Ambulatory Visit | Attending: Radiation Oncology | Admitting: Radiation Oncology

## 2015-09-12 DIAGNOSIS — Z510 Encounter for antineoplastic radiation therapy: Secondary | ICD-10-CM | POA: Diagnosis not present

## 2015-09-12 NOTE — Telephone Encounter (Signed)
Spoke to pt concerning needs during xrt. Relate doing well and without complaints. Discussed survivorship program and referral at the end of xrt. Encourage pt to call with questions or concerns. Received verbal understanding.

## 2015-09-13 ENCOUNTER — Ambulatory Visit
Admission: RE | Admit: 2015-09-13 | Discharge: 2015-09-13 | Disposition: A | Discharge status: Home / Self Care | Payer: Medicare Other | Source: Ambulatory Visit | Attending: Radiation Oncology | Admitting: Radiation Oncology

## 2015-09-13 DIAGNOSIS — Z510 Encounter for antineoplastic radiation therapy: Secondary | ICD-10-CM | POA: Diagnosis not present

## 2015-09-14 ENCOUNTER — Ambulatory Visit
Admission: RE | Admit: 2015-09-14 | Discharge: 2015-09-14 | Disposition: A | Discharge status: Home / Self Care | Payer: Medicare Other | Source: Ambulatory Visit | Attending: Radiation Oncology | Admitting: Radiation Oncology

## 2015-09-14 DIAGNOSIS — Z510 Encounter for antineoplastic radiation therapy: Secondary | ICD-10-CM | POA: Diagnosis not present

## 2015-09-15 ENCOUNTER — Ambulatory Visit
Admission: RE | Admit: 2015-09-15 | Discharge: 2015-09-15 | Disposition: A | Discharge status: Home / Self Care | Payer: Medicare Other | Source: Ambulatory Visit | Attending: Radiation Oncology | Admitting: Radiation Oncology

## 2015-09-15 DIAGNOSIS — Z510 Encounter for antineoplastic radiation therapy: Secondary | ICD-10-CM | POA: Diagnosis not present

## 2015-09-18 ENCOUNTER — Encounter: Payer: Self-pay | Admitting: Radiation Oncology

## 2015-09-18 ENCOUNTER — Ambulatory Visit
Admission: RE | Admit: 2015-09-18 | Discharge: 2015-09-18 | Disposition: A | Discharge status: Home / Self Care | Payer: Medicare Other | Source: Ambulatory Visit | Attending: Radiation Oncology | Admitting: Radiation Oncology

## 2015-09-18 VITALS — BP 109/66 | HR 82 | Temp 98.1°F | Ht 63.0 in | Wt 156.9 lb

## 2015-09-18 DIAGNOSIS — Z510 Encounter for antineoplastic radiation therapy: Secondary | ICD-10-CM | POA: Diagnosis not present

## 2015-09-18 DIAGNOSIS — C50212 Malignant neoplasm of upper-inner quadrant of left female breast: Secondary | ICD-10-CM

## 2015-09-18 NOTE — Progress Notes (Signed)
Ms. Patty Buckley is here for her 8th fraction of radiation to her Left breast. She reports her energy level is good, and she is eating well. Her left anterior upper chest is red and tender, but denies itching. She is using the radiaplex cream twice a day as needed.  BP 109/66 mmHg  Pulse 82  Temp(Src) 98.1 F (36.7 C)  Ht 5\' 3"  (1.6 m)  Wt 156 lb 14.4 oz (71.169 kg)  BMI 27.80 kg/m2  Wt Readings from Last 3 Encounters:  09/18/15 156 lb 14.4 oz (71.169 kg)  09/11/15 155 lb (70.308 kg)  08/29/15 156 lb 1.6 oz (70.806 kg)

## 2015-09-18 NOTE — Progress Notes (Signed)
   Weekly Management Note:  Outpatient    ICD-9-CM ICD-10-CM   1. Breast cancer of upper-inner quadrant of left female breast (HCC) 174.2 C50.212     Current Dose:  16 Gy  Projected Dose: 60 Gy   Narrative:  The patient presents for routine under treatment assessment.  CBCT/MVCT images/Port film x-rays were reviewed.  The chart was checked. The small round red area on her anterior left chest wall is stable to slightly bigger  Physical Findings:  height is 5\' 3"  (1.6 m) and weight is 156 lb 14.4 oz (71.169 kg). Her temperature is 98.1 F (36.7 C). Her blood pressure is 109/66 and her pulse is 82.   Wt Readings from Last 3 Encounters:  09/18/15 156 lb 14.4 oz (71.169 kg)  09/11/15 155 lb (70.308 kg)  08/29/15 156 lb 1.6 oz (70.806 kg)   Persistent raised, erythematous, firm nodule in the area where her recent L chest wall biopsy had excised a recurrence. Concerning for a rapid regrowth of tumor at site of excision, relatively stable in size. Skin intact over left chest wall  Impression:  The patient is tolerating radiotherapy.  Plan:  Continue radiotherapy as planned. If this is a recurrence, she recurred very quickly after salvage excision.  I recommend continuing RT to full dose. Continue Xeloda ________________________________   Eppie Gibson, M.D.

## 2015-09-19 ENCOUNTER — Ambulatory Visit
Admission: RE | Admit: 2015-09-19 | Discharge: 2015-09-19 | Disposition: A | Discharge status: Home / Self Care | Payer: Medicare Other | Source: Ambulatory Visit | Attending: Radiation Oncology | Admitting: Radiation Oncology

## 2015-09-19 DIAGNOSIS — Z510 Encounter for antineoplastic radiation therapy: Secondary | ICD-10-CM | POA: Diagnosis not present

## 2015-09-20 ENCOUNTER — Ambulatory Visit
Admission: RE | Admit: 2015-09-20 | Discharge: 2015-09-20 | Disposition: A | Discharge status: Home / Self Care | Payer: Medicare Other | Source: Ambulatory Visit | Attending: Radiation Oncology | Admitting: Radiation Oncology

## 2015-09-20 DIAGNOSIS — Z510 Encounter for antineoplastic radiation therapy: Secondary | ICD-10-CM | POA: Diagnosis not present

## 2015-09-21 ENCOUNTER — Ambulatory Visit
Admission: RE | Admit: 2015-09-21 | Discharge: 2015-09-21 | Disposition: A | Discharge status: Home / Self Care | Payer: Medicare Other | Source: Ambulatory Visit | Attending: Radiation Oncology | Admitting: Radiation Oncology

## 2015-09-21 DIAGNOSIS — Z510 Encounter for antineoplastic radiation therapy: Secondary | ICD-10-CM | POA: Diagnosis not present

## 2015-09-22 ENCOUNTER — Ambulatory Visit
Admission: RE | Admit: 2015-09-22 | Discharge: 2015-09-22 | Disposition: A | Discharge status: Home / Self Care | Payer: Medicare Other | Source: Ambulatory Visit | Attending: Radiation Oncology | Admitting: Radiation Oncology

## 2015-09-22 DIAGNOSIS — Z510 Encounter for antineoplastic radiation therapy: Secondary | ICD-10-CM | POA: Diagnosis not present

## 2015-09-25 ENCOUNTER — Ambulatory Visit
Admission: RE | Admit: 2015-09-25 | Discharge: 2015-09-25 | Disposition: A | Discharge status: Home / Self Care | Payer: Medicare Other | Source: Ambulatory Visit | Attending: Radiation Oncology | Admitting: Radiation Oncology

## 2015-09-25 VITALS — BP 95/66 | HR 85 | Temp 98.4°F | Wt 156.3 lb

## 2015-09-25 DIAGNOSIS — Z510 Encounter for antineoplastic radiation therapy: Secondary | ICD-10-CM | POA: Diagnosis not present

## 2015-09-25 DIAGNOSIS — C50212 Malignant neoplasm of upper-inner quadrant of left female breast: Secondary | ICD-10-CM

## 2015-09-25 NOTE — Progress Notes (Signed)
Weekly assessment of radiation to left chestwall.completed 13 treatments.Skin with mild discoloration but no breaks in skin. BP 95/66 mmHg  Pulse 85  Temp(Src) 98.4 F (36.9 C)  Wt 156 lb 4.8 oz (70.897 kg)

## 2015-09-25 NOTE — Progress Notes (Signed)
   Weekly Management Note:  Outpatient    ICD-9-CM ICD-10-CM   1. Breast cancer of upper-inner quadrant of left female breast (HCC) 174.2 C50.212     Current Dose:  26 Gy  Projected Dose: 60 Gy   Narrative:  The patient presents for routine under treatment assessment.  CBCT/MVCT images/Port film x-rays were reviewed.  The chart was checked. No complaints. The small round red area on her anterior left chest wall is stable   Physical Findings:  weight is 156 lb 4.8 oz (70.897 kg). Her temperature is 98.4 F (36.9 C). Her blood pressure is 95/66 and her pulse is 85.   Wt Readings from Last 3 Encounters:  09/25/15 156 lb 4.8 oz (70.897 kg)  09/18/15 156 lb 14.4 oz (71.169 kg)  09/11/15 155 lb (70.308 kg)   Persistent but slightly less raised,  firm nodule in the area where her recent L chest wall biopsy had excised a recurrence. Skin intact and erythematous over left chest wall  Impression:  The patient is tolerating radiotherapy.  Plan:  Continue radiotherapy as planned.   Continue Xeloda ________________________________   Eppie Gibson, M.D.

## 2015-09-26 ENCOUNTER — Ambulatory Visit
Admission: RE | Admit: 2015-09-26 | Discharge: 2015-09-26 | Disposition: A | Discharge status: Home / Self Care | Payer: Medicare Other | Source: Ambulatory Visit | Attending: Radiation Oncology | Admitting: Radiation Oncology

## 2015-09-26 DIAGNOSIS — Z510 Encounter for antineoplastic radiation therapy: Secondary | ICD-10-CM | POA: Diagnosis not present

## 2015-09-27 ENCOUNTER — Ambulatory Visit
Admission: RE | Admit: 2015-09-27 | Discharge: 2015-09-27 | Disposition: A | Discharge status: Home / Self Care | Payer: Medicare Other | Source: Ambulatory Visit | Attending: Radiation Oncology | Admitting: Radiation Oncology

## 2015-09-27 DIAGNOSIS — Z510 Encounter for antineoplastic radiation therapy: Secondary | ICD-10-CM | POA: Diagnosis not present

## 2015-09-28 ENCOUNTER — Ambulatory Visit
Admission: RE | Admit: 2015-09-28 | Discharge: 2015-09-28 | Disposition: A | Discharge status: Home / Self Care | Payer: Medicare Other | Source: Ambulatory Visit | Attending: Radiation Oncology | Admitting: Radiation Oncology

## 2015-09-28 DIAGNOSIS — Z510 Encounter for antineoplastic radiation therapy: Secondary | ICD-10-CM | POA: Diagnosis not present

## 2015-09-28 NOTE — Assessment & Plan Note (Signed)
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: T4bN?Mx clinical stage IIIB ER 0%, PR 0%, HER-2 negative ratio 1.38, Ki-67 98% S/P Neoadjuvant chemo AC x 12 foll by Taxol- Carbo x 12 weekly completed 05/30/15 MRI Breast 06/08/15 Tumor is now exophytic and decrease in size of LN Chemo toxicites: Neutropenic fever admission, fatigue, elevated AST, anemia Left simple mastectomy 06/23/2015: Invasive ductal carcinoma grade 3; 6.5 cm, lymphovascular invasion, margins negative, 0/3 lymph nodes negative, T4b N0M0 stage IIIB pathologic staging, ER 0%, PR 0%, HER-2 negative, Ki-67 98% ----------------------------------------------------------------------------------------------------------------------------------------------- Current treatment: 1. Adjuvant radiation therapy with concurrent Xeloda followed by 2. Adjuvant Xeloda 8 cycles (based on results from CREATE-X and FINX-X studies)  Chest wall recurrence: Status post excision by Dr. Molli Posey on 08/21/2015. Currently she takes Xeloda with radiation for chemotherapy sensitivity. Patient will take 500 mg twice a day concurrently with radiation with the plan to increase it further if she tolerates it well.  I would like to see her 2 weeks into radiation therapy to recheck her blood counts. If she cannot tolerate Xeloda concurrently with radiation then we will discontinue Xeloda.   Return to clinic 2 weeks

## 2015-09-29 ENCOUNTER — Ambulatory Visit
Admission: RE | Admit: 2015-09-29 | Discharge: 2015-09-29 | Disposition: A | Discharge status: Home / Self Care | Payer: Medicare Other | Source: Ambulatory Visit | Attending: Radiation Oncology | Admitting: Radiation Oncology

## 2015-09-29 ENCOUNTER — Ambulatory Visit (HOSPITAL_BASED_OUTPATIENT_CLINIC_OR_DEPARTMENT_OTHER): Payer: Medicare Other | Admitting: Hematology and Oncology

## 2015-09-29 ENCOUNTER — Other Ambulatory Visit (HOSPITAL_BASED_OUTPATIENT_CLINIC_OR_DEPARTMENT_OTHER): Payer: Medicare Other

## 2015-09-29 ENCOUNTER — Telehealth: Payer: Self-pay | Admitting: Hematology and Oncology

## 2015-09-29 ENCOUNTER — Encounter: Payer: Self-pay | Admitting: Hematology and Oncology

## 2015-09-29 VITALS — BP 127/74 | HR 76 | Temp 98.6°F | Resp 18 | Ht 63.0 in | Wt 157.1 lb

## 2015-09-29 DIAGNOSIS — C50812 Malignant neoplasm of overlapping sites of left female breast: Secondary | ICD-10-CM

## 2015-09-29 DIAGNOSIS — Z510 Encounter for antineoplastic radiation therapy: Secondary | ICD-10-CM | POA: Diagnosis not present

## 2015-09-29 DIAGNOSIS — Z853 Personal history of malignant neoplasm of breast: Secondary | ICD-10-CM | POA: Diagnosis not present

## 2015-09-29 DIAGNOSIS — C50212 Malignant neoplasm of upper-inner quadrant of left female breast: Secondary | ICD-10-CM

## 2015-09-29 DIAGNOSIS — Z171 Estrogen receptor negative status [ER-]: Secondary | ICD-10-CM | POA: Diagnosis not present

## 2015-09-29 LAB — CBC WITH DIFFERENTIAL/PLATELET
BASO%: 0.7 % (ref 0.0–2.0)
BASOS ABS: 0 10*3/uL (ref 0.0–0.1)
EOS ABS: 0.1 10*3/uL (ref 0.0–0.5)
EOS%: 2.8 % (ref 0.0–7.0)
HCT: 43.7 % (ref 34.8–46.6)
HGB: 14.6 g/dL (ref 11.6–15.9)
LYMPH%: 23.4 % (ref 14.0–49.7)
MCH: 33.3 pg (ref 25.1–34.0)
MCHC: 33.3 g/dL (ref 31.5–36.0)
MCV: 99.7 fL (ref 79.5–101.0)
MONO#: 0.5 10*3/uL (ref 0.1–0.9)
MONO%: 11.9 % (ref 0.0–14.0)
NEUT#: 2.4 10*3/uL (ref 1.5–6.5)
NEUT%: 61.2 % (ref 38.4–76.8)
Platelets: 205 10*3/uL (ref 145–400)
RBC: 4.38 10*6/uL (ref 3.70–5.45)
RDW: 15.1 % — AB (ref 11.2–14.5)
WBC: 4 10*3/uL (ref 3.9–10.3)
lymph#: 0.9 10*3/uL (ref 0.9–3.3)

## 2015-09-29 LAB — COMPREHENSIVE METABOLIC PANEL (CC13)
ALK PHOS: 75 U/L (ref 40–150)
ALT: 19 U/L (ref 0–55)
AST: 20 U/L (ref 5–34)
Albumin: 3.4 g/dL — ABNORMAL LOW (ref 3.5–5.0)
Anion Gap: 8 mEq/L (ref 3–11)
BUN: 11.7 mg/dL (ref 7.0–26.0)
CALCIUM: 9.6 mg/dL (ref 8.4–10.4)
CO2: 24 mEq/L (ref 22–29)
Chloride: 109 mEq/L (ref 98–109)
Creatinine: 0.8 mg/dL (ref 0.6–1.1)
EGFR: 82 mL/min/{1.73_m2} — ABNORMAL LOW (ref >90)
GLUCOSE: 110 mg/dL (ref 70–140)
POTASSIUM: 4.8 meq/L (ref 3.5–5.1)
SODIUM: 140 meq/L (ref 136–145)
Total Bilirubin: 0.52 mg/dL (ref 0.20–1.20)
Total Protein: 6.3 g/dL — ABNORMAL LOW (ref 6.4–8.3)

## 2015-09-29 NOTE — Addendum Note (Signed)
Addended by: Jonelle Sports K on: 09/29/2015 01:00 PM   Modules accepted: Medications

## 2015-09-29 NOTE — Telephone Encounter (Signed)
Called patient ans she is aware of her appoitments

## 2015-09-29 NOTE — Progress Notes (Signed)
Patient Care Team: Antony Blackbird, MD as PCP - General (Family Medicine)  DIAGNOSIS: Breast cancer of upper-inner quadrant of left female breast [facility])   Staging form: Breast, AJCC 7th Edition     Clinical: Stage IIIB (T4b, N0, M0) - Signed by Rulon Eisenmenger, MD on 01/17/2015   SUMMARY OF ONCOLOGIC HISTORY:   Breast cancer of upper-inner quadrant of left female breast ([place])   12/27/2014 Initial Diagnosis Grade 3 invasive mammary cancer, one lymph node negative in left axilla, ER 0%, PR 0%, Ki-67 98%, HER-2 negative ratio 1.38   01/06/2015 Breast MRI Left breast large enhancing mass, evidence of necrosis, extending from posterior through the middle third of left breast, extending from chest wall to the dermis, close to pectoralis fascia extending 4.5 cm, several bilateral axillary lymph nodes   01/17/2015 - 05/30/2015 Neo-Adjuvant Chemotherapy Neoadjuvant chemotherapy with dose dense Adriamycin and Cytoxan followed by Taxol and carboplatin   01/24/2015 - 01/27/2015 Hospital Admission Neutropenic fever admission, no source identified   06/08/2015 Breast MRI Interval erosion of the known carcinoma within the superior left breast through the overlying skin. The majority of the mass is now exophytic, decrease in Left Axill LN   06/23/2015 Surgery Left simple mastectomy: Invasive ductal carcinoma grade 3; 6.5 cm, lymphovascular invasion, margins negative, 0/3 lymph nodes negative, T4b N0M0 stage IIIB pathologic staging, ER 0%, PR 0%, HER-2 negative, Ki-67 98%   08/21/2015 Relapse/Recurrence Chest wall recurrence excision positive for breast cancer HER-2 negative   09/05/2015 -  Radiation Therapy Radiation with Xeloda 1000 mg by mouth twice a day    CHIEF COMPLIANT: Follow-up on Xeloda  INTERVAL HISTORY: RIVA SESMA is a 64 year old with above-mentioned history of left breast cancer treated with neoadjuvant chemotherapy followed by mastectomy which showed residual disease and she is here today to discuss side  effects of Xeloda. She is currently on radiation therapy with Xeloda 1000 mg by mouth twice a day. She appears to be tolerating this extremely well. She denies any diarrhea or hand-foot syndrome.. Denies any nausea or vomiting. She has mild peripheral neuropathy.  REVIEW OF SYSTEMS:   Constitutional: Denies fevers, chills or abnormal weight loss Eyes: Denies blurriness of vision Ears, nose, mouth, throat, and face: Denies mucositis or sore throat Respiratory: Denies cough, dyspnea or wheezes Cardiovascular: Denies palpitation, chest discomfort or lower extremity swelling Gastrointestinal:  Denies nausea, heartburn or change in bowel habits Skin: Denies abnormal skin rashes Lymphatics: Denies new lymphadenopathy or easy bruising Neurological: Mild peripheral neuropathy in the lower extremities Behavioral/Psych: Mood is stable, no new changes  Breast: Redness and discomfort and increased sensitivity in the breast from radiation and prior surgery All other systems were reviewed with the patient and are negative.  I have reviewed the past medical history, past surgical history, social history and family history with the patient and they are unchanged from previous note.  ALLERGIES:  is allergic to codeine.  MEDICATIONS:  Current Outpatient Prescriptions  Medication Sig Dispense Refill  . aspirin 81 MG tablet Take 81 mg by mouth daily.      . capecitabine (XELODA) 500 MG tablet Take 2 tablets (1,000 mg total) by mouth 2 (two) times daily after a meal. Take it only on days of radiation 144 tablet 0  . diphenhydrAMINE (BENADRYL) 25 MG tablet Take 25 mg by mouth every 6 (six) hours as needed for itching.    . hyaluronate sodium (RADIAPLEXRX) GEL Apply 1 application topically 2 (two) times daily.    Marland Kitchen HYDROcodone-acetaminophen (  NORCO/VICODIN) 5-325 MG per tablet Take 1-2 tablets by mouth every 4 (four) hours as needed for moderate pain. (Patient not taking: Reported on 09/18/2015) 20 tablet 0  .  lisinopril (PRINIVIL,ZESTRIL) 10 MG tablet Take 1 tablet (10 mg total) by mouth daily. Take 10 mg by mouth every morning. 30 tablet 3  . LORazepam (ATIVAN) 0.5 MG tablet Take 1 tablet (0.5 mg total) by mouth every 6 (six) hours as needed (Nausea or vomiting). (Patient not taking: Reported on 09/11/2015) 30 tablet 0  . metoprolol (LOPRESSOR) 50 MG tablet Take 1 tablet (50 mg total) by mouth 2 (two) times daily. (Patient taking differently: Take 25 mg by mouth 2 (two) times daily. ) 180 tablet 3  . Multiple Vitamin (MULTIVITAMIN) tablet Take 1 tablet by mouth daily.     . non-metallic deodorant Jethro Poling) MISC Apply 1 application topically daily as needed.    . ondansetron (ZOFRAN) 8 MG tablet Take 1 tablet (8 mg total) by mouth 2 (two) times daily as needed. Start on the third day after chemotherapy. (Patient not taking: Reported on 09/25/2015) 30 tablet 1   No current facility-administered medications for this visit.    PHYSICAL EXAMINATION: ECOG PERFORMANCE STATUS: 1 - Symptomatic but completely ambulatory  Filed Vitals:   09/29/15 0859  BP: 127/74  Pulse: 76  Temp: 98.6 F (37 C)  Resp: 18   Filed Weights   09/29/15 0859  Weight: 157 lb 1.6 oz (71.26 kg)    GENERAL:alert, no distress and comfortable SKIN: skin color, texture, turgor are normal, no rashes or significant lesions EYES: normal, Conjunctiva are pink and non-injected, sclera clear OROPHARYNX:no exudate, no erythema and lips, buccal mucosa, and tongue normal  NECK: supple, thyroid normal size, non-tender, without nodularity LYMPH:  no palpable lymphadenopathy in the cervical, axillary or inguinal LUNGS: clear to auscultation and percussion with normal breathing effort HEART: regular rate & rhythm and no murmurs and no lower extremity edema ABDOMEN:abdomen soft, non-tender and normal bowel sounds Musculoskeletal:no cyanosis of digits and no clubbing  NEURO: alert & oriented x 3 with fluent speech, grade 1 peripheral  neuropathy lower extremities  LABORATORY DATA:  I have reviewed the data as listed   Chemistry      Component Value Date/Time   NA 137 06/24/2015 0550   NA 139 06/14/2015 1042   K 4.2 06/24/2015 0550   K 4.4 06/14/2015 1042   CL 106 06/24/2015 0550   CO2 24 06/24/2015 0550   CO2 24 06/14/2015 1042   BUN 7 06/24/2015 0550   BUN 12.4 06/14/2015 1042   CREATININE 0.73 06/24/2015 0550   CREATININE 1.2* 06/14/2015 1042      Component Value Date/Time   CALCIUM 8.3* 06/24/2015 0550   CALCIUM 10.0 06/14/2015 1042   ALKPHOS 58 06/24/2015 0550   ALKPHOS 73 06/14/2015 1042   AST 16 06/24/2015 0550   AST 29 06/14/2015 1042   ALT 18 06/24/2015 0550   ALT 24 06/14/2015 1042   BILITOT 0.2* 06/24/2015 0550   BILITOT 0.67 06/14/2015 1042       Lab Results  Component Value Date   WBC 4.0 09/29/2015   HGB 14.6 09/29/2015   HCT 43.7 09/29/2015   MCV 99.7 09/29/2015   PLT 205 09/29/2015   NEUTROABS 2.4 09/29/2015   ASSESSMENT & PLAN:  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: T4bN?Mx clinical stage IIIB ER 0%, PR 0%, HER-2 negative ratio  1.38, Ki-67 98% S/P Neoadjuvant chemo AC x 12 foll by Taxol- Carbo x 12 weekly completed 05/30/15 MRI Breast 06/08/15 Tumor is now exophytic and decrease in size of LN Chemo toxicites: Neutropenic fever admission, fatigue, elevated AST, anemia Left simple mastectomy 06/23/2015: Invasive ductal carcinoma grade 3; 6.5 cm, lymphovascular invasion, margins negative, 0/3 lymph nodes negative, T4b N0M0 stage IIIB pathologic staging, ER 0%, PR 0%, HER-2 negative, Ki-67 98% ----------------------------------------------------------------------------------------------------------------------------------------------- Current treatment: 1. Adjuvant radiation therapy with concurrent Xeloda followed by 2. Adjuvant Xeloda 8 cycles (based on results from CREATE-X and  FINX-X studies)  Chest wall recurrence: Status post excision by Dr. Molli Posey on 08/21/2015. Currently she takes Xeloda with radiation for chemotherapy sensitivity.   Xeloda toxicities: 1000 mg by mouth twice a day concurrently with radiation No major side effects is alert. She denies any diarrhea or hand-foot syndrome. Blood work was reviewed and the CBC looks completely normal. We await CMP. Grade 1 peripheral neuropathy in the lower extremities  I would like to see her 2 weeks to recheck her blood counts.  Return to clinic 2 weeks with labs and follow-up   Orders Placed This Encounter  Procedures  . CBC with Differential    Standing Status: Future     Number of Occurrences:      Standing Expiration Date: 09/28/2016  . Comprehensive metabolic panel (Cmet) - [facility]    Standing Status: Future     Number of Occurrences:      Standing Expiration Date: 09/28/2016   The patient has a good understanding of the overall plan. she agrees with it. she will call with any problems that may develop before the next visit here.   Rulon Eisenmenger, MD 09/29/2015

## 2015-10-02 ENCOUNTER — Encounter: Payer: Self-pay | Admitting: Radiation Oncology

## 2015-10-02 ENCOUNTER — Ambulatory Visit
Admission: RE | Admit: 2015-10-02 | Discharge: 2015-10-02 | Disposition: A | Discharge status: Home / Self Care | Payer: Medicare Other | Source: Ambulatory Visit | Attending: Radiation Oncology | Admitting: Radiation Oncology

## 2015-10-02 VITALS — BP 113/63 | HR 77 | Temp 98.1°F | Wt 159.8 lb

## 2015-10-02 DIAGNOSIS — Z510 Encounter for antineoplastic radiation therapy: Secondary | ICD-10-CM | POA: Diagnosis not present

## 2015-10-02 DIAGNOSIS — C50212 Malignant neoplasm of upper-inner quadrant of left female breast: Secondary | ICD-10-CM

## 2015-10-02 NOTE — Progress Notes (Signed)
Patty Buckley is here for her 18th fraction of radiation to her Left Breast/Left subclavian-axilla area. She reports no fatigue, and is still able to complete her normal activities. Her anterior Left Breast skin is hyperpigmented and red which continues to her axilla. She reports the area will itch on occasion also. She is using her radiaplex cream as directed.   BP 113/63 mmHg  Pulse 77  Temp(Src) 98.1 F (36.7 C)  Wt 159 lb 12.8 oz (72.485 kg)   Wt Readings from Last 3 Encounters:  10/02/15 159 lb 12.8 oz (72.485 kg)  09/29/15 157 lb 1.6 oz (71.26 kg)  09/25/15 156 lb 4.8 oz (70.897 kg)

## 2015-10-02 NOTE — Progress Notes (Signed)
   Weekly Management Note:  Outpatient    ICD-9-CM ICD-10-CM   1. Breast cancer of upper-inner quadrant of left female breast ([facility]) 174.2 C50.212     Current Dose:  36 Gy  Projected Dose: 60 Gy   Narrative:  The patient presents for routine under treatment assessment.  CBCT/MVCT images/Port film x-rays were reviewed.  The chart was checked. No complaints. The nodule on her anterior left chest wall is flatter  Physical Findings:  weight is 159 lb 12.8 oz (72.485 kg). Her temperature is 98.1 F (36.7 C). Her blood pressure is 113/63 and her pulse is 77.   Wt Readings from Last 3 Encounters:  10/02/15 159 lb 12.8 oz (72.485 kg)  09/29/15 157 lb 1.6 oz (71.26 kg)  09/25/15 156 lb 4.8 oz (70.897 kg)   The nodule in the area where her recent L chest wall biopsy was excised is flatter. Skin intact and erythematous/hyperpigmented over left chest wall  Impression:  The patient is tolerating radiotherapy.  Plan:  Continue radiotherapy as planned.   Continue Xeloda ________________________________   Eppie Gibson, M.D.

## 2015-10-03 ENCOUNTER — Ambulatory Visit
Admission: RE | Admit: 2015-10-03 | Discharge: 2015-10-03 | Disposition: A | Discharge status: Home / Self Care | Payer: Medicare Other | Source: Ambulatory Visit | Attending: Radiation Oncology | Admitting: Radiation Oncology

## 2015-10-03 DIAGNOSIS — Z510 Encounter for antineoplastic radiation therapy: Secondary | ICD-10-CM | POA: Diagnosis not present

## 2015-10-04 ENCOUNTER — Ambulatory Visit
Admission: RE | Admit: 2015-10-04 | Discharge: 2015-10-04 | Disposition: A | Discharge status: Home / Self Care | Payer: Medicare Other | Source: Ambulatory Visit | Attending: Radiation Oncology | Admitting: Radiation Oncology

## 2015-10-04 DIAGNOSIS — Z510 Encounter for antineoplastic radiation therapy: Secondary | ICD-10-CM | POA: Diagnosis not present

## 2015-10-05 ENCOUNTER — Ambulatory Visit
Admission: RE | Admit: 2015-10-05 | Discharge: 2015-10-05 | Disposition: A | Discharge status: Home / Self Care | Payer: Medicare Other | Source: Ambulatory Visit | Attending: Radiation Oncology | Admitting: Radiation Oncology

## 2015-10-05 DIAGNOSIS — Z510 Encounter for antineoplastic radiation therapy: Secondary | ICD-10-CM | POA: Diagnosis not present

## 2015-10-06 ENCOUNTER — Ambulatory Visit
Admission: RE | Admit: 2015-10-06 | Discharge: 2015-10-06 | Disposition: A | Discharge status: Home / Self Care | Payer: Medicare Other | Source: Ambulatory Visit | Attending: Radiation Oncology | Admitting: Radiation Oncology

## 2015-10-06 DIAGNOSIS — Z510 Encounter for antineoplastic radiation therapy: Secondary | ICD-10-CM | POA: Diagnosis not present

## 2015-10-09 ENCOUNTER — Ambulatory Visit
Admission: RE | Admit: 2015-10-09 | Discharge: 2015-10-09 | Disposition: A | Discharge status: Home / Self Care | Payer: Medicare Other | Source: Ambulatory Visit | Attending: Radiation Oncology | Admitting: Radiation Oncology

## 2015-10-09 ENCOUNTER — Ambulatory Visit: Payer: Medicare Other | Admitting: Radiation Oncology

## 2015-10-09 ENCOUNTER — Encounter: Payer: Self-pay | Admitting: Radiation Oncology

## 2015-10-09 VITALS — BP 104/69 | HR 74 | Temp 97.6°F | Ht 63.0 in | Wt 157.1 lb

## 2015-10-09 DIAGNOSIS — Z510 Encounter for antineoplastic radiation therapy: Secondary | ICD-10-CM | POA: Diagnosis not present

## 2015-10-09 DIAGNOSIS — C50212 Malignant neoplasm of upper-inner quadrant of left female breast: Secondary | ICD-10-CM

## 2015-10-09 NOTE — Progress Notes (Signed)
Patty Buckley presents for her 23rd fraction of radiation to her Left Breast and Left Subclavian/ Axilla. She denies any fatigue at this time, and reports she is still completing her daily activities as normal. She has hyperpigmentation, and redness to her entire Left anterior breast. She does report dryness, and some mild tenderness to the area. She also reports occasional itching, which she is using the radiaplex cream as directed twice a day.   BP 104/69 mmHg  Pulse 74  Temp(Src) 97.6 F (36.4 C)  Ht 5\' 3"  (1.6 m)  Wt 157 lb 1.6 oz (71.26 kg)  BMI 27.84 kg/m2   Wt Readings from Last 3 Encounters:  10/09/15 157 lb 1.6 oz (71.26 kg)  10/02/15 159 lb 12.8 oz (72.485 kg)  09/29/15 157 lb 1.6 oz (71.26 kg)

## 2015-10-09 NOTE — Progress Notes (Signed)
   Weekly Management Note:  Outpatient    ICD-9-CM ICD-10-CM   1. Breast cancer of upper-inner quadrant of left female breast ([facility]) 174.2 C50.212     Current Dose: 46 Gy  Projected Dose: 66-70 Gy   Narrative:  The patient presents for routine under treatment assessment.  CBCT/MVCT images/Port film x-rays were reviewed.  The chart was checked. No complaints. The nodule on her anterior left chest wall is flattened out.  Skin darker.  Physical Findings:  height is 5\' 3"  (1.6 m) and weight is 157 lb 1.6 oz (71.26 kg). Her temperature is 97.6 F (36.4 C). Her blood pressure is 104/69 and her pulse is 74.   Wt Readings from Last 3 Encounters:  10/09/15 157 lb 1.6 oz (71.26 kg)  10/02/15 159 lb 12.8 oz (72.485 kg)  09/29/15 157 lb 1.6 oz (71.26 kg)   The nodule in the area where her recent L chest wall biopsy was excised is now flat. Skin intact, dry and very erythematous/hyperpigmented over left chest wall  CBC    Component Value Date/Time   WBC 4.0 09/29/2015 0843   WBC 9.5 06/24/2015 0550   RBC 4.38 09/29/2015 0843   RBC 2.78* 06/24/2015 0550   HGB 14.6 09/29/2015 0843   HGB 9.5* 06/24/2015 0550   HCT 43.7 09/29/2015 0843   HCT 29.4* 06/24/2015 0550   PLT 205 09/29/2015 0843   PLT 335 06/24/2015 0550   MCV 99.7 09/29/2015 0843   MCV 105.8* 06/24/2015 0550   MCH 33.3 09/29/2015 0843   MCH 34.2* 06/24/2015 0550   MCHC 33.3 09/29/2015 0843   MCHC 32.3 06/24/2015 0550   RDW 15.1* 09/29/2015 0843   RDW 15.3 06/24/2015 0550   LYMPHSABS 0.9 09/29/2015 0843   LYMPHSABS 1.0 02/07/2015 1210   MONOABS 0.5 09/29/2015 0843   MONOABS 0.0* 02/07/2015 1210   EOSABS 0.1 09/29/2015 0843   EOSABS 0.0 02/07/2015 1210   BASOSABS 0.0 09/29/2015 0843   BASOSABS 0.0 02/07/2015 1210    CMP     Component Value Date/Time   NA 140 09/29/2015 0844   NA 137 06/24/2015 0550   K 4.8 09/29/2015 0844   K 4.2 06/24/2015 0550   CL 106 06/24/2015 0550   CO2 24 09/29/2015 0844   CO2 24 06/24/2015  0550   GLUCOSE 110 09/29/2015 0844   GLUCOSE 135* 06/24/2015 0550   BUN 11.7 09/29/2015 0844   BUN 7 06/24/2015 0550   CREATININE 0.8 09/29/2015 0844   CREATININE 0.73 06/24/2015 0550   CALCIUM 9.6 09/29/2015 0844   CALCIUM 8.3* 06/24/2015 0550   PROT 6.3* 09/29/2015 0844   PROT 5.6* 06/24/2015 0550   ALBUMIN 3.4* 09/29/2015 0844   ALBUMIN 2.7* 06/24/2015 0550   AST 20 09/29/2015 0844   AST 16 06/24/2015 0550   ALT 19 09/29/2015 0844   ALT 18 06/24/2015 0550   ALKPHOS 75 09/29/2015 0844   ALKPHOS 58 06/24/2015 0550   BILITOT 0.52 09/29/2015 0844   BILITOT 0.2* 06/24/2015 0550   GFRNONAA >60 06/24/2015 0550   GFRAA >60 06/24/2015 0550      Impression:  The patient is tolerating radiotherapy.  Plan:  Continue radiotherapy as planned.   Continue Xeloda  Discussed adding one more week to boost area of recurrence to higher dose for local control. She is in agreement ________________________________   Eppie Gibson, M.D.

## 2015-10-10 ENCOUNTER — Ambulatory Visit
Admission: RE | Admit: 2015-10-10 | Discharge: 2015-10-10 | Disposition: A | Discharge status: Home / Self Care | Payer: Medicare Other | Source: Ambulatory Visit | Attending: Radiation Oncology | Admitting: Radiation Oncology

## 2015-10-10 DIAGNOSIS — Z510 Encounter for antineoplastic radiation therapy: Secondary | ICD-10-CM | POA: Diagnosis not present

## 2015-10-10 DIAGNOSIS — C50212 Malignant neoplasm of upper-inner quadrant of left female breast: Secondary | ICD-10-CM | POA: Insufficient documentation

## 2015-10-11 ENCOUNTER — Ambulatory Visit
Admission: RE | Admit: 2015-10-11 | Discharge: 2015-10-11 | Disposition: A | Discharge status: Home / Self Care | Payer: Medicare Other | Source: Ambulatory Visit | Attending: Radiation Oncology | Admitting: Radiation Oncology

## 2015-10-11 ENCOUNTER — Encounter: Payer: Self-pay | Admitting: Radiation Oncology

## 2015-10-11 ENCOUNTER — Ambulatory Visit: Admission: RE | Admit: 2015-10-11 | Payer: Medicare Other | Source: Ambulatory Visit | Admitting: Radiation Oncology

## 2015-10-11 DIAGNOSIS — Z510 Encounter for antineoplastic radiation therapy: Secondary | ICD-10-CM | POA: Diagnosis not present

## 2015-10-12 ENCOUNTER — Encounter: Payer: Self-pay | Admitting: Radiation Oncology

## 2015-10-12 ENCOUNTER — Ambulatory Visit
Admission: RE | Admit: 2015-10-12 | Discharge: 2015-10-12 | Disposition: A | Discharge status: Home / Self Care | Payer: Medicare Other | Source: Ambulatory Visit | Attending: Radiation Oncology | Admitting: Radiation Oncology

## 2015-10-12 DIAGNOSIS — Z510 Encounter for antineoplastic radiation therapy: Secondary | ICD-10-CM | POA: Diagnosis not present

## 2015-10-13 ENCOUNTER — Other Ambulatory Visit (HOSPITAL_BASED_OUTPATIENT_CLINIC_OR_DEPARTMENT_OTHER): Payer: Medicare Other

## 2015-10-13 ENCOUNTER — Telehealth: Payer: Self-pay | Admitting: Hematology and Oncology

## 2015-10-13 ENCOUNTER — Ambulatory Visit
Admission: RE | Admit: 2015-10-13 | Discharge: 2015-10-13 | Disposition: A | Discharge status: Home / Self Care | Payer: Medicare Other | Source: Ambulatory Visit | Attending: Radiation Oncology | Admitting: Radiation Oncology

## 2015-10-13 ENCOUNTER — Encounter: Payer: Self-pay | Admitting: Hematology and Oncology

## 2015-10-13 ENCOUNTER — Ambulatory Visit (HOSPITAL_BASED_OUTPATIENT_CLINIC_OR_DEPARTMENT_OTHER): Payer: Medicare Other | Admitting: Hematology and Oncology

## 2015-10-13 VITALS — BP 109/72 | HR 80 | Temp 97.9°F | Resp 18 | Ht 63.0 in | Wt 157.1 lb

## 2015-10-13 DIAGNOSIS — C50212 Malignant neoplasm of upper-inner quadrant of left female breast: Secondary | ICD-10-CM

## 2015-10-13 DIAGNOSIS — C7989 Secondary malignant neoplasm of other specified sites: Secondary | ICD-10-CM

## 2015-10-13 DIAGNOSIS — Z171 Estrogen receptor negative status [ER-]: Secondary | ICD-10-CM

## 2015-10-13 DIAGNOSIS — Z510 Encounter for antineoplastic radiation therapy: Secondary | ICD-10-CM | POA: Diagnosis not present

## 2015-10-13 LAB — CBC WITH DIFFERENTIAL/PLATELET
BASO%: 0.7 % (ref 0.0–2.0)
Basophils Absolute: 0 10*3/uL (ref 0.0–0.1)
EOS%: 1.9 % (ref 0.0–7.0)
Eosinophils Absolute: 0.1 10*3/uL (ref 0.0–0.5)
HCT: 43 % (ref 34.8–46.6)
HGB: 14.2 g/dL (ref 11.6–15.9)
LYMPH%: 18.5 % (ref 14.0–49.7)
MCH: 33.4 pg (ref 25.1–34.0)
MCHC: 33 g/dL (ref 31.5–36.0)
MCV: 101.2 fL — ABNORMAL HIGH (ref 79.5–101.0)
MONO#: 0.5 10*3/uL (ref 0.1–0.9)
MONO%: 12.1 % (ref 0.0–14.0)
NEUT#: 2.8 10*3/uL (ref 1.5–6.5)
NEUT%: 66.8 % (ref 38.4–76.8)
Platelets: 191 10*3/uL (ref 145–400)
RBC: 4.25 10*6/uL (ref 3.70–5.45)
RDW: 16.3 % — ABNORMAL HIGH (ref 11.2–14.5)
WBC: 4.2 10*3/uL (ref 3.9–10.3)
lymph#: 0.8 10*3/uL — ABNORMAL LOW (ref 0.9–3.3)

## 2015-10-13 LAB — COMPREHENSIVE METABOLIC PANEL (CC13)
ALT: 12 U/L (ref 0–55)
ANION GAP: 9 meq/L (ref 3–11)
AST: 16 U/L (ref 5–34)
Albumin: 3.5 g/dL (ref 3.5–5.0)
Alkaline Phosphatase: 89 U/L (ref 40–150)
BILIRUBIN TOTAL: 0.78 mg/dL (ref 0.20–1.20)
BUN: 12 mg/dL (ref 7.0–26.0)
CALCIUM: 10.1 mg/dL (ref 8.4–10.4)
CO2: 27 meq/L (ref 22–29)
CREATININE: 0.8 mg/dL (ref 0.6–1.1)
Chloride: 105 mEq/L (ref 98–109)
EGFR: 82 mL/min/{1.73_m2} — ABNORMAL LOW (ref >90)
Glucose: 113 mg/dl (ref 70–140)
Potassium: 4.8 mEq/L (ref 3.5–5.1)
Sodium: 140 mEq/L (ref 136–145)
Total Protein: 6.5 g/dL (ref 6.4–8.3)

## 2015-10-13 NOTE — Telephone Encounter (Signed)
Gave and printd appt sched and avs for pt for DEc

## 2015-10-13 NOTE — Assessment & Plan Note (Signed)
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 Adjuvant radiation therapy with concurrent Xeloda followed by  2. Adjuvant Xeloda 8 cycles (based on results from CREATE-X and FINX-X studies)   Chest wall recurrence: Status post excision by Dr. Molli Posey on 08/21/2015. Currently she takes Xeloda with radiation for chemotherapy sensitivity.   Xeloda toxicities: 1000 mg by mouth twice a day concurrently with radiation  No major side effects  Blood work was reviewed and the CBC looks completely normal. We await CMP. Grade 1 peripheral neuropathy in the lower extremities  I would like to see her 2 weeks to recheck her blood counts.  Return to clinic 2 weeks with labs and follow-up

## 2015-10-13 NOTE — Addendum Note (Signed)
Addended by: Prentiss Bells on: 10/13/2015 06:08 PM   Modules accepted: Medications

## 2015-10-13 NOTE — Progress Notes (Signed)
Patient Care Team: Antony Blackbird, MD as PCP - General (Family Medicine)  DIAGNOSIS: Breast cancer of upper-inner quadrant of left female breast [facility])   Staging form: Breast, AJCC 7th Edition     Clinical: Stage IIIB (T4b, N0, M0) - Signed by Rulon Eisenmenger, MD on 01/17/2015   SUMMARY OF ONCOLOGIC HISTORY:   Breast cancer of upper-inner quadrant of left female breast ([place])   12/27/2014 Initial Diagnosis Grade 3 invasive mammary cancer, one lymph node negative in left axilla, ER 0%, PR 0%, Ki-67 98%, HER-2 negative ratio 1.38   01/06/2015 Breast MRI Left breast large enhancing mass, evidence of necrosis, extending from posterior through the middle third of left breast, extending from chest wall to the dermis, close to pectoralis fascia extending 4.5 cm, several bilateral axillary lymph nodes   01/17/2015 - 05/30/2015 Neo-Adjuvant Chemotherapy Neoadjuvant chemotherapy with dose dense Adriamycin and Cytoxan followed by Taxol and carboplatin   01/24/2015 - 01/27/2015 Hospital Admission Neutropenic fever admission, no source identified   06/08/2015 Breast MRI Interval erosion of the known carcinoma within the superior left breast through the overlying skin. The majority of the mass is now exophytic, decrease in Left Axill LN   06/23/2015 Surgery Left simple mastectomy: Invasive ductal carcinoma grade 3; 6.5 cm, lymphovascular invasion, margins negative, 0/3 lymph nodes negative, T4b N0M0 stage IIIB pathologic staging, ER 0%, PR 0%, HER-2 negative, Ki-67 98%   08/21/2015 Relapse/Recurrence Chest wall recurrence excision positive for breast cancer HER-2 negative   09/05/2015 -  Radiation Therapy Radiation with Xeloda 1000 mg by mouth twice a day    CHIEF COMPLIANT: follow-up on Xeloda with radiation  INTERVAL HISTORY: Patty Buckley is a 64 year old with above-mentioned history of very aggressive triple negative left breast cancer who is currently on Xeloda with radiation. She is tolerating Xeloda extremely  well. Denies any diarrhea or hand-foot syndrome. She does have profound fatigue. She is undergoing radiation boost. She will complete her radiation treatment November 14. She is very concerned about the cost of Xeloda prescription. She denies any nausea or vomiting. She had mild peripheral neuropathy from prior chemotherapy.  REVIEW OF SYSTEMS:   Constitutional: Denies fevers, chills or abnormal weight loss, profound fatigue Eyes: Denies blurriness of vision Ears, nose, mouth, throat, and face: Denies mucositis or sore throat Respiratory: Denies cough, dyspnea or wheezes Cardiovascular: Denies palpitation, chest discomfort or lower extremity swelling Gastrointestinal:  Denies nausea, heartburn or change in bowel habits Skin: Denies abnormal skin rashes Lymphatics: Denies new lymphadenopathy or easy bruising Neurological:Denies numbness, tingling or new weaknesses Behavioral/Psych: Mood is stable, no new changes  Breast: Soreness in the breast from prior surgery and concurrent radiation All other systems were reviewed with the patient and are negative.  I have reviewed the past medical history, past surgical history, social history and family history with the patient and they are unchanged from previous note.  ALLERGIES:  is allergic to codeine.  MEDICATIONS:  Current Outpatient Prescriptions  Medication Sig Dispense Refill  . aspirin 81 MG tablet Take 81 mg by mouth daily.      . capecitabine (XELODA) 500 MG tablet Take 2 tablets (1,000 mg total) by mouth 2 (two) times daily after a meal. Take it only on days of radiation 144 tablet 0  . diphenhydrAMINE (BENADRYL) 25 MG tablet Take 25 mg by mouth every 6 (six) hours as needed for itching.    . hyaluronate sodium (RADIAPLEXRX) GEL Apply 1 application topically 2 (two) times daily.    Marland Kitchen  HYDROcodone-acetaminophen (NORCO/VICODIN) 5-325 MG per tablet Take 1-2 tablets by mouth every 4 (four) hours as needed for moderate pain. (Patient not  taking: Reported on 10/02/2015) 20 tablet 0  . lisinopril (PRINIVIL,ZESTRIL) 10 MG tablet Take 1 tablet (10 mg total) by mouth daily. Take 10 mg by mouth every morning. 30 tablet 3  . LORazepam (ATIVAN) 0.5 MG tablet Take 1 tablet (0.5 mg total) by mouth every 6 (six) hours as needed (Nausea or vomiting). (Patient not taking: Reported on 10/02/2015) 30 tablet 0  . metoprolol (LOPRESSOR) 50 MG tablet Take 1 tablet (50 mg total) by mouth 2 (two) times daily. (Patient taking differently: Take 25 mg by mouth 2 (two) times daily. ) 180 tablet 3  . Multiple Vitamin (MULTIVITAMIN) tablet Take 1 tablet by mouth daily.     . non-metallic deodorant Jethro Poling) MISC Apply 1 application topically daily as needed.    . ondansetron (ZOFRAN) 8 MG tablet Take 1 tablet (8 mg total) by mouth 2 (two) times daily as needed. Start on the third day after chemotherapy. (Patient not taking: Reported on 10/02/2015) 30 tablet 1   No current facility-administered medications for this visit.    PHYSICAL EXAMINATION: ECOG PERFORMANCE STATUS: 1 - Symptomatic but completely ambulatory  Filed Vitals:   10/13/15 1131  BP: 109/72  Pulse: 80  Temp: 97.9 F (36.6 C)  Resp: 18   Filed Weights   10/13/15 1131  Weight: 157 lb 1.6 oz (71.26 kg)    GENERAL:alert, no distress and comfortable SKIN: skin color, texture, turgor are normal, no rashes or significant lesions EYES: normal, Conjunctiva are pink and non-injected, sclera clear OROPHARYNX:no exudate, no erythema and lips, buccal mucosa, and tongue normal  NECK: supple, thyroid normal size, non-tender, without nodularity LYMPH:  no palpable lymphadenopathy in the cervical, axillary or inguinal LUNGS: clear to auscultation and percussion with normal breathing effort HEART: regular rate & rhythm and no murmurs and no lower extremity edema ABDOMEN:abdomen soft, non-tender and normal bowel sounds Musculoskeletal:no cyanosis of digits and no clubbing  NEURO: alert & oriented  x 3 with fluent speech, no focal motor/sensory deficits  LABORATORY DATA:  I have reviewed the data as listed   Chemistry      Component Value Date/Time   NA 140 10/13/2015 1112   NA 137 06/24/2015 0550   K 4.8 10/13/2015 1112   K 4.2 06/24/2015 0550   CL 106 06/24/2015 0550   CO2 27 10/13/2015 1112   CO2 24 06/24/2015 0550   BUN 12.0 10/13/2015 1112   BUN 7 06/24/2015 0550   CREATININE 0.8 10/13/2015 1112   CREATININE 0.73 06/24/2015 0550      Component Value Date/Time   CALCIUM 10.1 10/13/2015 1112   CALCIUM 8.3* 06/24/2015 0550   ALKPHOS 89 10/13/2015 1112   ALKPHOS 58 06/24/2015 0550   AST 16 10/13/2015 1112   AST 16 06/24/2015 0550   ALT 12 10/13/2015 1112   ALT 18 06/24/2015 0550   BILITOT 0.78 10/13/2015 1112   BILITOT 0.2* 06/24/2015 0550       Lab Results  Component Value Date   WBC 4.2 10/13/2015   HGB 14.2 10/13/2015   HCT 43.0 10/13/2015   MCV 101.2* 10/13/2015   PLT 191 10/13/2015   NEUTROABS 2.8 10/13/2015   ASSESSMENT & PLAN:  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 Adjuvant radiation therapy with concurrent Xeloda followed by  2. Adjuvant Xeloda 8 cycles (based on results from CREATE-X and FINX-X studies)   Chest wall recurrence: Status post excision by Dr. Molli Posey on 08/21/2015. Currently she takes  Xeloda with radiation for chemotherapy sensitivity.   Xeloda toxicities: 1000 mg by mouth twice a day concurrently with radiation  No major side effects. She will finish radiation on November 14.  Plan: We will start adjuvant Xeloda on December 1st  1500 mg by mouth twice a day 2 weeks on 1 week off. If she tolerates this well, will increase the dosage to 2000 mg twice a day.  Blood work was reviewed and the CBC looks completely normal. CMP is also normal Grade 1 peripheral neuropathy in the lower extremities  I would like to see her December 8 one week after starting the 1500 twice a day dose.   No orders of the defined types were placed in this encounter.   The patient has a good understanding of the overall plan. she agrees with it. she will call with any problems that may develop before the next visit here.   Rulon Eisenmenger, MD 10/13/2015

## 2015-10-16 ENCOUNTER — Ambulatory Visit
Admission: RE | Admit: 2015-10-16 | Discharge: 2015-10-16 | Disposition: A | Discharge status: Home / Self Care | Payer: Medicare Other | Source: Ambulatory Visit | Attending: Radiation Oncology | Admitting: Radiation Oncology

## 2015-10-16 VITALS — BP 100/64 | HR 84 | Temp 97.7°F | Ht 63.0 in | Wt 158.4 lb

## 2015-10-16 DIAGNOSIS — Z510 Encounter for antineoplastic radiation therapy: Secondary | ICD-10-CM | POA: Diagnosis not present

## 2015-10-16 DIAGNOSIS — C50212 Malignant neoplasm of upper-inner quadrant of left female breast: Secondary | ICD-10-CM

## 2015-10-16 NOTE — Progress Notes (Signed)
Patty Buckley presents for her 28/33 radiation treatments to her Left Breast, Left subclavian/axilla area. She reports fatigue, and spends time on the couch watching TV during the day. Her skin is hyperpigmented over her entire Left breast area. It is red and tender to touch at her last rib bone area which she rates at a 2/10. Her axilla area is peeling, and the area in front of her axilla towards her breast area,  which she just observed today while in the shower. She denies any drainage observed at these areas. She is using her radiaplex as directed.   BP 100/64 mmHg  Pulse 84  Temp(Src) 97.7 F (36.5 C)  Ht 5\' 3"  (1.6 m)  Wt 158 lb 6.4 oz (71.85 kg)  BMI 28.07 kg/m2

## 2015-10-16 NOTE — Progress Notes (Signed)
Weekly Management Note:  Outpatient    ICD-9-CM ICD-10-CM   1. Breast cancer of upper-inner quadrant of left female breast ([facility]) 174.2 C50.212     Current Dose: 56 Gy  Projected Dose: 66 Gy   Narrative:  The patient presents for routine under treatment assessment.  CBCT/MVCT images/Port film x-rays were reviewed.  The chart was checked. Moist desquamation in left axilla, new today. Denies drainage.  Physical Findings:  height is 5\' 3"  (1.6 m) and weight is 158 lb 6.4 oz (71.85 kg). Her temperature is 97.7 F (36.5 C). Her blood pressure is 100/64 and her pulse is 84.   Wt Readings from Last 3 Encounters:  10/16/15 158 lb 6.4 oz (71.85 kg)  10/13/15 157 lb 1.6 oz (71.26 kg)  10/09/15 157 lb 1.6 oz (71.26 kg)   Diffuse hyperpigmentation over left chest wall.  Moist desquamation in left axilla/ UOQ skin fold.   No drainage or sign of infection.  CBC    Component Value Date/Time   WBC 4.2 10/13/2015 1112   WBC 9.5 06/24/2015 0550   RBC 4.25 10/13/2015 1112   RBC 2.78* 06/24/2015 0550   HGB 14.2 10/13/2015 1112   HGB 9.5* 06/24/2015 0550   HCT 43.0 10/13/2015 1112   HCT 29.4* 06/24/2015 0550   PLT 191 10/13/2015 1112   PLT 335 06/24/2015 0550   MCV 101.2* 10/13/2015 1112   MCV 105.8* 06/24/2015 0550   MCH 33.4 10/13/2015 1112   MCH 34.2* 06/24/2015 0550   MCHC 33.0 10/13/2015 1112   MCHC 32.3 06/24/2015 0550   RDW 16.3* 10/13/2015 1112   RDW 15.3 06/24/2015 0550   LYMPHSABS 0.8* 10/13/2015 1112   LYMPHSABS 1.0 02/07/2015 1210   MONOABS 0.5 10/13/2015 1112   MONOABS 0.0* 02/07/2015 1210   EOSABS 0.1 10/13/2015 1112   EOSABS 0.0 02/07/2015 1210   BASOSABS 0.0 10/13/2015 1112   BASOSABS 0.0 02/07/2015 1210    CMP     Component Value Date/Time   NA 140 10/13/2015 1112   NA 137 06/24/2015 0550   K 4.8 10/13/2015 1112   K 4.2 06/24/2015 0550   CL 106 06/24/2015 0550   CO2 27 10/13/2015 1112   CO2 24 06/24/2015 0550   GLUCOSE 113 10/13/2015 1112   GLUCOSE 135*  06/24/2015 0550   BUN 12.0 10/13/2015 1112   BUN 7 06/24/2015 0550   CREATININE 0.8 10/13/2015 1112   CREATININE 0.73 06/24/2015 0550   CALCIUM 10.1 10/13/2015 1112   CALCIUM 8.3* 06/24/2015 0550   PROT 6.5 10/13/2015 1112   PROT 5.6* 06/24/2015 0550   ALBUMIN 3.5 10/13/2015 1112   ALBUMIN 2.7* 06/24/2015 0550   AST 16 10/13/2015 1112   AST 16 06/24/2015 0550   ALT 12 10/13/2015 1112   ALT 18 06/24/2015 0550   ALKPHOS 89 10/13/2015 1112   ALKPHOS 58 06/24/2015 0550   BILITOT 0.78 10/13/2015 1112   BILITOT 0.2* 06/24/2015 0550   GFRNONAA >60 06/24/2015 0550   GFRAA >60 06/24/2015 0550      Impression:  The patient is tolerating radiotherapy.  Plan:  Continue radiotherapy as planned.   Continue Xeloda     Apply neosporin to areas of moist desquamation. Radiaplex in dry areas.  Harlow Asa, RN will evaluate patient on Friday before the weekend to check skin. Consider Silvadene if moist desquamation is worse. On of my partners may Rx if needed as I will be out of office that day. ________________________________   Eppie Gibson, M.D.

## 2015-10-17 ENCOUNTER — Ambulatory Visit
Admission: RE | Admit: 2015-10-17 | Discharge: 2015-10-17 | Disposition: A | Discharge status: Home / Self Care | Payer: Medicare Other | Source: Ambulatory Visit | Attending: Radiation Oncology | Admitting: Radiation Oncology

## 2015-10-17 DIAGNOSIS — Z510 Encounter for antineoplastic radiation therapy: Secondary | ICD-10-CM | POA: Diagnosis not present

## 2015-10-18 ENCOUNTER — Encounter: Payer: Self-pay | Admitting: Hematology and Oncology

## 2015-10-18 ENCOUNTER — Other Ambulatory Visit: Payer: Self-pay

## 2015-10-18 ENCOUNTER — Ambulatory Visit: Payer: Medicare Other

## 2015-10-18 ENCOUNTER — Ambulatory Visit
Admission: RE | Admit: 2015-10-18 | Discharge: 2015-10-18 | Disposition: A | Discharge status: Home / Self Care | Payer: Medicare Other | Source: Ambulatory Visit | Attending: Radiation Oncology | Admitting: Radiation Oncology

## 2015-10-18 DIAGNOSIS — Z510 Encounter for antineoplastic radiation therapy: Secondary | ICD-10-CM | POA: Diagnosis not present

## 2015-10-18 MED ORDER — HYDROCODONE-ACETAMINOPHEN 5-325 MG PO TABS: 1.0000 | ORAL_TABLET | ORAL | Status: DC | PRN

## 2015-10-18 NOTE — Progress Notes (Signed)
LMOVM for pt - Rx would be in book for pickup tomorrow.

## 2015-10-18 NOTE — Telephone Encounter (Signed)
rx re-printed to correct qty

## 2015-10-19 ENCOUNTER — Ambulatory Visit
Admission: RE | Admit: 2015-10-19 | Discharge: 2015-10-19 | Disposition: A | Discharge status: Home / Self Care | Payer: Medicare Other | Source: Ambulatory Visit | Attending: Radiation Oncology | Admitting: Radiation Oncology

## 2015-10-19 DIAGNOSIS — Z510 Encounter for antineoplastic radiation therapy: Secondary | ICD-10-CM | POA: Diagnosis not present

## 2015-10-20 ENCOUNTER — Ambulatory Visit
Admission: RE | Admit: 2015-10-20 | Discharge: 2015-10-20 | Disposition: A | Discharge status: Home / Self Care | Payer: Medicare Other | Source: Ambulatory Visit | Attending: Radiation Oncology | Admitting: Radiation Oncology

## 2015-10-20 DIAGNOSIS — C50212 Malignant neoplasm of upper-inner quadrant of left female breast: Secondary | ICD-10-CM

## 2015-10-20 DIAGNOSIS — Z510 Encounter for antineoplastic radiation therapy: Secondary | ICD-10-CM | POA: Diagnosis not present

## 2015-10-20 MED ORDER — SILVER SULFADIAZINE 1 % EX CREA
TOPICAL_CREAM | Freq: Once | CUTANEOUS | Status: AC
  Administered 2015-10-20: 14:00:00 via TOPICAL

## 2015-10-20 NOTE — Progress Notes (Signed)
Received patient in the clinic following radiation treatment for skin check. Hyperpigmentation of left chest wall noted with dry desquamation. Hyperpigmentation of left axilla with moist desquamation noted. Patient states, "some areas are better and others are worse." Encouraged patient to continue radiaplex to left chest wall. Provided patient with SSD and directed upon use. Encouraged patient to use SSD only to moist desquamation of left axilla. Explained she should use only a fine thin layer twice per day for two weeks max. Demonstrated use for patient. Patient verbalized understanding.

## 2015-10-23 ENCOUNTER — Encounter: Payer: Self-pay | Admitting: Radiation Oncology

## 2015-10-23 ENCOUNTER — Ambulatory Visit
Admission: RE | Admit: 2015-10-23 | Discharge: 2015-10-23 | Disposition: A | Discharge status: Home / Self Care | Payer: Medicare Other | Source: Ambulatory Visit | Attending: Radiation Oncology | Admitting: Radiation Oncology

## 2015-10-23 VITALS — BP 107/63 | HR 86 | Temp 98.0°F | Ht 63.0 in | Wt 156.5 lb

## 2015-10-23 DIAGNOSIS — Z510 Encounter for antineoplastic radiation therapy: Secondary | ICD-10-CM | POA: Diagnosis not present

## 2015-10-23 DIAGNOSIS — C50212 Malignant neoplasm of upper-inner quadrant of left female breast: Secondary | ICD-10-CM

## 2015-10-23 MED ORDER — HYDROCODONE-ACETAMINOPHEN 5-325 MG PO TABS: 1.0000 | ORAL_TABLET | ORAL | Status: DC | PRN

## 2015-10-23 NOTE — Progress Notes (Signed)
Patty Buckley is here for her last treatment of radiation to her Left Breast, Subclavian/Axilla. She reports some fatigue, she lays down but is unable to nap. She is "up and down" at night because of the position she sleeps in related to the pain in her Left Breast region which today she rates a 3/10. She takes a norco at bedtime only, to help her pain. Her anterior breast is discolored and red. She has several areas of moist desquamation to her axilla and upper Left quadrant of her breast. There is scant amount of bloody drainage noted which appeared yesterday and she put neosporin on. She is using silvadene cream to her axilla area. She has not noticed any improvement in her skin yet, but has only been using the cream two days.   BP 107/63 mmHg  Pulse 86  Temp(Src) 98 F (36.7 C)  Ht 5\' 3"  (1.6 m)  Wt 156 lb 8 oz (70.988 kg)  BMI 27.73 kg/m2

## 2015-10-23 NOTE — Progress Notes (Signed)
   Weekly Management Note:  Outpatient    ICD-9-CM ICD-10-CM   1. Breast cancer of upper-inner quadrant of left female breast ([facility]) 174.2 C50.212     Current Dose: 66 Gy  Projected Dose: 66 Gy   Narrative:  The patient presents for routine under treatment assessment.  CBCT/MVCT images/Port film x-rays were reviewed.  The chart was checked. Moist desquamation in left axilla, recently started Silvadene  Physical Findings:  vitals were not taken for this visit.  Wt Readings from Last 3 Encounters:  10/23/15 156 lb 8 oz (70.988 kg)  10/16/15 158 lb 6.4 oz (71.85 kg)  10/13/15 157 lb 1.6 oz (71.26 kg)   Diffuse hyperpigmentation /dry desquamation over left chest wall.  Moist desquamation in left axilla/ UOQ skin fold.   No sign of infection.  CBC    Component Value Date/Time   WBC 4.2 10/13/2015 1112   WBC 9.5 06/24/2015 0550   RBC 4.25 10/13/2015 1112   RBC 2.78* 06/24/2015 0550   HGB 14.2 10/13/2015 1112   HGB 9.5* 06/24/2015 0550   HCT 43.0 10/13/2015 1112   HCT 29.4* 06/24/2015 0550   PLT 191 10/13/2015 1112   PLT 335 06/24/2015 0550   MCV 101.2* 10/13/2015 1112   MCV 105.8* 06/24/2015 0550   MCH 33.4 10/13/2015 1112   MCH 34.2* 06/24/2015 0550   MCHC 33.0 10/13/2015 1112   MCHC 32.3 06/24/2015 0550   RDW 16.3* 10/13/2015 1112   RDW 15.3 06/24/2015 0550   LYMPHSABS 0.8* 10/13/2015 1112   LYMPHSABS 1.0 02/07/2015 1210   MONOABS 0.5 10/13/2015 1112   MONOABS 0.0* 02/07/2015 1210   EOSABS 0.1 10/13/2015 1112   EOSABS 0.0 02/07/2015 1210   BASOSABS 0.0 10/13/2015 1112   BASOSABS 0.0 02/07/2015 1210    CMP     Component Value Date/Time   NA 140 10/13/2015 1112   NA 137 06/24/2015 0550   K 4.8 10/13/2015 1112   K 4.2 06/24/2015 0550   CL 106 06/24/2015 0550   CO2 27 10/13/2015 1112   CO2 24 06/24/2015 0550   GLUCOSE 113 10/13/2015 1112   GLUCOSE 135* 06/24/2015 0550   BUN 12.0 10/13/2015 1112   BUN 7 06/24/2015 0550   CREATININE 0.8 10/13/2015 1112   CREATININE 0.73 06/24/2015 0550   CALCIUM 10.1 10/13/2015 1112   CALCIUM 8.3* 06/24/2015 0550   PROT 6.5 10/13/2015 1112   PROT 5.6* 06/24/2015 0550   ALBUMIN 3.5 10/13/2015 1112   ALBUMIN 2.7* 06/24/2015 0550   AST 16 10/13/2015 1112   AST 16 06/24/2015 0550   ALT 12 10/13/2015 1112   ALT 18 06/24/2015 0550   ALKPHOS 89 10/13/2015 1112   ALKPHOS 58 06/24/2015 0550   BILITOT 0.78 10/13/2015 1112   BILITOT 0.2* 06/24/2015 0550   GFRNONAA >60 06/24/2015 0550   GFRAA >60 06/24/2015 0550      Impression:  The patient has tolerated radiotherapy.  Plan:   Silvadene for moist desquamation . F/u in 41mo, sooner if needed. ________________________________   Eppie Gibson, M.D.

## 2015-10-24 ENCOUNTER — Telehealth: Payer: Self-pay | Admitting: *Deleted

## 2015-10-24 NOTE — Telephone Encounter (Signed)
Called pt to congratulate on completion of xrt. Relate doing well and without complaints. Discussed survivorship program and referral now that xrt is complete. Pt requested to meeting with SW to complete advanced directions. Referral placed. Denies further needs at this time. Encourage pt to call with questions or concerns.

## 2015-10-26 ENCOUNTER — Other Ambulatory Visit: Payer: Self-pay | Admitting: Adult Health

## 2015-10-26 DIAGNOSIS — C50212 Malignant neoplasm of upper-inner quadrant of left female breast: Secondary | ICD-10-CM

## 2015-10-27 ENCOUNTER — Encounter: Payer: Self-pay | Admitting: Radiation Oncology

## 2015-10-27 NOTE — Progress Notes (Signed)
Electron [employer] technologist and Treatment Planning Note 10-12-15  Outpatient  Diagnosis: Breast Cancer   The patient's CT images from her initial simulation were reviewed to plan her boost treatment to her Left chest wall - to give further dose to an area of gross disease.  Measurements were made regarding the size and depth of the surgical bed. The boost  will be delivered with 6 MeV electrons with 0.5cm bolus; 6 Gy in 3 fractions has been prescribed to the 100% isodose line.   A electron [employer] plan was reviewed and approved.  A custom electron cut-out will be used for her boost field.    -----------------------------------  Eppie Gibson, MD

## 2015-10-27 NOTE — Progress Notes (Signed)
Electron [employer] representative Note 10-11-15  Outpatient  Diagnosis: Breast Cancer   The patient's CT images from her initial simulation were reviewed to plan her boost treatment to her Left chest wall.  Measurements were made regarding the size and depth of the surgical bed. The boost  will be delivered with 6 MeV electrons with 0.5cm bolus; 10 Gy in 5 fractions has been prescribed to the 100% isodose line.   A electron [employer] plan was reviewed and approved.  A custom electron cut-out will be used for her boost field.    -----------------------------------  Eppie Gibson, MD

## 2015-10-27 NOTE — Progress Notes (Signed)
  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 Left Chest Wall/ 50 Gy in 25 fractions.         2. Left Chest Wall Scar Boost/ 10 Gy in 5 fractions.         3. Left Mid Axilla and SCLAV/ 45 Gy in 25 fractions.         4. Left Chest Wall Boost #2/ 6 Gy in 3 fractions.  Beams/energy: 1. 3D-Conformal Other // 6X       2. Electron [employer] // 6 MeV       3. 2 Field // 6X       4. Electron [employer] // 6 MeV  Narrative: The patient tolerated radiation treatment relatively well. Highest Total dose: 66Gy /33 fractions to nodular area suspicious for recurrence disease after salvage resection  Plan: The patient has completed radiation treatment. The patient will return to radiation oncology clinic for routine followup in one month. I advised them to call or return sooner if they have any questions or concerns related to their recovery or treatment.  -----------------------------------  Eppie Gibson, MD  This document serves as a record of services personally performed by Eppie Gibson, MD. It was created on her behalf by Darcus Austin, a trained medical scribe. The creation of this record is based on the scribe's personal observations and the provider's statements to them. This document has been checked and approved by the attending provider.

## 2015-10-31 ENCOUNTER — Telehealth: Payer: Self-pay | Admitting: Nurse Practitioner

## 2015-10-31 NOTE — Telephone Encounter (Signed)
Spoke with patient re SCP visit for 12/21/15. Other appointments remain the same.

## 2015-11-06 ENCOUNTER — Other Ambulatory Visit: Payer: Self-pay | Admitting: Hematology and Oncology

## 2015-11-06 NOTE — Telephone Encounter (Signed)
Chart reviewed.

## 2015-11-15 NOTE — Assessment & Plan Note (Signed)
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 Adjuvant radiation therapy with concurrent Xeloda followed by  2. Adjuvant Xeloda 8 cycles (based on results from CREATE-X and FINX-X studies)   Chest wall recurrence: Status post excision by Dr. Molli Posey on 08/21/2015. Currently she takes Xeloda with radiation for chemotherapy sensitivity.   Xeloda toxicities: 1000 mg by mouth twice a day concurrently with radiation  No major side effects. She will finish radiation on November 14.  Plan: We will start adjuvant Xeloda on December 1st 1500 mg by mouth twice a day 2 weeks on 1 week off. If she tolerates this well, will increase the dosage to 2000 mg twice a day.  Blood work was reviewed and the CBC looks completely normal. CMP is also normal Grade 1 peripheral neuropathy in the lower extremities  I would like to see her in 4 weeks on 1500 twice a day dose.

## 2015-11-16 ENCOUNTER — Ambulatory Visit (HOSPITAL_BASED_OUTPATIENT_CLINIC_OR_DEPARTMENT_OTHER): Payer: Medicare Other | Admitting: Hematology and Oncology

## 2015-11-16 ENCOUNTER — Encounter: Payer: Self-pay | Admitting: Cardiovascular Disease

## 2015-11-16 ENCOUNTER — Other Ambulatory Visit (HOSPITAL_BASED_OUTPATIENT_CLINIC_OR_DEPARTMENT_OTHER): Payer: Medicare Other

## 2015-11-16 ENCOUNTER — Telehealth: Payer: Self-pay | Admitting: Hematology and Oncology

## 2015-11-16 ENCOUNTER — Encounter: Payer: Self-pay | Admitting: Hematology and Oncology

## 2015-11-16 ENCOUNTER — Telehealth: Payer: Self-pay

## 2015-11-16 ENCOUNTER — Ambulatory Visit (INDEPENDENT_AMBULATORY_CARE_PROVIDER_SITE_OTHER): Payer: Medicare Other | Admitting: Cardiovascular Disease

## 2015-11-16 VITALS — BP 98/60 | HR 81 | Ht 63.0 in | Wt 158.8 lb

## 2015-11-16 DIAGNOSIS — Z171 Estrogen receptor negative status [ER-]: Secondary | ICD-10-CM | POA: Diagnosis not present

## 2015-11-16 DIAGNOSIS — C50212 Malignant neoplasm of upper-inner quadrant of left female breast: Secondary | ICD-10-CM

## 2015-11-16 DIAGNOSIS — I421 Obstructive hypertrophic cardiomyopathy: Secondary | ICD-10-CM

## 2015-11-16 DIAGNOSIS — C7989 Secondary malignant neoplasm of other specified sites: Secondary | ICD-10-CM | POA: Diagnosis not present

## 2015-11-16 DIAGNOSIS — I10 Essential (primary) hypertension: Secondary | ICD-10-CM

## 2015-11-16 DIAGNOSIS — E785 Hyperlipidemia, unspecified: Secondary | ICD-10-CM | POA: Diagnosis not present

## 2015-11-16 LAB — CBC WITH DIFFERENTIAL/PLATELET
BASO%: 0.5 % (ref 0.0–2.0)
Basophils Absolute: 0 10*3/uL (ref 0.0–0.1)
EOS ABS: 0.1 10*3/uL (ref 0.0–0.5)
EOS%: 1.7 % (ref 0.0–7.0)
HEMATOCRIT: 39.6 % (ref 34.8–46.6)
HEMOGLOBIN: 13.1 g/dL (ref 11.6–15.9)
LYMPH#: 1 10*3/uL (ref 0.9–3.3)
LYMPH%: 24.8 % (ref 14.0–49.7)
MCH: 35.2 pg — ABNORMAL HIGH (ref 25.1–34.0)
MCHC: 33.1 g/dL (ref 31.5–36.0)
MCV: 106.5 fL — AB (ref 79.5–101.0)
MONO#: 0.5 10*3/uL (ref 0.1–0.9)
MONO%: 12.1 % (ref 0.0–14.0)
NEUT%: 60.9 % (ref 38.4–76.8)
NEUTROS ABS: 2.6 10*3/uL (ref 1.5–6.5)
PLATELETS: 241 10*3/uL (ref 145–400)
RBC: 3.72 10*6/uL (ref 3.70–5.45)
RDW: 16 % — AB (ref 11.2–14.5)
WBC: 4.2 10*3/uL (ref 3.9–10.3)

## 2015-11-16 LAB — COMPREHENSIVE METABOLIC PANEL
ALBUMIN: 3.4 g/dL — AB (ref 3.5–5.0)
ALK PHOS: 84 U/L (ref 40–150)
ALT: 12 U/L (ref 0–55)
ANION GAP: 11 meq/L (ref 3–11)
AST: 20 U/L (ref 5–34)
BILIRUBIN TOTAL: 0.57 mg/dL (ref 0.20–1.20)
BUN: 10.9 mg/dL (ref 7.0–26.0)
CO2: 23 mEq/L (ref 22–29)
CREATININE: 0.8 mg/dL (ref 0.6–1.1)
Calcium: 9.6 mg/dL (ref 8.4–10.4)
Chloride: 107 mEq/L (ref 98–109)
EGFR: 79 mL/min/{1.73_m2} — AB (ref >90)
Glucose: 118 mg/dl (ref 70–140)
Potassium: 4.5 mEq/L (ref 3.5–5.1)
Sodium: 140 mEq/L (ref 136–145)
TOTAL PROTEIN: 6.6 g/dL (ref 6.4–8.3)

## 2015-11-16 MED ORDER — CAPECITABINE 500 MG PO TABS: 2000.0000 mg | ORAL_TABLET | Freq: Two times a day (BID) | ORAL | Status: DC

## 2015-11-16 NOTE — Progress Notes (Signed)
Patient Care Team: Antony Blackbird, MD as PCP - General (Family Medicine)  DIAGNOSIS: Breast cancer of upper-inner quadrant of left female breast [facility])   Staging form: Breast, AJCC 7th Edition     Clinical: Stage IIIB (T4b, N0, M0) - Signed by Rulon Eisenmenger, MD on 01/17/2015   SUMMARY OF ONCOLOGIC HISTORY:   Breast cancer of upper-inner quadrant of left female breast ([place])   12/27/2014 Initial Diagnosis Grade 3 invasive mammary cancer, one lymph node negative in left axilla, ER 0%, PR 0%, Ki-67 98%, HER-2 negative ratio 1.38   01/06/2015 Breast MRI Left breast large enhancing mass, evidence of necrosis, extending from posterior through the middle third of left breast, extending from chest wall to the dermis, close to pectoralis fascia extending 4.5 cm, several bilateral axillary lymph nodes   01/17/2015 - 05/30/2015 Neo-Adjuvant Chemotherapy Neoadjuvant chemotherapy with dose dense Adriamycin and Cytoxan followed by Taxol and carboplatin   01/24/2015 - 01/27/2015 Hospital Admission Neutropenic fever admission, no source identified   06/08/2015 Breast MRI Interval erosion of the known carcinoma within the superior left breast through the overlying skin. The majority of the mass is now exophytic, decrease in Left Axill LN   06/23/2015 Surgery Left simple mastectomy: Invasive ductal carcinoma grade 3; 6.5 cm, lymphovascular invasion, margins negative, 0/3 lymph nodes negative, T4b N0M0 stage IIIB pathologic staging, ER 0%, PR 0%, HER-2 negative, Ki-67 98%   08/21/2015 Relapse/Recurrence Chest wall recurrence excision positive for breast cancer HER-2 negative   09/05/2015 - 10/23/2015 Radiation Therapy Radiation with Xeloda 1000 mg by mouth twice a day   11/09/2015 -  Chemotherapy Xeloda 1500 bid increased to 2000 mg bid starting 11/30/15    CHIEF COMPLIANT: follow-up on Xeloda  INTERVAL HISTORY: Patty Buckley is a 64 year old with above-mentioned history of left breast cancer treated with neoadjuvant  chemotherapy followed by mastectomy and radiation and is currently on Xeloda adjuvant therapy. She has tolerated the 1500 twice a day dose fairly well. She does have some decreased taste. Her energy levels are normal. She denies any diarrhea. Denies any rash on her hands or feet.  REVIEW OF SYSTEMS:   Constitutional: Denies fevers, chills or abnormal weight loss Eyes: Denies blurriness of vision Ears, nose, mouth, throat, and face: Denies mucositis or sore throat Respiratory: Denies cough, dyspnea or wheezes Cardiovascular: Denies palpitation, chest discomfort or lower extremity swelling Gastrointestinal:  Denies nausea, heartburn or change in bowel habits Skin: Denies abnormal skin rashes Lymphatics: Denies new lymphadenopathy or easy bruising Neurological:mild tingling and numbness Behavioral/Psych: Mood is stable, no new changes  All other systems were reviewed with the patient and are negative.  I have reviewed the past medical history, past surgical history, social history and family history with the patient and they are unchanged from previous note.  ALLERGIES:  is allergic to codeine.  MEDICATIONS:  Current Outpatient Prescriptions  Medication Sig Dispense Refill  . aspirin 81 MG tablet Take 81 mg by mouth daily.      Derrill Memo ON 11/30/2015] capecitabine (XELODA) 500 MG tablet Take 4 tablets (2,000 mg total) by mouth 2 (two) times daily after a meal. For 14 days, then take 7 days off.  Cycle 1 112 tablet 0  . diphenhydrAMINE (BENADRYL) 25 MG tablet Take 25 mg by mouth every 6 (six) hours as needed for itching.    . hyaluronate sodium (RADIAPLEXRX) GEL Apply 1 application topically 2 (two) times daily.    Marland Kitchen HYDROcodone-acetaminophen (NORCO/VICODIN) 5-325 MG tablet Take 1-2 tablets  by mouth every 4 (four) hours as needed for moderate pain. 15 tablet 0  . lisinopril (PRINIVIL,ZESTRIL) 10 MG tablet Take 1 tablet (10 mg total) by mouth daily. Take 10 mg by mouth every morning. 30 tablet  3  . LORazepam (ATIVAN) 0.5 MG tablet Take 1 tablet (0.5 mg total) by mouth every 6 (six) hours as needed (Nausea or vomiting). 30 tablet 0  . metoprolol (LOPRESSOR) 50 MG tablet Take 1 tablet (50 mg total) by mouth 2 (two) times daily. (Patient taking differently: Take 25 mg by mouth 2 (two) times daily. ) 180 tablet 3  . Multiple Vitamin (MULTIVITAMIN) tablet Take 1 tablet by mouth daily.     . non-metallic deodorant Jethro Poling) MISC Apply 1 application topically daily as needed.    . ondansetron (ZOFRAN) 8 MG tablet Take 1 tablet (8 mg total) by mouth 2 (two) times daily as needed. Start on the third day after chemotherapy. 30 tablet 1  . silver sulfADIAZINE (SILVADENE) 1 % cream Apply 1 application topically daily.     No current facility-administered medications for this visit.    PHYSICAL EXAMINATION: ECOG PERFORMANCE STATUS: 1 - Symptomatic but completely ambulatory  There were no vitals filed for this visit. There were no vitals filed for this visit.  GENERAL:alert, no distress and comfortable SKIN: skin color, texture, turgor are normal, no rashes or significant lesions EYES: normal, Conjunctiva are pink and non-injected, sclera clear OROPHARYNX:no exudate, no erythema and lips, buccal mucosa, and tongue normal  NECK: supple, thyroid normal size, non-tender, without nodularity LYMPH:  no palpable lymphadenopathy in the cervical, axillary or inguinal LUNGS: clear to auscultation and percussion with normal breathing effort HEART: regular rate & rhythm and no murmurs and no lower extremity edema ABDOMEN:abdomen soft, non-tender and normal bowel sounds Musculoskeletal:no cyanosis of digits and no clubbing  NEURO: alert & oriented x 3 with fluent speech, grade 1 peripheral neuropathy  LABORATORY DATA:  I have reviewed the data as listed   Chemistry      Component Value Date/Time   NA 140 11/16/2015 1027   NA 137 06/24/2015 0550   K 4.5 11/16/2015 1027   K 4.2 06/24/2015 0550   CL  106 06/24/2015 0550   CO2 23 11/16/2015 1027   CO2 24 06/24/2015 0550   BUN 10.9 11/16/2015 1027   BUN 7 06/24/2015 0550   CREATININE 0.8 11/16/2015 1027   CREATININE 0.73 06/24/2015 0550      Component Value Date/Time   CALCIUM 9.6 11/16/2015 1027   CALCIUM 8.3* 06/24/2015 0550   ALKPHOS 84 11/16/2015 1027   ALKPHOS 58 06/24/2015 0550   AST 20 11/16/2015 1027   AST 16 06/24/2015 0550   ALT 12 11/16/2015 1027   ALT 18 06/24/2015 0550   BILITOT 0.57 11/16/2015 1027   BILITOT 0.2* 06/24/2015 0550       Lab Results  Component Value Date   WBC 4.2 11/16/2015   HGB 13.1 11/16/2015   HCT 39.6 11/16/2015   MCV 106.5* 11/16/2015   PLT 241 11/16/2015   NEUTROABS 2.6 11/16/2015   ASSESSMENT & PLAN:  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 Adjuvant radiation therapy with concurrent Xeloda complete 10/23/2015 2. Adjuvant Xeloda 8 cycles (based on results from CREATE-X and FINX-X studies) currently on 1500 by mouth twice a day to be increased to 2000 mg by mouth twice a day with cycle 2  Chest wall recurrence: Status post excision by Dr. Molli Posey on 08/21/2015. Currently she takes Xeloda with radiation for chemotherapy sensitivity.   Xeloda toxicities: 1000 mg by mouth twice a day concurrently with radiation  No major side effects.  She will finish radiation on November 14.  Blood work was reviewed and the CBC looks completely normal. Grade 1 peripheral neuropathy in the lower extremities  I would like to see her on 2000 twice a day dose.  No orders of the defined types were placed in this encounter.   The patient has a good understanding of the overall plan. she agrees with it. she will call with any problems that may develop before the next visit here.   Rulon Eisenmenger, MD 11/16/2015

## 2015-11-16 NOTE — Patient Instructions (Signed)
Medication Instructions:  STOP Lisinopril   Labwork: None Ordered   Testing/Procedures: None Ordered   Follow-Up: Your physician wants you to follow-up in: 1 year with Dr. Acie Fredrickson.  You will receive a reminder letter in the mail two months in advance. If you don't receive a letter, please call our office to schedule the follow-up appointment.   If you need a refill on your cardiac medications before your next appointment, please call your pharmacy.   Thank you for choosing [facility]! Christen Bame, RN [phone]

## 2015-11-16 NOTE — Telephone Encounter (Signed)
Appointments made and avs printed for patient °

## 2015-11-16 NOTE — Addendum Note (Signed)
Addended by: Prentiss Bells on: 11/16/2015 07:58 PM   Modules accepted: Medications

## 2015-11-16 NOTE — Telephone Encounter (Signed)
Error

## 2015-11-16 NOTE — Progress Notes (Signed)
[name] Date of Birth  Aug 20, 1951 [facility] [address]    [place], [place]  [address] [phone]  Fax  [phone]  Problem List: 1. Mild LVOT obstruction 2. HTN 3. Dyslipidemia 4. Left breast cancer   History of Present Illness:  Abril 64 year old female the history of left ventricular outflow tract obstruction. She has a history of dyslipidemia and hypertension. She has done well since I last saw her. She's not had any episodes of chest pain or shortness of breath.  She has lost her job and her insurance since I last saw her.  She has not had any problems.  Has not been exercising much.  Nov. 21, 2014  No CP , no dyspnea.    BP at home have been well controlled.    Getting some exercise.   Still out of work.    Dec. 8, 2015:  Debora is doing well.  She is walking regularly. No CP or dyspnea.  Still smoking.   Advised her to quit. Wants to wait and check lipids after May ( will have Medicare at that time) , no insurance at this time .   Dec. 8, 2016:  doig well from [facility] [place]    Current Outpatient Prescriptions on File Prior to Visit  Medication Sig Dispense Refill  . aspirin 81 MG tablet Take 81 mg by mouth daily.      Marland Kitchen HYDROcodone-acetaminophen (NORCO/VICODIN) 5-325 MG tablet Take 1-2 tablets by mouth every 4 (four) hours as needed for moderate pain. 15 tablet 0  . lisinopril (PRINIVIL,ZESTRIL) 10 MG tablet Take 1 tablet (10 mg total) by mouth daily. Take 10 mg by mouth every morning. 30 tablet 3  . metoprolol (LOPRESSOR) 50 MG tablet Take 1 tablet (50 mg total) by mouth 2 (two) times daily. (Patient taking differently: Take 25 mg by mouth 2 (two) times daily. ) 180 tablet 3  . Multiple Vitamin (MULTIVITAMIN) tablet Take 1 tablet by mouth daily.      No current facility-administered medications on file prior to visit.    Allergies  Allergen Reactions  . Codeine Itching    Past Medical History  Diagnosis Date  .  Hyperlipidemia   . Left ventricular outflow obstruction   . Cigarette smoker   . Vitamin D deficiency   . Osteopenia   . Hemorrhoids   . Ventricular hypertrophy   . Wears glasses   . Heart murmur   . Hypertension     metoprolol  . Inflammation of finger or toe     pt. states fingers  . Hernia     umbilical  . Pneumonia 123456 X ?2  . Chronic bronchitis ([place])     "almost q yr;  usually in the fall" (06/23/2015)  . Cancer of left breast ([place])     mastectomy 06/23/2015  . Arthritis     "joint pain in my fingers" (06/23/2015)    Past Surgical History  Procedure Laterality Date  . Tonsillectomy    . Tubal ligation    . US echocardiography  04/24/2007    EF 55-60%  . Cardiovascular stress test  05/07/2007    EF 82% NO ISCHEMIA  . Wisdom tooth extraction    . Portacath placement Right 01/11/2015    Procedure: INSERTION PORT-A-CATH;  Surgeon: Donnie Mesa, MD;  Location: [facility];  Service: General;  Laterality: Right;     Attempted  failed to implant,   . Colonoscopy    . Mastectomy  Left 06/23/2015  . Port-a-cath removal  06/23/2015  . Breast biopsy Left ~ 01/2015  . Peripheral vascular catheterization Right 06/23/2015    Procedure: PORTA CATH REMOVAL;  Surgeon: Donnie Mesa, MD;  Location: [place] [facility];  Service: General;  Laterality: Right;  . Total mastectomy Left 06/23/2015    Procedure: LEFT MASTECTOMY;  Surgeon: Donnie Mesa, MD;  Location: [facility];  Service: General;  Laterality: Left;    History  Smoking status  . Current Every Day Smoker -- 0.50 packs/day for 45 years  . Types: Cigarettes  Smokeless tobacco  . Never Used    History  Alcohol Use  . 1.2 oz/week  . 2 Glasses of wine per week    Family History  Problem Relation Age of Onset  . Hypertension Mother   . Heart attack Mother   . Heart failure Father   . Hypertension Father   . Cancer Paternal Aunt 33    Colon Cancer    Reviw of Systems:  Reviewed in the HPI.  All other systems are  negative.  Physical Exam: BP 98/60 mmHg  Pulse 81  Ht 5\' 3"  (1.6 m)  Wt 158 lb 12.8 oz (72.031 kg)  BMI 28.14 kg/m2 The patient is alert and oriented x 3.  The mood and affect are normal.   Skin: warm and dry.  Color is normal.    HEENT:   Normocephalic/atraumatic. Her carotids are normal. There is no JVD.  Lungs: Her lungs are clear to auscultation  The left side of her chest is burned from XRT.   S/p left mastectomy   Heart: Regular rate, S1-S2  .  There is no significant murmur  Abdomen: Normal bowel sounds. There is no hepatosplenomegaly.  Extremities:  No clubbing cyanosis or edema  Neuro:  Nonfocal, her gait is normal    ECG:  November 16, 2015: Normal sinus rhythm at 81. She has nonspecific ST and T wave changes.  Assessment / Plan:   1. Mild LVOT obstruction-  Continue metoprolol at current dose.  2. HTN-  She has a history of hypertension but her blood pressures actually low now. We'll discontinue the lisinopril. 3. Dyslipidemia  4. Left breast cancer -  Status post left mastectomy, radiation therapy, and chemotherapy.   Nahser, Wonda Cheng, MD  11/16/2015 2:17 PM    [facility] [place],  [place], [place]  [address] Pager [phone] Phone: [phone]; Fax: [phone]   [facility] [address]  [address] [place], [place]  [address] [phone]   Fax [phone]

## 2015-11-20 ENCOUNTER — Encounter: Payer: Self-pay | Admitting: Hematology and Oncology

## 2015-11-20 ENCOUNTER — Ambulatory Visit (HOSPITAL_BASED_OUTPATIENT_CLINIC_OR_DEPARTMENT_OTHER): Payer: Medicare Other | Admitting: Hematology and Oncology

## 2015-11-20 VITALS — BP 119/69 | HR 84 | Temp 98.0°F | Resp 18 | Ht 63.0 in | Wt 159.2 lb

## 2015-11-20 DIAGNOSIS — C50212 Malignant neoplasm of upper-inner quadrant of left female breast: Secondary | ICD-10-CM | POA: Diagnosis not present

## 2015-11-20 DIAGNOSIS — C7989 Secondary malignant neoplasm of other specified sites: Secondary | ICD-10-CM | POA: Diagnosis not present

## 2015-11-20 DIAGNOSIS — N63 Unspecified lump in breast: Secondary | ICD-10-CM | POA: Diagnosis not present

## 2015-11-20 DIAGNOSIS — Z171 Estrogen receptor negative status [ER-]: Secondary | ICD-10-CM

## 2015-11-20 NOTE — Addendum Note (Signed)
Addended by: Prentiss Bells on: 11/20/2015 06:38 PM   Modules accepted: Medications

## 2015-11-20 NOTE — Assessment & Plan Note (Signed)
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 Adjuvant radiation therapy with concurrent Xeloda complete 10/23/2015 2. Adjuvant Xeloda 8 cycles (based on results from CREATE-X and FINX-X studies) currently on 1500 by mouth twice a day to be increased to 2000 mg by mouth twice a day with cycle 2  Chest wall recurrence: Status post excision by Dr. Molli Posey on 08/21/2015. She takes Xeloda with radiation for chemotherapy sensitivity completed 10/23/2015.   Current treatment: Xeloda 2000 mg by mouth twice a day No major side effects to Xeloda denies any diarrhea or rashes. Denies mucositis.   New lump in the right breast

## 2015-11-20 NOTE — Progress Notes (Signed)
Patient Care Team: Antony Blackbird, MD as PCP - General (Family Medicine)  DIAGNOSIS: Breast cancer of upper-inner quadrant of left female breast Patty Buckley)   Staging form: Breast, AJCC 7th Edition     Clinical: Stage IIIB (T4b, N0, M0) - Signed by Rulon Eisenmenger, MD on 01/17/2015   SUMMARY OF ONCOLOGIC HISTORY:   Breast cancer of upper-inner quadrant of left female breast ([place])   12/27/2014 Initial Diagnosis Grade 3 invasive mammary cancer, one lymph node negative in left axilla, ER 0%, PR 0%, Ki-67 98%, HER-2 negative ratio 1.38   01/06/2015 Breast MRI Left breast large enhancing mass, evidence of necrosis, extending from posterior through the middle third of left breast, extending from chest wall to the dermis, close to pectoralis fascia extending 4.5 cm, several bilateral axillary lymph nodes   01/17/2015 - 05/30/2015 Neo-Adjuvant Chemotherapy Neoadjuvant chemotherapy with dose dense Adriamycin and Cytoxan followed by Taxol and carboplatin   01/24/2015 - 01/27/2015 Hospital Admission Neutropenic fever admission, no source identified   06/08/2015 Breast MRI Interval erosion of the known carcinoma within the superior left breast through the overlying skin. The majority of the mass is now exophytic, decrease in Left Axill LN   06/23/2015 Surgery Left simple mastectomy: Invasive ductal carcinoma grade 3; 6.5 cm, lymphovascular invasion, margins negative, 0/3 lymph nodes negative, T4b N0M0 stage IIIB pathologic staging, ER 0%, PR 0%, HER-2 negative, Ki-67 98%   08/21/2015 Relapse/Recurrence Chest wall recurrence excision positive for breast cancer HER-2 negative   09/05/2015 - 10/23/2015 Radiation Therapy Radiation with Xeloda 1000 mg by mouth twice a day   11/09/2015 -  Chemotherapy Xeloda 1500 bid increased to 2000 mg bid starting 11/30/15    CHIEF COMPLIANT: New lump in the right breast  INTERVAL HISTORY: Patty Buckley is a 64 year old with above-mentioned history of left breast cancer treated with  neoadjuvant chemotherapy followed by mastectomy, chest wall radiation and is currently on Xeloda adjuvant therapy. She came in urgently because she felt a lump in the right breast in the upper outer quadrant and got very concerned and made an appointment today. She has only needed his noticed it today. It occasionally causes mild discomfort. She does not have any side effects to Xeloda.  REVIEW OF SYSTEMS:   Constitutional: Denies fevers, chills or abnormal weight loss Eyes: Denies blurriness of vision Ears, nose, mouth, throat, and face: Denies mucositis or sore throat Respiratory: Denies cough, dyspnea or wheezes Cardiovascular: Denies palpitation, chest discomfort or lower extremity swelling Gastrointestinal:  Denies nausea, heartburn or change in bowel habits Skin: Denies abnormal skin rashes Lymphatics: Denies new lymphadenopathy or easy bruising Neurological:Denies numbness, tingling or new weaknesses Behavioral/Psych: Mood is stable, no new changes  Breast: Right breast lump All other systems were reviewed with the patient and are negative.  I have reviewed the past medical history, past surgical history, social history and family history with the patient and they are unchanged from previous note.  ALLERGIES:  is allergic to codeine.  MEDICATIONS:  Current Outpatient Prescriptions  Medication Sig Dispense Refill  . aspirin 81 MG tablet Take 81 mg by mouth daily.      Derrill Memo ON 11/30/2015] capecitabine (XELODA) 500 MG tablet Take 4 tablets (2,000 mg total) by mouth 2 (two) times daily after a meal. For 14 days, then take 7 days off.  Cycle 1 112 tablet 0  . HYDROcodone-acetaminophen (NORCO/VICODIN) 5-325 MG tablet Take 1-2 tablets by mouth every 4 (four) hours as needed for moderate pain. 15 tablet  0  . metoprolol (LOPRESSOR) 50 MG tablet Take 1 tablet (50 mg total) by mouth 2 (two) times daily. (Patient taking differently: Take 25 mg by mouth 2 (two) times daily. ) 180 tablet 3  .  Multiple Vitamin (MULTIVITAMIN) tablet Take 1 tablet by mouth daily.      No current facility-administered medications for this visit.    PHYSICAL EXAMINATION: ECOG PERFORMANCE STATUS: 1 - Symptomatic but completely ambulatory  Filed Vitals:   11/20/15 1454  BP: 119/69  Pulse: 84  Temp: 98 F (36.7 C)  Resp: 18   Filed Weights   11/20/15 1454  Weight: 159 lb 3.2 oz (72.213 kg)    GENERAL:alert, no distress and comfortable SKIN: skin color, texture, turgor are normal, no rashes or significant lesions EYES: normal, Conjunctiva are pink and non-injected, sclera clear OROPHARYNX:no exudate, no erythema and lips, buccal mucosa, and tongue normal  NECK: supple, thyroid normal size, non-tender, without nodularity LYMPH:  no palpable lymphadenopathy in the cervical, axillary or inguinal LUNGS: clear to auscultation and percussion with normal breathing effort HEART: regular rate & rhythm and no murmurs and no lower extremity edema ABDOMEN:abdomen soft, non-tender and normal bowel sounds Musculoskeletal:no cyanosis of digits and no clubbing  NEURO: alert & oriented x 3 with fluent speech, no focal motor/sensory deficits BREAST: Clearly palpable mass in the right breast upper outer quadrant. (exam performed in the presence of a chaperone)  LABORATORY DATA:  I have reviewed the data as listed   Chemistry      Component Value Date/Time   NA 140 11/16/2015 1027   NA 137 06/24/2015 0550   K 4.5 11/16/2015 1027   K 4.2 06/24/2015 0550   CL 106 06/24/2015 0550   CO2 23 11/16/2015 1027   CO2 24 06/24/2015 0550   BUN 10.9 11/16/2015 1027   BUN 7 06/24/2015 0550   CREATININE 0.8 11/16/2015 1027   CREATININE 0.73 06/24/2015 0550      Component Value Date/Time   CALCIUM 9.6 11/16/2015 1027   CALCIUM 8.3* 06/24/2015 0550   ALKPHOS 84 11/16/2015 1027   ALKPHOS 58 06/24/2015 0550   AST 20 11/16/2015 1027   AST 16 06/24/2015 0550   ALT 12 11/16/2015 1027   ALT 18 06/24/2015 0550    BILITOT 0.57 11/16/2015 1027   BILITOT 0.2* 06/24/2015 0550       Lab Results  Component Value Date   WBC 4.2 11/16/2015   HGB 13.1 11/16/2015   HCT 39.6 11/16/2015   MCV 106.5* 11/16/2015   PLT 241 11/16/2015   NEUTROABS 2.6 11/16/2015   ASSESSMENT & PLAN:  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 Adjuvant radiation therapy with concurrent Xeloda complete 10/23/2015 2. Adjuvant Xeloda 8 cycles (based on results from CREATE-X and FINX-X studies) currently on 1500 by mouth twice a day to be increased to 2000 mg by mouth twice a day with cycle 2  Chest wall recurrence: Status post excision by Dr. Molli Posey on 08/21/2015. She takes Xeloda with radiation for  chemotherapy sensitivity completed 10/23/2015.   Current treatment: Xeloda 2000 mg by mouth twice a day No major side effects to Xeloda denies any diarrhea or rashes. Denies mucositis.  New lump in the right breast: Patient will be set up for a mammogram ultrasound and biopsy. Return to clinic in 2 weeks to review the results of the biopsies. I'm very concerned for recurrence of breast cancer.    Orders Placed This Encounter  Procedures  . MM Digital Diagnostic Unilat R    We request for an expeditious workup. Thank you    Standing Status: Future     Number of Occurrences:        Standing Expiration Date: 11/19/2016    Order Specific Question:  Reason for Exam (SYMPTOM  OR DIAGNOSIS REQUIRED)    Answer:  Palpable lump in the right breast with history of left breast cancer    Order Specific Question:  Preferred imaging location?    Answer:  [facility]  . US BREAST COMPLETE UNI RIGHT INC AXILLA    Please biopsy the nodule    Standing Status: Future     Number of Occurrences:      Standing Expiration Date: 01/20/2017    Order Specific Question:  Reason for Exam (SYMPTOM  OR DIAGNOSIS REQUIRED)    Answer:  Palpable lump in the right upper outer quadrant breast    Order Specific Question:  Preferred imaging location?    Answer:  [facility]   The patient has a good understanding of the overall plan. she agrees with it. she will call with any problems that may develop before the next visit here.   Rulon Eisenmenger, MD 11/20/2015

## 2015-11-21 ENCOUNTER — Telehealth: Payer: Self-pay | Admitting: Hematology and Oncology

## 2015-11-21 NOTE — Telephone Encounter (Signed)
Called patient and she is aware of her follow up °

## 2015-11-24 ENCOUNTER — Ambulatory Visit
Admission: RE | Admit: 2015-11-24 | Discharge: 2015-11-24 | Disposition: A | Discharge status: Home / Self Care | Payer: Medicare Other | Source: Ambulatory Visit | Attending: Hematology and Oncology | Admitting: Hematology and Oncology

## 2015-11-24 ENCOUNTER — Other Ambulatory Visit: Payer: Self-pay | Admitting: Hematology and Oncology

## 2015-11-24 ENCOUNTER — Ambulatory Visit
Admission: RE | Admit: 2015-11-24 | Discharge: 2015-11-24 | Disposition: A | Discharge status: Home / Self Care | Payer: Medicare Other | Source: Ambulatory Visit | Attending: Radiation Oncology | Admitting: Radiation Oncology

## 2015-11-24 ENCOUNTER — Ambulatory Visit: Payer: Medicare Other

## 2015-11-24 ENCOUNTER — Encounter: Payer: Self-pay | Admitting: Radiation Oncology

## 2015-11-24 VITALS — BP 131/66 | HR 85 | Temp 97.7°F | Ht 63.0 in | Wt 160.9 lb

## 2015-11-24 DIAGNOSIS — C50212 Malignant neoplasm of upper-inner quadrant of left female breast: Secondary | ICD-10-CM

## 2015-11-24 NOTE — Progress Notes (Signed)
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 Breast cancer of upper-inner quadrant of left female breast ([place]) 174.2 C50.212    Radiation treatment dates:   9-29 to 10-23-15 Site/dose: 1. Left Chest Wall/ 50 Gy in 25 fractions.         2. Left Chest Wall Scar Boost/ 10 Gy in 5 fractions.         3. Left Mid Axilla and SCLAV/ 45 Gy in 25 fractions.         4. Left Chest Wall Boost #2/ 6 Gy in 3 fractions.   Narrative:  The patient returns today for routine follow-up of radiation completed 10/23/15 to her Left Chest Wall. She reports her fatigue is better. Her Left Breast is hyperpigmented and dry. Her open areas are healed. She is using her own cream at home to her Left Chest area. She has had a biopsy today of her Right Breast after she found a mass on self exam and saw Dr. Lindi Buckley on 12/12. She originally found the mass on Saturday, 12/10. She is not receiving infusions at this time, just xeloda. She does not recall the name of the thick lotion she uses on her left chest wall.  The radiologist thought there were two lymph nodes in the area where she felt something and biopsied the larger one.                               ALLERGIES:  is allergic to codeine.  Meds: Current Outpatient Prescriptions  Medication Sig Dispense Refill  . aspirin 81 MG tablet Take 81 mg by mouth daily.      Derrill Memo ON 11/30/2015] capecitabine (XELODA) 500 MG tablet Take 4 tablets (2,000 mg total) by mouth 2 (two) times daily after a meal. For 14 days, then take 7 days off.  Cycle 1 112 tablet 0  . Multiple Vitamin  (MULTIVITAMIN) tablet Take 1 tablet by mouth daily.     Marland Kitchen HYDROcodone-acetaminophen (NORCO/VICODIN) 5-325 MG tablet Take 1-2 tablets by mouth every 4 (four) hours as needed for moderate pain. (Patient not taking: Reported on 11/24/2015) 15 tablet 0  . metoprolol (LOPRESSOR) 50 MG tablet Take 1 tablet (50 mg total) by mouth 2 (two) times daily. 180 tablet 3   No current facility-administered medications for this encounter.    Physical Findings: The patient is in no acute distress. Patient is alert and oriented.  height is '5\' 3"'$  (1.6 m) and weight is 160 lb 14.4 oz (72.984 kg). Her temperature is 97.7 F (36.5 C). Her blood pressure is 131/66 and her pulse is 85. Marland Kitchen    She has bandaging in the UOQ of the right breast. Not palpated on this side. Left chest wall demonstrates residual hyperpigmentation, skin is intact but slightly dry. Left axilla skin is intact. Still some hyperpigmentation and dryness over the upper mid back. No signs of recurrence in the left chest wall to palpation.   Lab Findings: Lab Results  Component Value Date   WBC 4.2 11/16/2015   HGB 13.1  11/16/2015   HCT 39.6 11/16/2015   MCV 106.5* 11/16/2015   PLT 241 11/16/2015    Radiographic Findings: Mm Digital Diagnostic Unilat R  11/24/2015  CLINICAL DATA:  S/p ultrasound core needle biopsy of an enlarged abnormal right axillary lymph node. EXAM: DIAGNOSTIC RIGHT MAMMOGRAM POST ULTRASOUND BIOPSY COMPARISON:  Previous exam(s). FINDINGS: Mammographic images were obtained following ultrasound guided biopsy of an enlarged, abnormal appearing right axillary lymph node. The ribbon shaped biopsy clip lies with enlarged lymph node, only imaged on the MLO view. IMPRESSION: Well-positioned ribbon shaped biopsy clip following ultrasound-guided core needle biopsy of abnormal right axillary lymph node. Final Assessment: Post Procedure Mammograms for Marker Placement Electronically Signed   By: Amie Portland M.D.   On: 11/24/2015 15:13    US Breast Ltd Uni Right Inc Axilla  11/24/2015  CLINICAL DATA:  Patient presents with a palpable sore lump in the right axilla. Patient has left breast carcinoma, diagnosed in January 2016. She underwent surgery as well as radiation therapy and chemotherapy. EXAM: DIGITAL DIAGNOSTIC RIGHT MAMMOGRAM WITH 3D TOMOSYNTHESIS WITH CAD ULTRASOUND RIGHT BREAST/ AXILLA COMPARISON:  Previous exam(s). ACR Breast Density Category c: The breast tissue is heterogeneously dense, which may obscure small masses. FINDINGS: In the right axilla, are clip of abnormal enlarged lymph nodes, the largest measuring approximately 3.3 x 1.7 cm. There is no mass or distortion in the right breast. There are no suspicious calcifications. Right axillary enlarged abnormal lymph nodes are new the prior mammograms. There has been no other change. Mammographic images were processed with CAD. On physical exam, there is a tender mass in the right axilla. Targeted ultrasound is performed, showing a group of abnormal appearing lymph nodes with replacement of the hilar fat. These are hypoechoic. The largest measures 3.3 x 1.8 x 1.9 cm. IMPRESSION: Abnormal appearing, enlarged right axillary lymph nodes. Metastatic breast carcinoma is suspected. Nodes could potentially be reactive. Lymphoma is also in the differential diagnosis. RECOMMENDATION: Ultrasound-guided core needle biopsy of the largest abnormal right axillary lymph node. I have discussed the findings and recommendations with the patient. Results were also provided in writing at the conclusion of the visit. If applicable, a reminder letter will be sent to the patient regarding the next appointment. BI-RADS CATEGORY  4: Suspicious. Electronically Signed   By: Amie Portland M.D.   On: 11/24/2015 14:51   Mm Diag Breast Tomo Uni Right  11/24/2015  CLINICAL DATA:  Patient presents with a palpable sore lump in the right axilla. Patient has left breast carcinoma, diagnosed in January 2016. She  underwent surgery as well as radiation therapy and chemotherapy. EXAM: DIGITAL DIAGNOSTIC RIGHT MAMMOGRAM WITH 3D TOMOSYNTHESIS WITH CAD ULTRASOUND RIGHT BREAST/ AXILLA COMPARISON:  Previous exam(s). ACR Breast Density Category c: The breast tissue is heterogeneously dense, which may obscure small masses. FINDINGS: In the right axilla, are clip of abnormal enlarged lymph nodes, the largest measuring approximately 3.3 x 1.7 cm. There is no mass or distortion in the right breast. There are no suspicious calcifications. Right axillary enlarged abnormal lymph nodes are new the prior mammograms. There has been no other change. Mammographic images were processed with CAD. On physical exam, there is a tender mass in the right axilla. Targeted ultrasound is performed, showing a group of abnormal appearing lymph nodes with replacement of the hilar fat. These are hypoechoic. The largest measures 3.3 x 1.8 x 1.9 cm. IMPRESSION: Abnormal appearing, enlarged right axillary lymph nodes. Metastatic breast carcinoma is suspected. Nodes could potentially be reactive.  Lymphoma is also in the differential diagnosis. RECOMMENDATION: Ultrasound-guided core needle biopsy of the largest abnormal right axillary lymph node. I have discussed the findings and recommendations with the patient. Results were also provided in writing at the conclusion of the visit. If applicable, a reminder letter will be sent to the patient regarding the next appointment. BI-RADS CATEGORY  4: Suspicious. Electronically Signed   By: Lajean Manes M.D.   On: 11/24/2015 14:51   [place] Rt Breast Bx W Loc Dev 1st Lesion Img Bx Spec US Guide  11/24/2015  CLINICAL DATA:  Patient presents for ultrasound-guided core needle biopsy of an abnormal enlarged lymph node in the axilla. She has a personal history of left breast carcinoma diagnosed in January 2016. EXAM: ULTRASOUND GUIDED CORE NEEDLE BIOPSY OF A RIGHT AXILLARY NODE COMPARISON:  Previous exam(s). FINDINGS: I met  with the patient and we discussed the procedure of ultrasound-guided biopsy, including benefits and alternatives. We discussed the high likelihood of a successful procedure. We discussed the risks of the procedure, including infection, bleeding, tissue injury, clip migration, and inadequate sampling. Informed written consent was given. The usual time-out protocol was performed immediately prior to the procedure. Using sterile technique and 2% Lidocaine as local anesthetic, under direct ultrasound visualization, a 14 gauge this device was used to perform biopsy of the largest of the abnormal appearing lymph nodes using a lateral approach. At the conclusion of the procedure a ribbon shaped tissue marker clip was deployed into the biopsy cavity. Follow up 2 view mammogram was performed and dictated separately. IMPRESSION: Ultrasound guided biopsy of followup an enlarged, abnormal appearing right axillary lymph node. No apparent complications. Electronically Signed   By: Lajean Manes M.D.   On: 11/24/2015 15:13    Impression/Plan: Healing from RT, biopsy of mass on contralateral breast pending.   I have suggested the patient to begin using Vitamin E oil on her treated areas to manage dry skin.   I encouraged her to continue with yearly mammography and followup with medical oncology. I will see her back on an as-needed basis. I have encouraged her to call if she has any issues or concerns in the future. I wished her the very best.    _____________________________________    Eppie Gibson, MD   This document serves as a record of services personally performed by Eppie Gibson, MD. It was created on her behalf by Arlyce Harman, a trained medical scribe. The creation of this record is based on the scribe's personal observations and the provider's statements to them. This document has been checked and approved by the attending provider.

## 2015-11-24 NOTE — Progress Notes (Signed)
Patty Buckley presents for follow up of radiation completed 10/23/15 to her Left Chest Wall. She reports her fatigue is better. Her Left Breast is hyperpigmented and dry. Her open areas are healed. She is using her own cream at home to her Left Chest area. She has had a biopsy today of her Right Breast after she found a mass on self exam and saw Dr. Lindi Adie on 12/12.  BP 131/66 mmHg  Pulse 85  Temp(Src) 97.7 F (36.5 C)  Ht 5\' 3"  (1.6 m)  Wt 160 lb 14.4 oz (72.984 kg)  BMI 28.51 kg/m2   Wt Readings from Last 3 Encounters:  11/24/15 160 lb 14.4 oz (72.984 kg)  11/20/15 159 lb 3.2 oz (72.213 kg)  11/16/15 158 lb 12.8 oz (72.031 kg)

## 2015-11-27 ENCOUNTER — Telehealth: Payer: Self-pay | Admitting: *Deleted

## 2015-11-27 ENCOUNTER — Encounter: Payer: Self-pay | Admitting: *Deleted

## 2015-11-27 DIAGNOSIS — C773 Secondary and unspecified malignant neoplasm of axilla and upper limb lymph nodes: Principal | ICD-10-CM

## 2015-11-27 DIAGNOSIS — C50911 Malignant neoplasm of unspecified site of right female breast: Secondary | ICD-10-CM

## 2015-11-27 NOTE — Telephone Encounter (Signed)
Called pt to discuss new right breast cancer dx. Confirmed appt with Dr. Lindi Adie on 12/20 at [place] [facility]. Also discussed need for PET scan. Denies further needs at this time. Encourage pt to call with questions.

## 2015-11-28 ENCOUNTER — Ambulatory Visit (HOSPITAL_BASED_OUTPATIENT_CLINIC_OR_DEPARTMENT_OTHER): Payer: Medicare Other | Admitting: Hematology and Oncology

## 2015-11-28 ENCOUNTER — Telehealth: Payer: Self-pay | Admitting: Hematology and Oncology

## 2015-11-28 ENCOUNTER — Encounter: Payer: Self-pay | Admitting: *Deleted

## 2015-11-28 ENCOUNTER — Encounter: Payer: Self-pay | Admitting: Hematology and Oncology

## 2015-11-28 VITALS — BP 129/62 | HR 91 | Temp 97.6°F | Resp 18 | Ht 63.0 in | Wt 161.1 lb

## 2015-11-28 DIAGNOSIS — Z171 Estrogen receptor negative status [ER-]: Secondary | ICD-10-CM

## 2015-11-28 DIAGNOSIS — C7989 Secondary malignant neoplasm of other specified sites: Secondary | ICD-10-CM

## 2015-11-28 DIAGNOSIS — C50212 Malignant neoplasm of upper-inner quadrant of left female breast: Secondary | ICD-10-CM | POA: Diagnosis not present

## 2015-11-28 DIAGNOSIS — C773 Secondary and unspecified malignant neoplasm of axilla and upper limb lymph nodes: Secondary | ICD-10-CM | POA: Diagnosis not present

## 2015-11-28 NOTE — Progress Notes (Signed)
Patient Care Team: Antony Blackbird, MD as PCP - General (Family Medicine)  DIAGNOSIS: Breast cancer of upper-inner quadrant of left female breast [facility])   Staging form: Breast, AJCC 7th Edition     Clinical: Stage IIIB (T4b, N0, M0) - Signed by Rulon Eisenmenger, MD on 01/17/2015  SUMMARY OF ONCOLOGIC HISTORY:   Breast cancer of upper-inner quadrant of left female breast ([place])   12/27/2014 Initial Diagnosis Grade 3 invasive mammary cancer, one lymph node negative in left axilla, ER 0%, PR 0%, Ki-67 98%, HER-2 negative ratio 1.38   01/06/2015 Breast MRI Left breast large enhancing mass, evidence of necrosis, extending from posterior through the middle third of left breast, extending from chest wall to the dermis, close to pectoralis fascia extending 4.5 cm, several bilateral axillary lymph nodes   01/17/2015 - 05/30/2015 Neo-Adjuvant Chemotherapy Neoadjuvant chemotherapy with dose dense Adriamycin and Cytoxan followed by Taxol and carboplatin   01/24/2015 - 01/27/2015 Hospital Admission Neutropenic fever admission, no source identified   06/08/2015 Breast MRI Interval erosion of the known carcinoma within the superior left breast through the overlying skin. The majority of the mass is now exophytic, decrease in Left Axill LN   06/23/2015 Surgery Left simple mastectomy: Invasive ductal carcinoma grade 3; 6.5 cm, lymphovascular invasion, margins negative, 0/3 lymph nodes negative, T4b N0M0 stage IIIB pathologic staging, ER 0%, PR 0%, HER-2 negative, Ki-67 98%   08/21/2015 Relapse/Recurrence Chest wall recurrence excision positive for breast cancer HER-2 negative   09/05/2015 - 10/23/2015 Radiation Therapy Radiation with Xeloda 1000 mg by mouth twice a day   11/09/2015 -  Chemotherapy Xeloda 1500 bid increased to 2000 mg bid starting 11/30/15   11/24/2015 Mammogram Palpable right axillary lymph node: Mammogram and ultrasound revealed group of abnormal appearing lymph nodes largest measures 3.3 x 1.8 x 1.9 cm     CHIEF COMPLIANT: right axillary lymph node biopsy came back as breast cancer ER 0%, PR 0%  INTERVAL HISTORY: Patty Buckley is a 64 year old with above-mentioned history of left breast cancer triple negative disease who received neoadjuvant chemotherapy followed by mastectomy and then subsequently receive Xeloda with radiation and is currently on Xeloda maintenance therapy. She presented December 16 with complaint of a palpable right axillary lymph node. This was biopsy-proven to be breast cancer that was ER/PR negative. She is here today to discuss what treatment options she might have. She new that this was relapsed breast cancer. She has been set up for a PET/CT scan on 12/12/2015.  REVIEW OF SYSTEMS:   Constitutional: Denies fevers, chills or abnormal weight loss Eyes: Denies blurriness of vision Ears, nose, mouth, throat, and face: Denies mucositis or sore throat Respiratory: Denies cough, dyspnea or wheezes Cardiovascular: Denies palpitation, chest discomfort or lower extremity swelling Gastrointestinal:  Denies nausea, heartburn or change in bowel habits Skin: Denies abnormal skin rashes Lymphatics: Denies new lymphadenopathy or easy bruising Neurological:Denies numbness, tingling or new weaknesses Behavioral/Psych: Mood is stable, no new changes  Breast: Palpable lump in the right axilla biopsy was done last week All other systems were reviewed with the patient and are negative.  I have reviewed the past medical history, past surgical history, social history and family history with the patient and they are unchanged from previous note.  ALLERGIES:  is allergic to codeine.  MEDICATIONS:  Current Outpatient Prescriptions  Medication Sig Dispense Refill  . aspirin 81 MG tablet Take 81 mg by mouth daily.      Derrill Memo ON 11/30/2015] capecitabine (XELODA)  500 MG tablet Take 4 tablets (2,000 mg total) by mouth 2 (two) times daily after a meal. For 14 days, then take 7 days off.   Cycle 1 112 tablet 0  . HYDROcodone-acetaminophen (NORCO/VICODIN) 5-325 MG tablet Take 1-2 tablets by mouth every 4 (four) hours as needed for moderate pain. (Patient not taking: Reported on 11/24/2015) 15 tablet 0  . metoprolol (LOPRESSOR) 50 MG tablet Take 1 tablet (50 mg total) by mouth 2 (two) times daily. 180 tablet 3  . Multiple Vitamin (MULTIVITAMIN) tablet Take 1 tablet by mouth daily.      No current facility-administered medications for this visit.    PHYSICAL EXAMINATION: ECOG PERFORMANCE STATUS: 1 - Symptomatic but completely ambulatory  Filed Vitals:   11/28/15 1244  BP: 129/62  Pulse: 91  Temp: 97.6 F (36.4 C)  Resp: 18   Filed Weights   11/28/15 1244  Weight: 161 lb 1.6 oz (73.074 kg)    GENERAL:alert, no distress and comfortable SKIN: skin color, texture, turgor are normal, no rashes or significant lesions EYES: normal, Conjunctiva are pink and non-injected, sclera clear OROPHARYNX:no exudate, no erythema and lips, buccal mucosa, and tongue normal  NECK: supple, thyroid normal size, non-tender, without nodularity LYMPH:  no palpable lymphadenopathy in the cervical, axillary or inguinal LUNGS: clear to auscultation and percussion with normal breathing effort HEART: regular rate & rhythm and no murmurs and no lower extremity edema ABDOMEN:abdomen soft, non-tender and normal bowel sounds Musculoskeletal:no cyanosis of digits and no clubbing  NEURO: alert & oriented x 3 with fluent speech, no focal motor/sensory deficits   LABORATORY DATA:  I have reviewed the data as listed   Chemistry      Component Value Date/Time   NA 140 11/16/2015 1027   NA 137 06/24/2015 0550   K 4.5 11/16/2015 1027   K 4.2 06/24/2015 0550   CL 106 06/24/2015 0550   CO2 23 11/16/2015 1027   CO2 24 06/24/2015 0550   BUN 10.9 11/16/2015 1027   BUN 7 06/24/2015 0550   CREATININE 0.8 11/16/2015 1027   CREATININE 0.73 06/24/2015 0550      Component Value Date/Time   CALCIUM 9.6  11/16/2015 1027   CALCIUM 8.3* 06/24/2015 0550   ALKPHOS 84 11/16/2015 1027   ALKPHOS 58 06/24/2015 0550   AST 20 11/16/2015 1027   AST 16 06/24/2015 0550   ALT 12 11/16/2015 1027   ALT 18 06/24/2015 0550   BILITOT 0.57 11/16/2015 1027   BILITOT 0.2* 06/24/2015 0550       Lab Results  Component Value Date   WBC 4.2 11/16/2015   HGB 13.1 11/16/2015   HCT 39.6 11/16/2015   MCV 106.5* 11/16/2015   PLT 241 11/16/2015   NEUTROABS 2.6 11/16/2015   ASSESSMENT & PLAN:  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 Adjuvant radiation therapy with concurrent Xeloda complete 10/23/2015 2. Adjuvant Xeloda 8 cycles (based on results from CREATE-X and FINX-X studies) currently on 1500 by mouth twice a day to be  increased to 2000 mg by mouth twice a day with cycle 2  Chest wall recurrence: Status post excision by Dr. Molli Posey on 08/21/2015. She takes Xeloda with radiation for chemotherapy sensitivity completed 10/23/2015.   Current treatment: Xeloda 2000 mg by mouth twice a day No major side effects to Xeloda denies any diarrhea or rashes. Denies mucositis.  Lump in the right axilla: Biopsy-proven to be breast cancer ER 0%, PR 0% PET CT scan has been scheduled for 12/12/2015 Follow-up on 12/13/2015 to discuss a treatment plan. I will request an  appointment with Dr.Tsuei to evaluate surgical options if she does not have distended metastatic disease.      No orders of the defined types were placed in this encounter.   The patient has a good understanding of the overall plan. she agrees with it. she will call with any problems that may develop before the next visit here.   Rulon Eisenmenger, MD 11/28/2015

## 2015-11-28 NOTE — Telephone Encounter (Signed)
Appointments made and avs printed °

## 2015-11-28 NOTE — Assessment & Plan Note (Signed)
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 Adjuvant radiation therapy with concurrent Xeloda complete 10/23/2015 2. Adjuvant Xeloda 8 cycles (based on results from CREATE-X and FINX-X studies) currently on 1500 by mouth twice a day to be increased to 2000 mg by mouth twice a day with cycle 2  Chest wall recurrence: Status post excision by Dr. Molli Posey on 08/21/2015. She takes Xeloda with radiation for chemotherapy sensitivity completed 10/23/2015.   Current treatment: Xeloda 2000 mg by mouth twice a day No major side effects to Xeloda denies any diarrhea or rashes. Denies mucositis.  Lump in the right axilla: Biopsy-proven to be breast cancer ER 0%, PR 0% PET CT scan has been scheduled for 12/12/2015 Follow-up on 12/13/2015 to discuss a treatment plan. I will request an appointment with Dr.Tsuei to evaluate surgical options if she does not have distended metastatic disease.

## 2015-12-05 ENCOUNTER — Ambulatory Visit: Payer: Medicare Other | Admitting: Hematology and Oncology

## 2015-12-05 ENCOUNTER — Encounter: Payer: Self-pay | Admitting: Hematology and Oncology

## 2015-12-05 NOTE — Progress Notes (Signed)
I faxed PAF form   [phone]. Jonelle Sports filled it out.

## 2015-12-12 ENCOUNTER — Ambulatory Visit (HOSPITAL_COMMUNITY)
Admission: RE | Admit: 2015-12-12 | Discharge: 2015-12-12 | Disposition: A | Discharge status: Home / Self Care | Payer: Medicare Other | Source: Ambulatory Visit | Attending: Hematology and Oncology | Admitting: Hematology and Oncology

## 2015-12-12 DIAGNOSIS — C773 Secondary and unspecified malignant neoplasm of axilla and upper limb lymph nodes: Secondary | ICD-10-CM | POA: Insufficient documentation

## 2015-12-12 DIAGNOSIS — C50911 Malignant neoplasm of unspecified site of right female breast: Secondary | ICD-10-CM | POA: Diagnosis not present

## 2015-12-12 MED ORDER — FLUDEOXYGLUCOSE F - 18 (FDG) INJECTION
9.4000 | Freq: Once | INTRAVENOUS | Status: AC | PRN
  Administered 2015-12-12: 9.4 via INTRAVENOUS

## 2015-12-13 ENCOUNTER — Ambulatory Visit (HOSPITAL_BASED_OUTPATIENT_CLINIC_OR_DEPARTMENT_OTHER): Payer: Medicare Other | Admitting: Hematology and Oncology

## 2015-12-13 ENCOUNTER — Ambulatory Visit (HOSPITAL_BASED_OUTPATIENT_CLINIC_OR_DEPARTMENT_OTHER): Payer: Medicare Other

## 2015-12-13 ENCOUNTER — Encounter: Payer: Self-pay | Admitting: Hematology and Oncology

## 2015-12-13 VITALS — BP 124/55 | HR 85 | Temp 97.6°F | Resp 18 | Ht 63.0 in | Wt 158.9 lb

## 2015-12-13 DIAGNOSIS — C7989 Secondary malignant neoplasm of other specified sites: Secondary | ICD-10-CM

## 2015-12-13 DIAGNOSIS — Z171 Estrogen receptor negative status [ER-]: Secondary | ICD-10-CM | POA: Diagnosis not present

## 2015-12-13 DIAGNOSIS — C773 Secondary and unspecified malignant neoplasm of axilla and upper limb lymph nodes: Secondary | ICD-10-CM

## 2015-12-13 DIAGNOSIS — C50212 Malignant neoplasm of upper-inner quadrant of left female breast: Secondary | ICD-10-CM

## 2015-12-13 LAB — CBC WITH DIFFERENTIAL/PLATELET
BASO%: 0.4 % (ref 0.0–2.0)
Basophils Absolute: 0 10*3/uL (ref 0.0–0.1)
EOS ABS: 0.2 10*3/uL (ref 0.0–0.5)
EOS%: 3.8 % (ref 0.0–7.0)
HCT: 36.9 % (ref 34.8–46.6)
HEMOGLOBIN: 12.5 g/dL (ref 11.6–15.9)
LYMPH%: 19.4 % (ref 14.0–49.7)
MCH: 36.8 pg — ABNORMAL HIGH (ref 25.1–34.0)
MCHC: 33.9 g/dL (ref 31.5–36.0)
MCV: 108.5 fL — AB (ref 79.5–101.0)
MONO#: 0.5 10*3/uL (ref 0.1–0.9)
MONO%: 9.2 % (ref 0.0–14.0)
NEUT%: 67.2 % (ref 38.4–76.8)
NEUTROS ABS: 3.4 10*3/uL (ref 1.5–6.5)
PLATELETS: 171 10*3/uL (ref 145–400)
RBC: 3.4 10*6/uL — AB (ref 3.70–5.45)
RDW: 16.3 % — ABNORMAL HIGH (ref 11.2–14.5)
WBC: 5 10*3/uL (ref 3.9–10.3)
lymph#: 1 10*3/uL (ref 0.9–3.3)

## 2015-12-13 LAB — COMPREHENSIVE METABOLIC PANEL
ALBUMIN: 3.4 g/dL — AB (ref 3.5–5.0)
ALK PHOS: 101 U/L (ref 40–150)
ALT: 16 U/L (ref 0–55)
ANION GAP: 9 meq/L (ref 3–11)
AST: 19 U/L (ref 5–34)
BILIRUBIN TOTAL: 1.09 mg/dL (ref 0.20–1.20)
BUN: 10.9 mg/dL (ref 7.0–26.0)
CALCIUM: 9.3 mg/dL (ref 8.4–10.4)
CO2: 25 mEq/L (ref 22–29)
Chloride: 103 mEq/L (ref 98–109)
Creatinine: 0.8 mg/dL (ref 0.6–1.1)
EGFR: 82 mL/min/{1.73_m2} — AB (ref >90)
Glucose: 140 mg/dl (ref 70–140)
Potassium: 4 mEq/L (ref 3.5–5.1)
Sodium: 137 mEq/L (ref 136–145)
TOTAL PROTEIN: 6.2 g/dL — AB (ref 6.4–8.3)

## 2015-12-13 LAB — GLUCOSE, CAPILLARY: Glucose-Capillary: 99 mg/dL (ref 65–99)

## 2015-12-13 NOTE — Addendum Note (Signed)
Addended by: Prentiss Bells on: 12/13/2015 06:57 PM   Modules accepted: Medications

## 2015-12-13 NOTE — Progress Notes (Signed)
Patient Care Team: Antony Blackbird, MD as PCP - General (Family Medicine)  DIAGNOSIS: Breast cancer of upper-inner quadrant of left female breast [facility])   Staging form: Breast, AJCC 7th Edition     Clinical: Stage IIIB (T4b, N0, M0) - Signed by Rulon Eisenmenger, MD on 01/17/2015   SUMMARY OF ONCOLOGIC HISTORY:   Breast cancer of upper-inner quadrant of left female breast ([place])   12/27/2014 Initial Diagnosis Grade 3 invasive mammary cancer, one lymph node negative in left axilla, ER 0%, PR 0%, Ki-67 98%, HER-2 negative ratio 1.38   01/06/2015 Breast MRI Left breast large enhancing mass, evidence of necrosis, extending from posterior through the middle third of left breast, extending from chest wall to the dermis, close to pectoralis fascia extending 4.5 cm, several bilateral axillary lymph nodes   01/17/2015 - 05/30/2015 Neo-Adjuvant Chemotherapy Neoadjuvant chemotherapy with dose dense Adriamycin and Cytoxan followed by Taxol and carboplatin   01/24/2015 - 01/27/2015 Hospital Admission Neutropenic fever admission, no source identified   06/08/2015 Breast MRI Interval erosion of the known carcinoma within the superior left breast through the overlying skin. The majority of the mass is now exophytic, decrease in Left Axill LN   06/23/2015 Surgery Left simple mastectomy: Invasive ductal carcinoma grade 3; 6.5 cm, lymphovascular invasion, margins negative, 0/3 lymph nodes negative, T4b N0M0 stage IIIB pathologic staging, ER 0%, PR 0%, HER-2 negative, Ki-67 98%   08/21/2015 Relapse/Recurrence Chest wall recurrence excision positive for breast cancer HER-2 negative   09/05/2015 - 10/23/2015 Radiation Therapy Radiation with Xeloda 1000 mg by mouth twice a day   11/09/2015 -  Chemotherapy Xeloda 1500 bid increased to 2000 mg bid starting 11/30/15   11/24/2015 Mammogram Palpable right axillary lymph node: Mammogram and ultrasound revealed group of abnormal appearing lymph nodes largest measures 3.3 x 1.8 x 1.9 cm   12/12/2015 PET scan Extensive hypermetabolic disease to the right axilla, lungs, liver, abdominal lymph nodes, left lamina of T12, bilateral femoral head AVN    CHIEF COMPLIANT: follow-up after PET/CT scan  INTERVAL HISTORY: Patty Buckley is a 65 year old with above-mentioned history of metastatic breast cancer who is currently on Xeloda. She had a recent PET/CT scan and is here to discuss the results. The PET CT showed extensive metastatic disease involving liver and axilla as well as 2 nodules in the lung and one isolated bone metastases. She does not have any abdominal pain or bilateral hip pain. The PET CT also suggested bilateral femoral head avascular necrosis but clinically she does not have any signs or symptoms of AVN.  REVIEW OF SYSTEMS:   Constitutional: Denies fevers, chills or abnormal weight loss Eyes: Denies blurriness of vision Ears, nose, mouth, throat, and face: Denies mucositis or sore throat Respiratory: Denies cough, dyspnea or wheezes Cardiovascular: Denies palpitation, chest discomfort Gastrointestinal:  Denies nausea, heartburn or change in bowel habits Skin: Denies abnormal skin rashes Lymphatics: Denies new lymphadenopathy or easy bruising Neurological:Denies numbness, tingling or new weaknesses Behavioral/Psych: Mood is stable, no new changes  Extremities: No lower extremity edema Breast: right axillary lymph nodes All other systems were reviewed with the patient and are negative.  I have reviewed the past medical history, past surgical history, social history and family history with the patient and they are unchanged from previous note.  ALLERGIES:  is allergic to codeine.  MEDICATIONS:  Current Outpatient Prescriptions  Medication Sig Dispense Refill  . aspirin 81 MG tablet Take 81 mg by mouth daily.      Marland Kitchen  capecitabine (XELODA) 500 MG tablet Take 4 tablets (2,000 mg total) by mouth 2 (two) times daily after a meal. For 14 days, then take 7 days off.   Cycle 1 112 tablet 0  . HYDROcodone-acetaminophen (NORCO/VICODIN) 5-325 MG tablet Take 1-2 tablets by mouth every 4 (four) hours as needed for moderate pain. (Patient not taking: Reported on 11/24/2015) 15 tablet 0  . metoprolol (LOPRESSOR) 50 MG tablet Take 1 tablet (50 mg total) by mouth 2 (two) times daily. 180 tablet 3  . Multiple Vitamin (MULTIVITAMIN) tablet Take 1 tablet by mouth daily.      No current facility-administered medications for this visit.    PHYSICAL EXAMINATION: ECOG PERFORMANCE STATUS: 1 - Symptomatic but completely ambulatory  Filed Vitals:   12/13/15 1243  BP: 124/55  Pulse: 85  Temp: 97.6 F (36.4 C)  Resp: 18   Filed Weights   12/13/15 1243  Weight: 158 lb 14.4 oz (72.077 kg)    GENERAL:alert, no distress and comfortable SKIN: skin color, texture, turgor are normal, no rashes or significant lesions EYES: normal, Conjunctiva are pink and non-injected, sclera clear OROPHARYNX:no exudate, no erythema and lips, buccal mucosa, and tongue normal  NECK: supple, thyroid normal size, non-tender, without nodularity LYMPH:  no palpable lymphadenopathy in the cervical, axillary or inguinal LUNGS: clear to auscultation and percussion with normal breathing effort HEART: regular rate & rhythm and no murmurs and no lower extremity edema ABDOMEN:no tenderness,  mild hepatomegaly MUSCULOSKELETAL:no cyanosis of digits and no clubbing  NEURO: alert & oriented x 3 with fluent speech, no focal motor/sensory deficits EXTREMITIES: No lower extremity edema  LABORATORY DATA:  I have reviewed the data as listed   Chemistry      Component Value Date/Time   NA 140 11/16/2015 1027   NA 137 06/24/2015 0550   K 4.5 11/16/2015 1027   K 4.2 06/24/2015 0550   CL 106 06/24/2015 0550   CO2 23 11/16/2015 1027   CO2 24 06/24/2015 0550   BUN 10.9 11/16/2015 1027   BUN 7 06/24/2015 0550   CREATININE 0.8 11/16/2015 1027   CREATININE 0.73 06/24/2015 0550      Component Value  Date/Time   CALCIUM 9.6 11/16/2015 1027   CALCIUM 8.3* 06/24/2015 0550   ALKPHOS 84 11/16/2015 1027   ALKPHOS 58 06/24/2015 0550   AST 20 11/16/2015 1027   AST 16 06/24/2015 0550   ALT 12 11/16/2015 1027   ALT 18 06/24/2015 0550   BILITOT 0.57 11/16/2015 1027   BILITOT 0.2* 06/24/2015 0550       Lab Results  Component Value Date   WBC 4.2 11/16/2015   HGB 13.1 11/16/2015   HCT 39.6 11/16/2015   MCV 106.5* 11/16/2015   PLT 241 11/16/2015   NEUTROABS 2.6 11/16/2015   ASSESSMENT & PLAN:  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 Molli Posey on 08/21/2015 Adjuvant radiation therapy with concurrent Xeloda completed 10/23/2015 Xeloda (based on results from CREATE-X study) 2000 mg by mouth twice a day -----------------------------------------------------------------------------------------------------------------------------------------------  Current treatment: Xeloda 2000 mg twice daily PET CT scan 33/00/7622:[id number] hypermetabolic disease to the right axilla (largest 4.7 cm laminated mass), lungs (6 mm Rt, 1.8 cm LLL nodules), liver (6X 7.1 cm; 6.4 x 4.8 cm), abdominal lymph nodes ( 1cm porta hepatis LN, left peri-aortic RP LN 5 mm), left lamina of T12, bilateral femoral head AVN  Radiology review: I discussed the PET CT scan reporting great detail and provided her with a copy of this report. Unfortunately she has widespread metastatic disease while  on systemic chemotherapy. This is a very poor prognostic sign.  Patient's options include participation in immunotherapy clinical trial with Pembrolizumab vs another line of chemotherapy with either gemcitabine or not will be  Patient is extremely keen on enrolling in the immunotherapy trial. In the interim she will continue with Xeloda.   No orders of the defined types were placed in this encounter.   The patient has a good understanding of the overall plan. she agrees with it. she will call with any problems that may develop before the next visit here.   Rulon Eisenmenger, MD 12/13/2015

## 2015-12-13 NOTE — Assessment & Plan Note (Signed)
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 Molli Posey on 08/21/2015 Adjuvant radiation therapy with concurrent Xeloda completed 10/23/2015 Adjuvant Xeloda 8 cycles (based on results from CREATE-X study) 2000 mg by mouth twice a day with cycle 2 -----------------------------------------------------------------------------------------------------------------------------------------------  Current treatment: Xeloda 200 mg daily PET CT scan 02/54/8628:[id number] hypermetabolic disease to the right axilla (largest 4.7 cm laminated mass), lungs (6 mm Rt, 1.8 cm LLL nodules), liver (6X 7.1 cm; 6.4 x 4.8 cm), abdominal lymph nodes ( 1cm porta hepatis LN, left peri-aortic RP LN 5 mm), left lamina of T12, bilateral femoral head AVN  Radiology review: I discussed the PET CT scan reporting great detail and provided her with a copy of this report. Unfortunately she has widespread metastatic disease while on systemic chemotherapy. This is a very poor prognostic sign. I discussed with her that based upon the rapid pace of her disease, she would not be alive for a long time.   Patient's options include participation in immunotherapy clinical trial with Pembrolizumab vs Hospice care

## 2015-12-14 ENCOUNTER — Encounter: Payer: Self-pay | Admitting: *Deleted

## 2015-12-14 LAB — GLUCOSE, CAPILLARY: GLUCOSE-CAPILLARY: 99 mg/dL (ref 65–99)

## 2015-12-14 NOTE — Progress Notes (Signed)
[place] Work  Clinical Social Work was referred by patient navigator for assessment of psychosocial needs and ADR completion request.  Clinical Social Worker contacted patient at home to offer support and assess for needs.  Pt eager to complete ADRs. CSW and pt have appt for 12/20/15 to complete ADRs. Pt very appreciative.   Loren Racer, [place] Worker [place]  [place] Phone: [phone] Fax: [phone]

## 2015-12-15 ENCOUNTER — Other Ambulatory Visit: Payer: Self-pay

## 2015-12-15 ENCOUNTER — Telehealth: Payer: Self-pay | Admitting: *Deleted

## 2015-12-15 DIAGNOSIS — C50212 Malignant neoplasm of upper-inner quadrant of left female breast: Secondary | ICD-10-CM

## 2015-12-15 MED ORDER — HYDROCODONE-ACETAMINOPHEN 5-325 MG PO TABS: 1.0000 | ORAL_TABLET | ORAL | Status: AC | PRN

## 2015-12-15 NOTE — Telephone Encounter (Signed)
Let pt know Rx ready to pick up before 4 pm today.  Pt voiced understanding.

## 2015-12-15 NOTE — Telephone Encounter (Signed)
Patient called requesting refill for Hydrocodone APAP 5-325 mg.  Return number when ready for pick up is [phone].  Please call.  Would like to pick up today due to snow in today's  weather forecast.

## 2015-12-18 NOTE — Telephone Encounter (Signed)
Call Documentation      Prentiss Bells, RN at 12/15/2015 11:18 AM     Status: Signed       Expand All Collapse All   Let pt know Rx ready to pick up before 4 pm today. Pt voiced understanding.

## 2015-12-19 ENCOUNTER — Telehealth: Payer: Self-pay

## 2015-12-19 ENCOUNTER — Telehealth: Payer: Self-pay | Admitting: *Deleted

## 2015-12-19 DIAGNOSIS — C50212 Malignant neoplasm of upper-inner quadrant of left female breast: Secondary | ICD-10-CM

## 2015-12-19 NOTE — Telephone Encounter (Signed)
"  I saw Dr. Lindi Adie on 12-13-2015 and was expecting a call by today in reference to if he has been able to get me enrolled in a clinical trial.  I called yesterday and have not received return call.  return number [phone].

## 2015-12-19 NOTE — Telephone Encounter (Signed)
LMVOM - advised pt study is not yet open.  Dr. Lindi Adie will advise when study is open and patient can enroll.

## 2015-12-20 ENCOUNTER — Encounter: Payer: Self-pay | Admitting: *Deleted

## 2015-12-20 ENCOUNTER — Telehealth: Payer: Self-pay

## 2015-12-20 NOTE — Telephone Encounter (Signed)
Call report rcvd from [facility] re: drug trial from sister, 12/19/15 5:06 pm.   Returned call to Ms Thorelow.  LMOVM - Advised - that writer had left a msg for patient yesterday regarding the drug trial and that the trial was not open yet, as such there is not a lot of information we can send to her.  Validated her concern that her sister is progressing on her current regimen and their concern as to what the next step would be.  Advised her to call clinic if she has further questions.

## 2015-12-20 NOTE — Progress Notes (Signed)
[place] Social Work  Clinical Social Work was referred by pt  to review and complete healthcare advance directives.  Clinical Social Worker met with patient and son, Rodman Key in [place] office.  The patient designated Iverson Alamin as their primary healthcare agent and son, Rodman Key as their secondary agent.  Patient also completed healthcare living will.    Clinical Social Worker notarized documents and made copies for patient/family. Clinical Social Worker will send documents to medical records to be scanned into patient's chart. Clinical Social Worker encouraged patient/family to contact with any additional questions or concerns.  Loren Racer, [place] Worker [place]  [place] Phone: [phone] Fax: [phone]

## 2015-12-20 NOTE — Telephone Encounter (Signed)
Msg rcvd from pt sister returning call.  Expressed concern about waiting for treatment.   Per Dr. Lindi Adie, trial not yet open.  If pt does not want to wait, we can have her come in to discuss options.  Attempted to call sister back - no answer - did not leave msg.  Contacted pt and discussed trial status with her.  Pt opts to come in and talk with Dr. Lindi Adie about treatment options.  Appt made for pt.    Pt reports fever ranging from 99-102 over the past 4-5 days, in the middle of the night is accompanied by shaking and chills.  She has been taking Advil.  Pt denies any nausea, vomiting, diarrhea, constipation, sore throat, headache, congestion, drainage, or colored sputum.  Reports occasional productive cough with clear/white sputum.  Let pt know we would add labs to her appt tomorrow and I would let Dr. Lindi Adie about the temp.  Confirmed appt with pt for 1/12 315 lab, 345 Dr. Lindi Adie.  Pt voiced understanding.    appts made, lab orders entered

## 2015-12-21 ENCOUNTER — Other Ambulatory Visit: Payer: Self-pay

## 2015-12-21 ENCOUNTER — Other Ambulatory Visit: Payer: Medicare Other

## 2015-12-21 ENCOUNTER — Encounter: Payer: Self-pay | Admitting: Hematology and Oncology

## 2015-12-21 ENCOUNTER — Ambulatory Visit: Payer: Medicare Other

## 2015-12-21 ENCOUNTER — Other Ambulatory Visit (HOSPITAL_BASED_OUTPATIENT_CLINIC_OR_DEPARTMENT_OTHER): Payer: Medicare Other

## 2015-12-21 ENCOUNTER — Encounter: Payer: No Typology Code available for payment source | Admitting: Nurse Practitioner

## 2015-12-21 ENCOUNTER — Ambulatory Visit (HOSPITAL_BASED_OUTPATIENT_CLINIC_OR_DEPARTMENT_OTHER): Payer: Medicare Other | Admitting: Hematology and Oncology

## 2015-12-21 ENCOUNTER — Ambulatory Visit: Payer: Medicare Other | Admitting: Hematology and Oncology

## 2015-12-21 ENCOUNTER — Telehealth: Payer: Self-pay | Admitting: Hematology and Oncology

## 2015-12-21 VITALS — BP 117/66 | HR 99 | Temp 97.7°F | Resp 19 | Wt 153.9 lb

## 2015-12-21 DIAGNOSIS — R509 Fever, unspecified: Secondary | ICD-10-CM

## 2015-12-21 DIAGNOSIS — R5081 Fever presenting with conditions classified elsewhere: Secondary | ICD-10-CM

## 2015-12-21 DIAGNOSIS — C773 Secondary and unspecified malignant neoplasm of axilla and upper limb lymph nodes: Secondary | ICD-10-CM | POA: Diagnosis not present

## 2015-12-21 DIAGNOSIS — Z171 Estrogen receptor negative status [ER-]: Secondary | ICD-10-CM

## 2015-12-21 DIAGNOSIS — C50212 Malignant neoplasm of upper-inner quadrant of left female breast: Secondary | ICD-10-CM

## 2015-12-21 DIAGNOSIS — D709 Neutropenia, unspecified: Secondary | ICD-10-CM

## 2015-12-21 DIAGNOSIS — C7989 Secondary malignant neoplasm of other specified sites: Secondary | ICD-10-CM

## 2015-12-21 LAB — URINALYSIS, MICROSCOPIC - CHCC
Bilirubin (Urine): NEGATIVE
GLUCOSE UR CHCC: NEGATIVE mg/dL
KETONES: NEGATIVE mg/dL
LEUKOCYTE ESTERASE: NEGATIVE
Nitrite: NEGATIVE
PH: 6 (ref 4.6–8.0)
SPECIFIC GRAVITY, URINE: 1.015 (ref 1.003–1.035)
Urobilinogen, UR: 0.2 mg/dL (ref 0.2–1)

## 2015-12-21 LAB — COMPREHENSIVE METABOLIC PANEL
ALBUMIN: 3.3 g/dL — AB (ref 3.5–5.0)
ALT: 19 U/L (ref 0–55)
AST: 24 U/L (ref 5–34)
Alkaline Phosphatase: 100 U/L (ref 40–150)
Anion Gap: 11 mEq/L (ref 3–11)
BUN: 12.7 mg/dL (ref 7.0–26.0)
CO2: 24 meq/L (ref 22–29)
Calcium: 9.1 mg/dL (ref 8.4–10.4)
Chloride: 101 mEq/L (ref 98–109)
Creatinine: 0.8 mg/dL (ref 0.6–1.1)
EGFR: 74 mL/min/{1.73_m2} — AB (ref >90)
GLUCOSE: 107 mg/dL (ref 70–140)
Potassium: 3.7 mEq/L (ref 3.5–5.1)
SODIUM: 137 meq/L (ref 136–145)
TOTAL PROTEIN: 6.5 g/dL (ref 6.4–8.3)
Total Bilirubin: 1.42 mg/dL — ABNORMAL HIGH (ref 0.20–1.20)

## 2015-12-21 LAB — CBC WITH DIFFERENTIAL/PLATELET
BASO%: 0.5 % (ref 0.0–2.0)
BASOS ABS: 0 10*3/uL (ref 0.0–0.1)
EOS ABS: 0.2 10*3/uL (ref 0.0–0.5)
EOS%: 3.4 % (ref 0.0–7.0)
HCT: 37.5 % (ref 34.8–46.6)
HEMOGLOBIN: 12.6 g/dL (ref 11.6–15.9)
LYMPH#: 0.7 10*3/uL — AB (ref 0.9–3.3)
LYMPH%: 10.1 % — AB (ref 14.0–49.7)
MCH: 37.3 pg — ABNORMAL HIGH (ref 25.1–34.0)
MCHC: 33.7 g/dL (ref 31.5–36.0)
MCV: 110.7 fL — ABNORMAL HIGH (ref 79.5–101.0)
MONO#: 0.8 10*3/uL (ref 0.1–0.9)
MONO%: 11.9 % (ref 0.0–14.0)
NEUT%: 74.1 % (ref 38.4–76.8)
NEUTROS ABS: 5.2 10*3/uL (ref 1.5–6.5)
Platelets: 144 10*3/uL — ABNORMAL LOW (ref 145–400)
RBC: 3.39 10*6/uL — AB (ref 3.70–5.45)
RDW: 17.5 % — ABNORMAL HIGH (ref 11.2–14.5)
WBC: 7 10*3/uL (ref 3.9–10.3)

## 2015-12-21 MED ORDER — LIDOCAINE-PRILOCAINE 2.5-2.5 % EX CREA: TOPICAL_CREAM | CUTANEOUS | Status: DC

## 2015-12-21 MED ORDER — PROCHLORPERAZINE MALEATE 10 MG PO TABS: 10.0000 mg | ORAL_TABLET | Freq: Four times a day (QID) | ORAL | Status: DC | PRN

## 2015-12-21 MED ORDER — ONDANSETRON HCL 8 MG PO TABS: 8.0000 mg | ORAL_TABLET | Freq: Two times a day (BID) | ORAL | Status: DC

## 2015-12-21 MED ORDER — AMOXICILLIN-POT CLAVULANATE 875-125 MG PO TABS: 1.0000 | ORAL_TABLET | Freq: Two times a day (BID) | ORAL | Status: DC

## 2015-12-21 MED FILL — AMOX-CLAV 875-125 MG TABLET: 875-125 | 7 days supply | Qty: 14 | Fill #0

## 2015-12-21 NOTE — Progress Notes (Signed)
Patient Care Team: Antony Blackbird, MD as PCP - General (Family Medicine)  DIAGNOSIS: Breast cancer of upper-inner quadrant of left female breast [facility])   Staging form: Breast, AJCC 7th Edition     Clinical: Stage IIIB (T4b, N0, M0) - Signed by Rulon Eisenmenger, MD on 01/17/2015   SUMMARY OF ONCOLOGIC HISTORY:   Breast cancer of upper-inner quadrant of left female breast ([place])   12/27/2014 Initial Diagnosis Grade 3 invasive mammary cancer, one lymph node negative in left axilla, ER 0%, PR 0%, Ki-67 98%, HER-2 negative ratio 1.38   01/06/2015 Breast MRI Left breast large enhancing mass, evidence of necrosis, extending from posterior through the middle third of left breast, extending from chest wall to the dermis, close to pectoralis fascia extending 4.5 cm, several bilateral axillary lymph nodes   01/17/2015 - 05/30/2015 Neo-Adjuvant Chemotherapy Neoadjuvant chemotherapy with dose dense Adriamycin and Cytoxan followed by Taxol and carboplatin   01/24/2015 - 01/27/2015 Hospital Admission Neutropenic fever admission, no source identified   06/08/2015 Breast MRI Interval erosion of the known carcinoma within the superior left breast through the overlying skin. The majority of the mass is now exophytic, decrease in Left Axill LN   06/23/2015 Surgery Left simple mastectomy: Invasive ductal carcinoma grade 3; 6.5 cm, lymphovascular invasion, margins negative, 0/3 lymph nodes negative, T4b N0M0 stage IIIB pathologic staging, ER 0%, PR 0%, HER-2 negative, Ki-67 98%   08/21/2015 Relapse/Recurrence Chest wall recurrence excision positive for breast cancer HER-2 negative   09/05/2015 - 10/23/2015 Radiation Therapy Radiation with Xeloda 1000 mg by mouth twice a day   11/09/2015 -  Chemotherapy Xeloda 1500 bid increased to 2000 mg bid starting 11/30/15   11/24/2015 Mammogram Palpable right axillary lymph node: Mammogram and ultrasound revealed group of abnormal appearing lymph nodes largest measures 3.3 x 1.8 x 1.9 cm   12/12/2015 PET scan Extensive hypermetabolic disease to the right axilla, lungs, liver, abdominal lymph nodes, left lamina of T12, bilateral femoral head AVN    CHIEF COMPLIANT: fevers intermittently  INTERVAL HISTORY: Patty Buckley is a 65 year old with above-mentioned history metastatic breast cancer who is here because she has noticed intermittent temperature along with sweating. She had noticed a temperature got as high as 102. Today she has not had any fever. It appears that she has profuse sweats sometimes in the middle of the night. She does not have any cough or expectoration. She does not have any surgical scar tissues. Denies any burning urination. She did have some gastrointestinal upset with some nausea.  REVIEW OF SYSTEMS:   Constitutional: Denies fevers, chills or abnormal weight loss Eyes: Denies blurriness of vision Ears, nose, mouth, throat, and face: Denies mucositis or sore throat Respiratory: Denies cough, dyspnea or wheezes Cardiovascular: Denies palpitation, chest discomfort Gastrointestinal:  Denies nausea, heartburn or change in bowel habits Skin: Denies abnormal skin rashes Lymphatics: Denies new lymphadenopathy or easy bruising Neurological:Denies numbness, tingling or new weaknesses Behavioral/Psych: Mood is stable, no new changes  Extremities: No lower extremity edema Breasts: Big tender nodules in the right axilla from enlarged lymph nodes All other systems were reviewed with the patient and are negative.  I have reviewed the past medical history, past surgical history, social history and family history with the patient and they are unchanged from previous note.  ALLERGIES:  is allergic to codeine.  MEDICATIONS:  Current Outpatient Prescriptions  Medication Sig Dispense Refill  . amoxicillin-clavulanate (AUGMENTIN) 875-125 MG tablet Take 1 tablet by mouth 2 (two) times daily. [place]  tablet 0  . aspirin 81 MG tablet Take 81 mg by mouth daily.      .  capecitabine (XELODA) 500 MG tablet Take 4 tablets (2,000 mg total) by mouth 2 (two) times daily after a meal. For 14 days, then take 7 days off.  Cycle 1 112 tablet 0  . HYDROcodone-acetaminophen (NORCO/VICODIN) 5-325 MG tablet Take 1-2 tablets by mouth every 4 (four) hours as needed for moderate pain. 60 tablet 0  . lidocaine-prilocaine (EMLA) cream Apply to affected area once 30 g 3  . metoprolol (LOPRESSOR) 50 MG tablet Take 1 tablet (50 mg total) by mouth 2 (two) times daily. 180 tablet 3  . Multiple Vitamin (MULTIVITAMIN) tablet Take 1 tablet by mouth daily.     . ondansetron (ZOFRAN) 8 MG tablet Take 1 tablet (8 mg total) by mouth 2 (two) times daily. Start the day after chemo for 2 days. Then take as needed for nausea or vomiting. 30 tablet 1  . prochlorperazine (COMPAZINE) 10 MG tablet Take 1 tablet (10 mg total) by mouth every 6 (six) hours as needed (Nausea or vomiting). 30 tablet 1   No current facility-administered medications for this visit.    PHYSICAL EXAMINATION: ECOG PERFORMANCE STATUS: 1 - Symptomatic but completely ambulatory  Filed Vitals:   12/21/15 1517  BP: 117/66  Pulse: 99  Temp: 97.7 F (36.5 C)  Resp: 19   Filed Weights   12/21/15 1517  Weight: 153 lb 14.4 oz (69.809 kg)    GENERAL:alert, no distress and comfortable SKIN: skin color, texture, turgor are normal, no rashes or significant lesions EYES: normal, Conjunctiva are pink and non-injected, sclera clear OROPHARYNX:no exudate, no erythema and lips, buccal mucosa, and tongue normal  NECK: supple, thyroid normal size, non-tender, without nodularity LYMPH:  no palpable lymphadenopathy in the cervical, axillary or inguinal LUNGS: clear to auscultation and percussion with normal breathing effort HEART: regular rate & rhythm and no murmurs and no lower extremity edema ABDOMEN:abdomen soft, non-tender and normal bowel sounds MUSCULOSKELETAL:no cyanosis of digits and no clubbing  NEURO: alert & oriented x  3 with fluent speech, no focal motor/sensory deficits EXTREMITIES: No lower extremity edema BREAST:large palpable lymphadenopathy in the right axilla (exam performed in the presence of a chaperone)  LABORATORY DATA:  I have reviewed the data as listed   Chemistry      Component Value Date/Time   NA 137 12/21/2015 1506   NA 137 06/24/2015 0550   K 3.7 12/21/2015 1506   K 4.2 06/24/2015 0550   CL 106 06/24/2015 0550   CO2 24 12/21/2015 1506   CO2 24 06/24/2015 0550   BUN 12.7 12/21/2015 1506   BUN 7 06/24/2015 0550   CREATININE 0.8 12/21/2015 1506   CREATININE 0.73 06/24/2015 0550      Component Value Date/Time   CALCIUM 9.1 12/21/2015 1506   CALCIUM 8.3* 06/24/2015 0550   ALKPHOS 100 12/21/2015 1506   ALKPHOS 58 06/24/2015 0550   AST 24 12/21/2015 1506   AST 16 06/24/2015 0550   ALT 19 12/21/2015 1506   ALT 18 06/24/2015 0550   BILITOT 1.42* 12/21/2015 1506   BILITOT 0.2* 06/24/2015 0550       Lab Results  Component Value Date   WBC 7.0 12/21/2015   HGB 12.6 12/21/2015   HCT 37.5 12/21/2015   MCV 110.7* 12/21/2015   PLT 144* 12/21/2015   NEUTROABS 5.2 12/21/2015     ASSESSMENT & PLAN:  Breast cancer of upper-inner quadrant of left  female breast 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 Molli Posey on 08/21/2015 Adjuvant radiation therapy with concurrent Xeloda completed 10/23/2015 Xeloda (based on results from CREATE-X study) 2000 mg by mouth twice a day discontinued 12/14/2015 PET CT scan  56/21/3086:[id number] hypermetabolic disease to the right axilla (largest 4.7 cm laminated mass), lungs (6 mm Rt, 1.8 cm LLL nodules), liver (6X 7.1 cm; 6.4 x 4.8 cm), abdominal lymph nodes ( 1cm porta hepatis LN, left peri-aortic RP LN 5 mm), left lamina of T12, bilateral femoral head AVN -----------------------------------------------------------------------------------------------------------------------------------------------  Plan to treatment: Halaven days 1 and 8 q 3 weeks to start 12/26/15 Recurrent fevers: We obtained blood cultures and urine analysis. We will start her on antibiotic with Augmentin. Clinical examination did not reveal any surgical scar infections.  Patient wanted to go on the Pembrolizumab clinical trial but because the trial is taking time to open, we decided to start chemotherapy next week.  Port will be placed on Tuesday and chemotherapy will start on Tuesday as well if we get prior authorization. Return to clinic on cycle 1 day 8 for toxicity check and follow      Orders Placed This Encounter  Procedures  . Urine culture    Standing Status: Future     Number of Occurrences: 1     Standing Expiration Date: 12/20/2016  . Blood culture (routine single)    Standing Status: Future     Number of Occurrences: 1     Standing Expiration Date: 12/20/2016  . Blood culture (routine single)    Standing Status: Future     Number of Occurrences: 1     Standing Expiration Date: 12/20/2016  . IR Fluoro Guide CV Line Right    Power port a cath.  12/26/15 8 am    Standing Status: Future     Number of Occurrences:      Standing Expiration Date: 02/17/2017    Order Specific Question:  Reason for exam:    Answer:  Port placement    Order Specific Question:  Preferred Imaging Location?    Answer:  [facility]  . Urinalysis, Microscopic - [facility]    Standing Status: Future     Number of Occurrences: 1     Standing Expiration Date: 12/20/2016  . CBC with Differential     Standing Status: Standing     Number of Occurrences: 20     Standing Expiration Date: 12/21/2016  . Comprehensive metabolic panel    Standing Status: Standing     Number of Occurrences: 20     Standing Expiration Date: 12/21/2016   The patient has a good understanding of the overall plan. she agrees with it. she will call with any problems that may develop before the next visit here.   Rulon Eisenmenger, MD 12/21/2015

## 2015-12-21 NOTE — Addendum Note (Signed)
Addended by: Prentiss Bells on: 12/21/2015 06:09 PM   Modules accepted: Medications

## 2015-12-21 NOTE — Telephone Encounter (Signed)
Spoke with patient and she is aware of her appointments,email to michelle to see if we havwe a sooner time on 1/17 after port placement

## 2015-12-21 NOTE — Assessment & Plan Note (Signed)
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 Molli Posey on 08/21/2015 Adjuvant radiation therapy with concurrent Xeloda completed 10/23/2015 Xeloda (based on results from CREATE-X study) 2000 mg by mouth twice a day discontinued 12/14/2015 PET CT scan 59/08/3111:[id number] hypermetabolic disease to the right axilla (largest 4.7 cm laminated mass), lungs (6 mm Rt, 1.8 cm LLL nodules), liver (6X 7.1 cm; 6.4 x 4.8 cm), abdominal lymph nodes ( 1cm porta hepatis LN, left peri-aortic RP LN 5 mm), left lamina of T12, bilateral femoral head AVN -----------------------------------------------------------------------------------------------------------------------------------------------  Plan to treatment: Halaven days 1 and 8 q 3 weeks to start 12/26/15 Recurrent fevers: We obtained blood cultures and urine analysis. We will start her on antibiotic with Augmentin. Clinical examination did not reveal any surgical scar infections.  Patient wanted to go on the Pembrolizumab clinical trial but because the trial is taking time to open, we decided to start chemotherapy next week.  Port will be placed on Tuesday and chemotherapy will start on Tuesday as well if we get prior authorization. Return to clinic on cycle 1 day 8 for toxicity check and follow

## 2015-12-22 LAB — URINE CULTURE

## 2015-12-25 ENCOUNTER — Other Ambulatory Visit: Payer: Self-pay | Admitting: Radiology

## 2015-12-26 ENCOUNTER — Encounter (HOSPITAL_COMMUNITY): Payer: Self-pay

## 2015-12-26 ENCOUNTER — Ambulatory Visit (HOSPITAL_COMMUNITY)
Admission: RE | Admit: 2015-12-26 | Discharge: 2015-12-26 | Disposition: A | Discharge status: Home / Self Care | Payer: Medicare Other | Source: Ambulatory Visit | Attending: Hematology and Oncology | Admitting: Hematology and Oncology

## 2015-12-26 ENCOUNTER — Ambulatory Visit (HOSPITAL_BASED_OUTPATIENT_CLINIC_OR_DEPARTMENT_OTHER): Payer: Medicare Other

## 2015-12-26 ENCOUNTER — Other Ambulatory Visit: Payer: Self-pay | Admitting: Hematology and Oncology

## 2015-12-26 VITALS — BP 102/66 | HR 99 | Temp 98.4°F | Resp 16

## 2015-12-26 DIAGNOSIS — C773 Secondary and unspecified malignant neoplasm of axilla and upper limb lymph nodes: Secondary | ICD-10-CM | POA: Diagnosis not present

## 2015-12-26 DIAGNOSIS — I10 Essential (primary) hypertension: Secondary | ICD-10-CM | POA: Diagnosis not present

## 2015-12-26 DIAGNOSIS — I2510 Atherosclerotic heart disease of native coronary artery without angina pectoris: Secondary | ICD-10-CM | POA: Diagnosis not present

## 2015-12-26 DIAGNOSIS — M8580 Other specified disorders of bone density and structure, unspecified site: Secondary | ICD-10-CM | POA: Insufficient documentation

## 2015-12-26 DIAGNOSIS — F17210 Nicotine dependence, cigarettes, uncomplicated: Secondary | ICD-10-CM | POA: Insufficient documentation

## 2015-12-26 DIAGNOSIS — C7989 Secondary malignant neoplasm of other specified sites: Secondary | ICD-10-CM | POA: Diagnosis not present

## 2015-12-26 DIAGNOSIS — C50212 Malignant neoplasm of upper-inner quadrant of left female breast: Secondary | ICD-10-CM

## 2015-12-26 DIAGNOSIS — Z8249 Family history of ischemic heart disease and other diseases of the circulatory system: Secondary | ICD-10-CM | POA: Diagnosis not present

## 2015-12-26 DIAGNOSIS — Z7982 Long term (current) use of aspirin: Secondary | ICD-10-CM | POA: Insufficient documentation

## 2015-12-26 DIAGNOSIS — I517 Cardiomegaly: Secondary | ICD-10-CM | POA: Diagnosis not present

## 2015-12-26 DIAGNOSIS — E559 Vitamin D deficiency, unspecified: Secondary | ICD-10-CM | POA: Diagnosis not present

## 2015-12-26 DIAGNOSIS — D709 Neutropenia, unspecified: Secondary | ICD-10-CM

## 2015-12-26 DIAGNOSIS — E785 Hyperlipidemia, unspecified: Secondary | ICD-10-CM | POA: Diagnosis not present

## 2015-12-26 DIAGNOSIS — C50912 Malignant neoplasm of unspecified site of left female breast: Secondary | ICD-10-CM | POA: Diagnosis not present

## 2015-12-26 DIAGNOSIS — M1990 Unspecified osteoarthritis, unspecified site: Secondary | ICD-10-CM | POA: Diagnosis not present

## 2015-12-26 DIAGNOSIS — R5081 Fever presenting with conditions classified elsewhere: Secondary | ICD-10-CM

## 2015-12-26 DIAGNOSIS — J42 Unspecified chronic bronchitis: Secondary | ICD-10-CM | POA: Diagnosis not present

## 2015-12-26 DIAGNOSIS — N281 Cyst of kidney, acquired: Secondary | ICD-10-CM | POA: Diagnosis not present

## 2015-12-26 DIAGNOSIS — Z800 Family history of malignant neoplasm of digestive organs: Secondary | ICD-10-CM | POA: Diagnosis not present

## 2015-12-26 DIAGNOSIS — R011 Cardiac murmur, unspecified: Secondary | ICD-10-CM | POA: Insufficient documentation

## 2015-12-26 LAB — CBC WITH DIFFERENTIAL/PLATELET
BASOS ABS: 0 10*3/uL (ref 0.0–0.1)
BASOS PCT: 0 %
EOS ABS: 0.1 10*3/uL (ref 0.0–0.7)
EOS PCT: 2 %
HCT: 37.4 % (ref 36.0–46.0)
Hemoglobin: 12.5 g/dL (ref 12.0–15.0)
LYMPHS PCT: 12 %
Lymphs Abs: 0.6 10*3/uL — ABNORMAL LOW (ref 0.7–4.0)
MCH: 36.2 pg — ABNORMAL HIGH (ref 26.0–34.0)
MCHC: 33.4 g/dL (ref 30.0–36.0)
MCV: 108.4 fL — AB (ref 78.0–100.0)
MONO ABS: 0.6 10*3/uL (ref 0.1–1.0)
Monocytes Relative: 11 %
NEUTROS ABS: 4 10*3/uL (ref 1.7–7.7)
NEUTROS PCT: 75 %
PLATELETS: 189 10*3/uL (ref 150–400)
RBC: 3.45 MIL/uL — ABNORMAL LOW (ref 3.87–5.11)
RDW: 16 % — AB (ref 11.5–15.5)
WBC: 5.4 10*3/uL (ref 4.0–10.5)

## 2015-12-26 LAB — APTT: APTT: 27 s (ref 24–37)

## 2015-12-26 LAB — PROTIME-INR
INR: 1.25 (ref 0.00–1.49)
Prothrombin Time: 15.9 seconds — ABNORMAL HIGH (ref 11.6–15.2)

## 2015-12-26 MED ORDER — PALONOSETRON HCL INJECTION 0.25 MG/5ML
INTRAVENOUS | Status: AC
  Filled 2015-12-26: qty 5

## 2015-12-26 MED ORDER — CEFAZOLIN SODIUM-DEXTROSE 2-3 GM-% IV SOLR
2.0000 g | Freq: Once | INTRAVENOUS | Status: AC
  Administered 2015-12-26: 2 g via INTRAVENOUS

## 2015-12-26 MED ORDER — HEPARIN SOD (PORK) LOCK FLUSH 100 UNIT/ML IV SOLN
INTRAVENOUS | Status: AC
Start: 2015-12-26 — End: 2015-12-26
  Filled 2015-12-26: qty 5

## 2015-12-26 MED ORDER — SODIUM CHLORIDE 0.9 % IV SOLN
INTRAVENOUS | Status: DC
  Administered 2015-12-26: 08:00:00 via INTRAVENOUS

## 2015-12-26 MED ORDER — MIDAZOLAM HCL 2 MG/2ML IJ SOLN
INTRAMUSCULAR | Status: AC
  Filled 2015-12-26: qty 4

## 2015-12-26 MED ORDER — CEFAZOLIN SODIUM-DEXTROSE 2-3 GM-% IV SOLR
INTRAVENOUS | Status: AC
  Filled 2015-12-26: qty 50

## 2015-12-26 MED ORDER — HEPARIN SOD (PORK) LOCK FLUSH 100 UNIT/ML IV SOLN
500.0000 [IU] | Freq: Once | INTRAVENOUS | Status: AC | PRN
  Administered 2015-12-26: 500 [IU]
  Filled 2015-12-26: qty 5

## 2015-12-26 MED ORDER — FENTANYL CITRATE (PF) 100 MCG/2ML IJ SOLN
INTRAMUSCULAR | Status: AC | PRN
  Administered 2015-12-26 (×2): 25 ug via INTRAVENOUS
  Administered 2015-12-26: 50 ug via INTRAVENOUS

## 2015-12-26 MED ORDER — FENTANYL CITRATE (PF) 100 MCG/2ML IJ SOLN
INTRAMUSCULAR | Status: AC
  Filled 2015-12-26: qty 2

## 2015-12-26 MED ORDER — SODIUM CHLORIDE 0.9 % IJ SOLN
10.0000 mL | INTRAMUSCULAR | Status: DC | PRN
  Administered 2015-12-26: 10 mL
  Filled 2015-12-26: qty 10

## 2015-12-26 MED ORDER — MIDAZOLAM HCL 2 MG/2ML IJ SOLN
INTRAMUSCULAR | Status: AC | PRN
  Administered 2015-12-26: 1 mg via INTRAVENOUS
  Administered 2015-12-26: 0.5 mg via INTRAVENOUS
  Administered 2015-12-26: 1 mg via INTRAVENOUS
  Administered 2015-12-26: 0.5 mg via INTRAVENOUS

## 2015-12-26 MED ORDER — LIDOCAINE HCL 1 % IJ SOLN
INTRAMUSCULAR | Status: AC
  Filled 2015-12-26: qty 20

## 2015-12-26 MED ORDER — PALONOSETRON HCL INJECTION 0.25 MG/5ML
0.2500 mg | Freq: Once | INTRAVENOUS | Status: AC
  Administered 2015-12-26: 0.25 mg via INTRAVENOUS

## 2015-12-26 MED ORDER — SODIUM CHLORIDE 0.9 % IV SOLN
Freq: Once | INTRAVENOUS | Status: AC
Start: 2015-12-26 — End: 2015-12-26
  Administered 2015-12-26: 11:00:00 via INTRAVENOUS

## 2015-12-26 MED ORDER — DEXAMETHASONE SODIUM PHOSPHATE 100 MG/10ML IJ SOLN
10.0000 mg | Freq: Once | INTRAMUSCULAR | Status: AC
  Administered 2015-12-26: 10 mg via INTRAVENOUS
  Filled 2015-12-26: qty 1

## 2015-12-26 MED ORDER — SODIUM CHLORIDE 0.9 % IV SOLN
1.4000 mg/m2 | Freq: Once | INTRAVENOUS | Status: AC
  Administered 2015-12-26: 2.45 mg via INTRAVENOUS
  Filled 2015-12-26: qty 4.9

## 2015-12-26 NOTE — Procedures (Signed)
RIJV PAC SVC RA No comp/EBL 

## 2015-12-26 NOTE — Patient Instructions (Addendum)
[facility] Discharge Instructions for Patients Receiving Chemotherapy  Today you received the following chemotherapy agents:  Haloven.  To help prevent nausea and vomiting after your treatment, we encourage you to take your nausea medication:  Compazine 10 mg every 6 hours as needed.   If you develop nausea and vomiting that is not controlled by your nausea medication, call the clinic.   BELOW ARE SYMPTOMS THAT SHOULD BE REPORTED IMMEDIATELY:  *FEVER GREATER THAN 100.5 F  *CHILLS WITH OR WITHOUT FEVER  NAUSEA AND VOMITING THAT IS NOT CONTROLLED WITH YOUR NAUSEA MEDICATION  *UNUSUAL SHORTNESS OF BREATH  *UNUSUAL BRUISING OR BLEEDING  TENDERNESS IN MOUTH AND THROAT WITH OR WITHOUT PRESENCE OF ULCERS  *URINARY PROBLEMS  *BOWEL PROBLEMS  UNUSUAL RASH Items with * indicate a potential emergency and should be followed up as soon as possible.  Feel free to call the clinic you have any questions or concerns. The clinic phone number is [phone].  Please show the [place] [facility] at check-in to the Emergency Department and triage nurse.  Implanted [facility] Guide An implanted port is a type of central line that is placed under the skin. Central lines are used to provide IV access when treatment or nutrition needs to be given through a person's veins. Implanted ports are used for long-term IV access. An implanted port may be placed because:   You need IV medicine that would be irritating to the small veins in your hands or arms.   You need long-term IV medicines, such as antibiotics.   You need IV nutrition for a long period.   You need frequent blood draws for lab tests.   You need dialysis.  Implanted ports are usually placed in the chest area, but they can also be placed in the upper arm, the abdomen, or the leg. An implanted port has two main parts:   Reservoir. The reservoir is round and will appear as a small, raised area under your skin. The  reservoir is the part where a needle is inserted to give medicines or draw blood.   Catheter. The catheter is a thin, flexible tube that extends from the reservoir. The catheter is placed into a large vein. Medicine that is inserted into the reservoir goes into the catheter and then into the vein.  HOW WILL I CARE FOR MY INCISION SITE? Do not get the incision site wet. Bathe or shower as directed by your health care provider.  HOW IS MY PORT ACCESSED? Special steps must be taken to access the port:   Before the port is accessed, a numbing cream can be placed on the skin. This helps numb the skin over the port site.   Your health care provider uses a sterile technique to access the port.  Your health care provider must put on a mask and sterile gloves.  The skin over your port is cleaned carefully with an antiseptic and allowed to dry.  The port is gently pinched between sterile gloves, and a needle is inserted into the port.  Only "non-coring" port needles should be used to access the port. Once the port is accessed, a blood return should be checked. This helps ensure that the port is in the vein and is not clogged.   If your port needs to remain accessed for a constant infusion, a clear (transparent) bandage will be placed over the needle site. The bandage and needle will need to be changed every week, or as directed by  your health care provider.   Keep the bandage covering the needle clean and dry. Do not get it wet. Follow your health care provider's instructions on how to take a shower or bath while the port is accessed.   If your port does not need to stay accessed, no bandage is needed over the port.  WHAT IS FLUSHING? Flushing helps keep the port from getting clogged. Follow your health care provider's instructions on how and when to flush the port. Ports are usually flushed with saline solution or a medicine called heparin. The need for flushing will depend on how the port is  used.   If the port is used for intermittent medicines or blood draws, the port will need to be flushed:   After medicines have been given.   After blood has been drawn.   As part of routine maintenance.   If a constant infusion is running, the port may not need to be flushed.  HOW LONG WILL MY PORT STAY IMPLANTED? The port can stay in for as long as your health care provider thinks it is needed. When it is time for the port to come out, surgery will be done to remove it. The procedure is similar to the one performed when the port was put in.  WHEN SHOULD I SEEK IMMEDIATE MEDICAL CARE? When you have an implanted port, you should seek immediate medical care if:   You notice a bad smell coming from the incision site.   You have swelling, redness, or drainage at the incision site.   You have more swelling or pain at the port site or the surrounding area.   You have a fever that is not controlled with medicine.   This information is not intended to replace advice given to you by your health care provider. Make sure you discuss any questions you have with your health care provider.   Document Released: 11/25/2005 Document Revised: 09/15/2013 Document Reviewed: 08/02/2013 Elsevier Interactive Patient Education [employer].

## 2015-12-26 NOTE — H&P (Signed)
Chief Complaint: Placement of Port A Cath  Referring Physician(s): Gudena,Vinay  History of Present Illness: Patty Buckley is a 65 y.o. female with Grade 3 invasive breast cancer which was diagnosed last year.  She is he here today for placement of a Port A cath for chemotherapy.  She has had one in the past, it was done in March 2016 by Dr. Pascal Lux, and it was removed after completion of therapy.  She underwent a left simple mastectomy in July and unfortunately had a chest wall recurrence in September.  She was started on Xeloda in December.  Then mammogram done 11/24/15 revealed abnormal lymph nodes.  Pet scan done 12/12/2015 showed extensive hypermetabolic disease to the right axilla, lungs, liver, abdominal nodes, left lamina of T12, and bilateral femoral head.  She does report recent fever and chills and is being worked up by her Oncologist. He has drawn blood cultures (on 12/21/15) which show no growth to date and she is currently on antibiotics.  She feels ok today. She denies cough or urinary symptoms.  She is NPO. She takes on 81 mg aspirin.   Past Medical History  Diagnosis Date  . Hyperlipidemia   . Left ventricular outflow obstruction   . Cigarette smoker   . Vitamin D deficiency   . Osteopenia   . Hemorrhoids   . Ventricular hypertrophy   . Wears glasses   . Heart murmur   . Hypertension     metoprolol  . Inflammation of finger or toe     pt. states fingers  . Hernia     umbilical  . Pneumonia [id number] X ?2  . Chronic bronchitis ([place])     "almost q yr;  usually in the fall" (06/23/2015)  . Cancer of left breast ([place])     mastectomy 06/23/2015  . Arthritis     "joint pain in my fingers" (06/23/2015)    Past Surgical History  Procedure Laterality Date  . Tonsillectomy    . Tubal ligation    . US echocardiography  04/24/2007    EF 55-60%  . Cardiovascular stress test  05/07/2007    EF 82% NO ISCHEMIA  . Wisdom tooth extraction    . Portacath  placement Right 01/11/2015    Procedure: INSERTION PORT-A-CATH;  Surgeon: Donnie Mesa, MD;  Location: [place];  Service: General;  Laterality: Right;     Attempted  failed to implant,   . Colonoscopy    . Mastectomy Left 06/23/2015  . Port-a-cath removal  06/23/2015  . Breast biopsy Left ~ 01/2015  . Peripheral vascular catheterization Right 06/23/2015    Procedure: PORTA CATH REMOVAL;  Surgeon: Donnie Mesa, MD;  Location: [place];  Service: General;  Laterality: Right;  . Total mastectomy Left 06/23/2015    Procedure: LEFT MASTECTOMY;  Surgeon: Donnie Mesa, MD;  Location: [place];  Service: General;  Laterality: Left;    Allergies: Codeine  Medications: Prior to Admission medications   Medication Sig Start Date End Date Taking? Authorizing Provider  amoxicillin-clavulanate (AUGMENTIN) 875-125 MG tablet Take 1 tablet by mouth 2 (two) times daily. 12/21/15  Yes Nicholas Lose, MD  capecitabine (XELODA) 500 MG tablet Take 4 tablets (2,000 mg total) by mouth 2 (two) times daily after a meal. For 14 days, then take 7 days off.  Cycle 1 11/30/15  Yes Nicholas Lose, MD  HYDROcodone-acetaminophen (NORCO/VICODIN) 5-325 MG tablet Take 1-2 tablets by mouth every 4 (four) hours as needed for moderate pain. 12/15/15  Yes Nicholas Lose, MD  metoprolol (LOPRESSOR) 50 MG tablet Take 1 tablet (50 mg total) by mouth 2 (two) times daily. 03/07/15  Yes Nicholas Lose, MD  Multiple Vitamin (MULTIVITAMIN) tablet Take 1 tablet by mouth daily.    Yes Historical Provider, MD  aspirin 81 MG tablet Take 81 mg by mouth daily.      Historical Provider, MD  lidocaine-prilocaine (EMLA) cream Apply to affected area once 12/21/15   Nicholas Lose, MD  ondansetron (ZOFRAN) 8 MG tablet Take 1 tablet (8 mg total) by mouth 2 (two) times daily. Start the day after chemo for 2 days. Then take as needed for nausea or vomiting. 12/21/15   Nicholas Lose, MD  prochlorperazine (COMPAZINE) 10 MG tablet Take 1 tablet (10 mg total) by  mouth every 6 (six) hours as needed (Nausea or vomiting). 12/21/15   Nicholas Lose, MD     Family History  Problem Relation Age of Onset  . Hypertension Mother   . Heart attack Mother   . Heart failure Father   . Hypertension Father   . Cancer Paternal Aunt 24    Colon Cancer    Social History   Social History  . Marital Status: Divorced    Spouse Name: N/A  . Number of Children: N/A  . Years of Education: N/A   Social History Main Topics  . Smoking status: Current Every Day Smoker -- 0.50 packs/day for 45 years    Types: Cigarettes  . Smokeless tobacco: Never Used  . Alcohol Use: 1.2 oz/week    2 Glasses of wine per week  . Drug Use: No  . Sexual Activity: No   Other Topics Concern  . None   Social History Narrative    Review of Systems  Constitutional: Positive for fever, chills and fatigue. Negative for activity change and appetite change.  HENT: Negative.   Respiratory: Negative for cough, shortness of breath and wheezing.   Cardiovascular: Negative for chest pain.  Gastrointestinal: Negative for nausea, vomiting, abdominal pain and abdominal distention.  Genitourinary: Negative.   Musculoskeletal: Negative.   Skin: Negative.   Neurological: Negative.   Psychiatric/Behavioral: Negative.     Vital Signs: Pulse 101 124/55 Afebrile   Physical Exam  Constitutional: She is oriented to person, place, and time. She appears well-developed and well-nourished.  HENT:  Head: Normocephalic and atraumatic.  Eyes: EOM are normal.  Neck: Normal range of motion. Neck supple.  Cardiovascular: Normal rate, regular rhythm and normal heart sounds.   No murmur heard. Pulmonary/Chest: Effort normal and breath sounds normal. No respiratory distress. She has no wheezes.  Abdominal: Soft. Bowel sounds are normal. She exhibits no distension. There is no tenderness.  Musculoskeletal: Normal range of motion.  Neurological: She is alert and oriented to person, place, and time.    Skin: Skin is warm and dry.  Psychiatric: She has a normal mood and affect. Her behavior is normal. Judgment and thought content normal.  Vitals reviewed.   Mallampati Score:  MD Evaluation Airway: WNL Heart: WNL Abdomen: WNL Chest/ Lungs: WNL ASA  Classification: 3 Mallampati/Airway Score: One  Imaging: Nm Pet Image Initial (pi) Skull Base To Thigh  12/12/2015  CLINICAL DATA:  Initial treatment strategy for new right axillary nodal metastasis. History of left breast cancer with mastectomy and axillary node dissection in June of 2016. EXAM: NUCLEAR MEDICINE PET SKULL BASE TO THIGH TECHNIQUE: 9.4 mCi F-18 FDG was injected intravenously. Full-ring PET imaging was performed from the skull base to thigh  after the radiotracer. CT data was obtained and used for attenuation correction and anatomic localization. FASTING BLOOD GLUCOSE:  Value: 99 mg/dl COMPARISON:  Breast MR of 06/08/2015. Abdominal pelvic CT of 01/16/2015. Chest CT of 01/16/2015. No prior PET. FINDINGS: NECK No areas of abnormal hypermetabolism. CHEST Development of multiple hypermetabolic right axillary nodes. Index node or nodal conglomerate measures 3.4 x 4.7 cm and a S.U.V. max of 29.3 on image 61/series 4. Bilateral hypermetabolic pulmonary nodules which are new since the prior CT, consistent with metastatic disease. Example right apical 6 mm nodule which measures a S.U.V. max of 3.6 on image 13/series 6. Left lower lobe nodule which measures 1.6 x 1.8 cm and a S.U.V. max of 14.8 on image 52/series 6. ABDOMEN/PELVIS Extensive hypermetabolic hepatic metastasis. Infiltrative hepatic dome mass is ill-defined on CT, measuring 6.0 x 7.1 cm and a S.U.V. max of 35.4 on image 88/ series 4. Mass centered in segment 4B is also ill-defined on CT. Measures on the order of 6.4 x 4.8 cm and a S.U.V. max of 34.9 including on image 108/series 4. Upper abdominal nodal metastasis, including 1.0 cm porta hepatis node which measures a S.U.V. max of 5.5.  A left periaortic retroperitoneal node measures 5 mm and a S.U.V. max of 5.9 on image 110/series 2. SKELETON Left lamina osseous metastasis at the T12 level is not CT apparent. This measures a S.U.V. max of 22.5. CT IMAGES PERFORMED FOR ATTENUATION CORRECTION No cervical adenopathy. Normal heart size with multivessel coronary artery atherosclerosis. Left mastectomy and axillary node dissection. Small hiatal hernia. Left renal cyst. Advanced aortic and branch vessel atherosclerosis. Mild pelvic floor laxity. Bilateral femoral head avascular necrosis. Remote bilateral rib trauma. IMPRESSION: 1. Extensive hypermetabolic metastatic disease, including to the right axilla, lungs, liver, abdominal lymph nodes, and left lamina of T12. 2. Left mastectomy, without locally recurrent or left axillary nodal metastasis. 3.  Atherosclerosis, including within the coronary arteries. 4. Bilateral femoral head avascular necrosis, chronic. Electronically Signed   By: Abigail Miyamoto M.D.   On: 12/12/2015 15:58    Labs:  CBC:  Recent Labs  11/16/15 1027 12/13/15 1342 12/21/15 1506 12/26/15 0720  WBC 4.2 5.0 7.0 5.4  HGB 13.1 12.5 12.6 12.5  HCT 39.6 36.9 37.5 37.4  PLT 241 171 144* 189    COAGS:  Recent Labs  01/13/15 1242 02/07/15 1210 02/13/15 1130 12/26/15 0720  INR 0.94 0.95 0.97 1.25  APTT 26  --  27 27    BMP:  Recent Labs  01/25/15 0500  02/13/15 1130  06/22/15 1333 06/23/15 1230 06/24/15 0550  10/13/15 1112 11/16/15 1027 12/13/15 1342 12/21/15 1506  NA 138  < > 140  < > 136  --  137  < > 140 140 137 137  K 4.2  < > 4.3  < > 3.9  --  4.2  < > 4.8 4.5 4.0 3.7  CL 107  --  107  --  101  --  106  --   --   --   --   --   CO2 25  < > 25  < > 25  --  24  < > 27 23 25 24   GLUCOSE 124*  < > 108*  < > 132*  --  135*  < > 113 118 140 107  BUN 8  < > 8  < > 7  --  7  < > 12.0 10.9 10.9 12.7  CALCIUM 8.3*  < > 9.0  < >  9.9  --  8.3*  < > 10.1 9.6 9.3 9.1  CREATININE 0.67  < > 0.74  < > 0.74  0.75 0.73  < > 0.8 0.8 0.8 0.8  GFRNONAA >90  --  88*  --  >60 >60 >60  --   --   --   --   --   GFRAA >90  --  >90  --  >60 >60 >60  --   --   --   --   --   < > = values in this interval not displayed.  LIVER FUNCTION TESTS:  Recent Labs  10/13/15 1112 11/16/15 1027 12/13/15 1342 12/21/15 1506  BILITOT 0.78 0.57 1.09 1.42*  AST 16 20 19 24   ALT 12 12 16 19   ALKPHOS 89 84 101 100  PROT 6.5 6.6 6.2* 6.5  ALBUMIN 3.5 3.4* 3.4* 3.3*    TUMOR MARKERS: No results for input(s): AFPTM, CEA, CA199, CHROMGRNA in the last 8760 hours.  Assessment and Plan:  History of Breast cancer with recurrence.  Will place Port A Cath today by Dr. Barbie Banner.  Risks and Benefits discussed with the patient including, but not limited to bleeding, infection, pneumothorax, or fibrin sheath development and need for additional procedures.  All of the patient's questions were answered, patient is agreeable to proceed. Consent signed and in chart.  Thank you for this interesting consult.  I greatly enjoyed meeting Patty Buckley and look forward to participating in their care.  A copy of this report was sent to the requesting provider on this date.  Electronically Signed: Murrell Redden PA-C 12/26/2015, 8:14 AM   I spent a total of  30 Minutes   in face to face in clinical consultation, greater than 50% of which was counseling/coordinating care for placement of Port A cath

## 2015-12-26 NOTE — Progress Notes (Signed)
Received pt from Recovery room s/p port insertion. Awake and alert and oriented x3. Denies pain @ port site.  Consent for Haloven obtained.

## 2015-12-26 NOTE — Discharge Instructions (Signed)

## 2015-12-27 ENCOUNTER — Telehealth: Payer: Self-pay | Admitting: *Deleted

## 2015-12-27 LAB — CULTURE, BLOOD (SINGLE)

## 2015-12-27 NOTE — Telephone Encounter (Signed)
1st Halaven yesterday. Patient eating and drinking well, denies any nausea or diarrhea. Advised to call clinic with any questions or concerns. She verbalized understanding.

## 2016-01-01 ENCOUNTER — Telehealth: Payer: Self-pay | Admitting: *Deleted

## 2016-01-01 NOTE — Telephone Encounter (Signed)
Lelon Frohlich (sister of patient) called to inform MD Gudena before 01/02/16 appointment about chills/night sweats patient is having every night without evidence of fever. Patient is drinking a lot of water without feeling like she has had enough. [facility] drug trial starts 01/17/16.   Message sent to MD Gudena.

## 2016-01-02 ENCOUNTER — Ambulatory Visit (HOSPITAL_BASED_OUTPATIENT_CLINIC_OR_DEPARTMENT_OTHER): Payer: Medicare Other

## 2016-01-02 ENCOUNTER — Other Ambulatory Visit (HOSPITAL_BASED_OUTPATIENT_CLINIC_OR_DEPARTMENT_OTHER): Payer: Medicare Other

## 2016-01-02 ENCOUNTER — Other Ambulatory Visit: Payer: Medicare Other

## 2016-01-02 ENCOUNTER — Encounter: Payer: Self-pay | Admitting: Hematology and Oncology

## 2016-01-02 ENCOUNTER — Ambulatory Visit (HOSPITAL_BASED_OUTPATIENT_CLINIC_OR_DEPARTMENT_OTHER): Payer: Medicare Other | Admitting: Hematology and Oncology

## 2016-01-02 ENCOUNTER — Telehealth: Payer: Self-pay | Admitting: Hematology and Oncology

## 2016-01-02 VITALS — BP 101/63 | HR 92 | Temp 98.5°F | Resp 18 | Ht 63.0 in | Wt 150.2 lb

## 2016-01-02 DIAGNOSIS — D701 Agranulocytosis secondary to cancer chemotherapy: Secondary | ICD-10-CM | POA: Diagnosis not present

## 2016-01-02 DIAGNOSIS — C50212 Malignant neoplasm of upper-inner quadrant of left female breast: Secondary | ICD-10-CM

## 2016-01-02 DIAGNOSIS — T451X5A Adverse effect of antineoplastic and immunosuppressive drugs, initial encounter: Secondary | ICD-10-CM

## 2016-01-02 DIAGNOSIS — C7989 Secondary malignant neoplasm of other specified sites: Secondary | ICD-10-CM

## 2016-01-02 DIAGNOSIS — Z95828 Presence of other vascular implants and grafts: Secondary | ICD-10-CM

## 2016-01-02 DIAGNOSIS — Z171 Estrogen receptor negative status [ER-]: Secondary | ICD-10-CM

## 2016-01-02 DIAGNOSIS — Z452 Encounter for adjustment and management of vascular access device: Secondary | ICD-10-CM | POA: Diagnosis present

## 2016-01-02 LAB — COMPREHENSIVE METABOLIC PANEL
ALBUMIN: 2.9 g/dL — AB (ref 3.5–5.0)
ALT: 10 U/L (ref 0–55)
AST: 20 U/L (ref 5–34)
Alkaline Phosphatase: 75 U/L (ref 40–150)
Anion Gap: 14 mEq/L — ABNORMAL HIGH (ref 3–11)
BUN: 5.5 mg/dL — ABNORMAL LOW (ref 7.0–26.0)
CALCIUM: 8.7 mg/dL (ref 8.4–10.4)
CHLORIDE: 98 meq/L (ref 98–109)
CO2: 23 mEq/L (ref 22–29)
Creatinine: 0.8 mg/dL (ref 0.6–1.1)
EGFR: 83 mL/min/{1.73_m2} — ABNORMAL LOW (ref >90)
Glucose: 142 mg/dl — ABNORMAL HIGH (ref 70–140)
Potassium: 3.3 mEq/L — ABNORMAL LOW (ref 3.5–5.1)
Sodium: 135 mEq/L — ABNORMAL LOW (ref 136–145)
TOTAL PROTEIN: 6.2 g/dL — AB (ref 6.4–8.3)
Total Bilirubin: 1.04 mg/dL (ref 0.20–1.20)

## 2016-01-02 LAB — CBC WITH DIFFERENTIAL/PLATELET
BASO%: 3.3 % — AB (ref 0.0–2.0)
Basophils Absolute: 0.1 10*3/uL (ref 0.0–0.1)
EOS%: 0.7 % (ref 0.0–7.0)
Eosinophils Absolute: 0 10*3/uL (ref 0.0–0.5)
HEMATOCRIT: 34.3 % — AB (ref 34.8–46.6)
HEMOGLOBIN: 11.8 g/dL (ref 11.6–15.9)
LYMPH#: 0.6 10*3/uL — AB (ref 0.9–3.3)
LYMPH%: 41.3 % (ref 14.0–49.7)
MCH: 36 pg — ABNORMAL HIGH (ref 25.1–34.0)
MCHC: 34.4 g/dL (ref 31.5–36.0)
MCV: 104.6 fL — ABNORMAL HIGH (ref 79.5–101.0)
MONO#: 0.2 10*3/uL (ref 0.1–0.9)
MONO%: 12.7 % (ref 0.0–14.0)
NEUT%: 42 % (ref 38.4–76.8)
NEUTROS ABS: 0.6 10*3/uL — AB (ref 1.5–6.5)
Platelets: 171 10*3/uL (ref 145–400)
RBC: 3.28 10*6/uL — ABNORMAL LOW (ref 3.70–5.45)
RDW: 15.7 % — AB (ref 11.2–14.5)
WBC: 1.5 10*3/uL — AB (ref 3.9–10.3)

## 2016-01-02 MED ORDER — FILGRASTIM 480 MCG/0.8ML IJ SOSY
480.0000 ug | PREFILLED_SYRINGE | Freq: Once | INTRAMUSCULAR | Status: AC
  Administered 2016-01-02: 480 ug via SUBCUTANEOUS
  Filled 2016-01-02: qty 0.8

## 2016-01-02 MED ORDER — SODIUM CHLORIDE 0.9 % IJ SOLN
10.0000 mL | INTRAMUSCULAR | Status: DC | PRN
  Administered 2016-01-02: 10 mL via INTRAVENOUS
  Filled 2016-01-02: qty 10

## 2016-01-02 NOTE — Telephone Encounter (Signed)
Appointments made and avs printed for patient °

## 2016-01-02 NOTE — Assessment & Plan Note (Signed)
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 Molli Posey on 08/21/2015 Adjuvant radiation therapy with concurrent Xeloda completed 10/23/2015 Xeloda (based on results from CREATE-X study) 2000 mg by mouth twice a day discontinued 12/14/2015 PET CT scan 80/99/8338:[id number] hypermetabolic disease to the right axilla (largest 4.7 cm laminated mass), lungs (6 mm Rt, 1.8 cm LLL nodules), liver (6X 7.1 cm; 6.4 x 4.8 cm), abdominal lymph nodes ( 1cm porta hepatis LN, left peri-aortic RP LN 5 mm), left lamina of T12, bilateral femoral head AVN -----------------------------------------------------------------------------------------------------------------------------------------------  Plan to treatment: Halaven days 1 and 8 q 3 weeks to start 12/26/15 Recurrent fevers: We obtained blood cultures and urine analysis, which were all normal. We treated her on antibiotic with Augmentin and this did not resolve her fevers. I believe her fevers are related to her malignancy.   Patient wanted to go on the Pembrolizumab clinical trial but because the trial is taking time to open, we decided to start chemotherapy.  Halaven toxicities:   Return to clinic in 2 weeks for cycle 2.

## 2016-01-02 NOTE — Progress Notes (Signed)
Patient Care Team: Antony Blackbird, MD as PCP - General (Family Medicine)  DIAGNOSIS: Breast cancer of upper-inner quadrant of left female breast [facility])   Staging form: Breast, AJCC 7th Edition     Clinical: Stage IIIB (T4b, N0, M0) - Signed by Rulon Eisenmenger, MD on 01/17/2015   SUMMARY OF ONCOLOGIC HISTORY:   Breast cancer of upper-inner quadrant of left female breast ([place] [facility])   12/27/2014 Initial Diagnosis Grade 3 invasive mammary cancer, one lymph node negative in left axilla, ER 0%, PR 0%, Ki-67 98%, HER-2 negative ratio 1.38   01/06/2015 Breast MRI Left breast large enhancing mass, evidence of necrosis, extending from posterior through the middle third of left breast, extending from chest wall to the dermis, close to pectoralis fascia extending 4.5 cm, several bilateral axillary lymph nodes   01/17/2015 - 05/30/2015 Neo-Adjuvant Chemotherapy Neoadjuvant chemotherapy with dose dense Adriamycin and Cytoxan followed by Taxol and carboplatin   01/24/2015 - 01/27/2015 Hospital Admission Neutropenic fever admission, no source identified   06/08/2015 Breast MRI Interval erosion of the known carcinoma within the superior left breast through the overlying skin. The majority of the mass is now exophytic, decrease in Left Axill LN   06/23/2015 Surgery Left simple mastectomy: Invasive ductal carcinoma grade 3; 6.5 cm, lymphovascular invasion, margins negative, 0/3 lymph nodes negative, T4b N0M0 stage IIIB pathologic staging, ER 0%, PR 0%, HER-2 negative, Ki-67 98%   08/21/2015 Relapse/Recurrence Chest wall recurrence excision positive for breast cancer HER-2 negative   09/05/2015 - 10/23/2015 Radiation Therapy Radiation with Xeloda 1000 mg by mouth twice a day   11/09/2015 -  Chemotherapy Xeloda 1500 bid increased to 2000 mg bid starting 11/30/15   11/24/2015 Mammogram Palpable right axillary lymph node: Mammogram and ultrasound revealed group of abnormal appearing lymph nodes largest measures 3.3 x 1.8 x 1.9 cm   12/12/2015 PET scan Extensive hypermetabolic disease to the right axilla, lungs, liver, abdominal lymph nodes, left lamina of T12, bilateral femoral head AVN   12/26/2015 -  Chemotherapy Palliative chemo with Halaven days 1 and 8 q 3 weeks    CHIEF COMPLIANT: follow-up on halaven  INTERVAL HISTORY: Patty Buckley is a 65 year old with above-mentioned history metastatic breast cancer triple negative disease currently on palliative chemotherapy with Halaven. She felt tired for 2-3 days after chemotherapy accompanied by intermittent fevers with night sweats. She did not have any nausea or vomiting. Her appetite and taste are fine. Today her [place] is only 600 and cannot receive chemotherapy.  REVIEW OF SYSTEMS:   Constitutional: Denies fevers, chills or abnormal weight loss Eyes: Denies blurriness of vision Ears, nose, mouth, throat, and face: Denies mucositis or sore throat Respiratory: Denies cough, dyspnea or wheezes Cardiovascular: Denies palpitation, chest discomfort Gastrointestinal:  Denies nausea, heartburn or change in bowel habits Skin: Denies abnormal skin rashes Lymphatics: Denies new lymphadenopathy or easy bruising Neurological:Denies numbness, tingling or new weaknesses Behavioral/Psych: Mood is stable, no new changes  Extremities: No lower extremity edema  All other systems were reviewed with the patient and are negative.  I have reviewed the past medical history, past surgical history, social history and family history with the patient and they are unchanged from previous note.  ALLERGIES:  is allergic to codeine.  MEDICATIONS:  Current Outpatient Prescriptions  Medication Sig Dispense Refill  . aspirin 81 MG tablet Take 81 mg by mouth daily.      Marland Kitchen HYDROcodone-acetaminophen (NORCO/VICODIN) 5-325 MG tablet Take 1-2 tablets by mouth every 4 (four) hours as needed  for moderate pain. 60 tablet 0  . lidocaine-prilocaine (EMLA) cream Apply to affected area once 30 g 3  .  metoprolol (LOPRESSOR) 50 MG tablet Take 1 tablet (50 mg total) by mouth 2 (two) times daily. 180 tablet 3  . Multiple Vitamin (MULTIVITAMIN) tablet Take 1 tablet by mouth daily.     . ondansetron (ZOFRAN) 8 MG tablet Take 1 tablet (8 mg total) by mouth 2 (two) times daily. Start the day after chemo for 2 days. Then take as needed for nausea or vomiting. 30 tablet 1  . prochlorperazine (COMPAZINE) 10 MG tablet Take 1 tablet (10 mg total) by mouth every 6 (six) hours as needed (Nausea or vomiting). 30 tablet 1   No current facility-administered medications for this visit.    PHYSICAL EXAMINATION: ECOG PERFORMANCE STATUS: 1 - Symptomatic but completely ambulatory  Filed Vitals:   01/02/16 1105  BP: 101/63  Pulse: 92  Temp: 98.5 F (36.9 C)  Resp: 18   Filed Weights   01/02/16 1105  Weight: 150 lb 3.2 oz (68.13 kg)    GENERAL:alert, no distress and comfortable SKIN: skin color, texture, turgor are normal, no rashes or significant lesions EYES: normal, Conjunctiva are pink and non-injected, sclera clear OROPHARYNX:no exudate, no erythema and lips, buccal mucosa, and tongue normal  NECK: supple, thyroid normal size, non-tender, without nodularity LYMPH:  no palpable lymphadenopathy in the cervical, axillary or inguinal LUNGS: clear to auscultation and percussion with normal breathing effort HEART: regular rate & rhythm and no murmurs and no lower extremity edema ABDOMEN:abdomen soft, non-tender and normal bowel sounds MUSCULOSKELETAL:no cyanosis of digits and no clubbing  NEURO: alert & oriented x 3 with fluent speech, no focal motor/sensory deficits EXTREMITIES: No lower extremity edema  LABORATORY DATA:  I have reviewed the data as listed   Chemistry      Component Value Date/Time   NA 135* 01/02/2016 1028   NA 137 06/24/2015 0550   K 3.3* 01/02/2016 1028   K 4.2 06/24/2015 0550   CL 106 06/24/2015 0550   CO2 23 01/02/2016 1028   CO2 24 06/24/2015 0550   BUN 5.5*  01/02/2016 1028   BUN 7 06/24/2015 0550   CREATININE 0.8 01/02/2016 1028   CREATININE 0.73 06/24/2015 0550      Component Value Date/Time   CALCIUM 8.7 01/02/2016 1028   CALCIUM 8.3* 06/24/2015 0550   ALKPHOS 75 01/02/2016 1028   ALKPHOS 58 06/24/2015 0550   AST 20 01/02/2016 1028   AST 16 06/24/2015 0550   ALT 10 01/02/2016 1028   ALT 18 06/24/2015 0550   BILITOT 1.04 01/02/2016 1028   BILITOT 0.2* 06/24/2015 0550       Lab Results  Component Value Date   WBC 1.5* 01/02/2016   HGB 11.8 01/02/2016   HCT 34.3* 01/02/2016   MCV 104.6* 01/02/2016   PLT 171 01/02/2016   NEUTROABS 0.6* 01/02/2016   ASSESSMENT & PLAN:  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 Margaree Mackintosh on 08/21/2015  Adjuvant radiation therapy with concurrent Xeloda completed 10/23/2015 Xeloda (based on results from CREATE-X study) 2000 mg by mouth twice a day discontinued 12/14/2015 PET CT scan 36/11/2448:[id number] hypermetabolic disease to the right axilla (largest 4.7 cm laminated mass), lungs (6 mm Rt, 1.8 cm LLL nodules), liver (6X 7.1 cm; 6.4 x 4.8 cm), abdominal lymph nodes ( 1cm porta hepatis LN, left peri-aortic RP LN 5 mm), left lamina of T12, bilateral femoral head AVN -----------------------------------------------------------------------------------------------------------------------------------------------  Plan to treatment: Halaven days 1 and 8 q 3 weeks to start  12/26/15 Recurrent fevers: We obtained blood cultures and urine analysis, which were all normal. We treated her on antibiotic with Augmentin and this did not resolve her fevers. I believe her fevers are related to her malignancy.   Patient wanted to go on the Pembrolizumab clinical trial but because the trial is taking time to open, we decided to start chemotherapy.  Halaven toxicities:  1. Neutropenia on cycle 1 day 8: We will reduce the dosage of Haldol and and will give her Neupogen injection today. Return to clinic in 1 week to retry giving her cycle 1 day 8. 2. Fevers 3. Fatigue  Return to clinic in 1 week for cycle 1 day 8.    No orders of the defined types were placed in this encounter.   The patient has a good understanding of the overall plan. she agrees with it. she will call with any problems that may develop before the next visit here.   Rulon Eisenmenger, MD 01/02/2016

## 2016-01-02 NOTE — Patient Instructions (Signed)

## 2016-01-02 NOTE — Progress Notes (Signed)
Per Dr. Lindi Adie, chemo to be held for counts.  Pt to receive neupogen 480 today.  Order entered.  Infusion room notified to hold, admin neupogen, and de-access port.

## 2016-01-02 NOTE — Progress Notes (Signed)
Pt reports no problems with chemo.  Had difficulty after stopping steroid.

## 2016-01-03 ENCOUNTER — Telehealth: Payer: Self-pay

## 2016-01-03 NOTE — Telephone Encounter (Signed)
Returned call to pt sister re: info.  Provided her the name of the chemo her sister is currently taking (halaven) and let her know Research Nurse will give her a call.  She voiced understanding.

## 2016-01-08 ENCOUNTER — Encounter: Payer: Self-pay | Admitting: Nurse Practitioner

## 2016-01-08 ENCOUNTER — Telehealth: Payer: Self-pay

## 2016-01-08 ENCOUNTER — Telehealth: Payer: Self-pay | Admitting: Hematology and Oncology

## 2016-01-08 ENCOUNTER — Ambulatory Visit (HOSPITAL_BASED_OUTPATIENT_CLINIC_OR_DEPARTMENT_OTHER): Payer: Medicare Other | Admitting: Nurse Practitioner

## 2016-01-08 ENCOUNTER — Ambulatory Visit (HOSPITAL_BASED_OUTPATIENT_CLINIC_OR_DEPARTMENT_OTHER): Payer: Medicare Other

## 2016-01-08 VITALS — BP 97/54 | HR 108 | Temp 98.7°F | Resp 20 | Ht 63.0 in | Wt 145.5 lb

## 2016-01-08 DIAGNOSIS — C50212 Malignant neoplasm of upper-inner quadrant of left female breast: Secondary | ICD-10-CM

## 2016-01-08 DIAGNOSIS — E876 Hypokalemia: Secondary | ICD-10-CM | POA: Diagnosis not present

## 2016-01-08 DIAGNOSIS — E860 Dehydration: Secondary | ICD-10-CM | POA: Diagnosis not present

## 2016-01-08 DIAGNOSIS — E46 Unspecified protein-calorie malnutrition: Secondary | ICD-10-CM | POA: Insufficient documentation

## 2016-01-08 DIAGNOSIS — R197 Diarrhea, unspecified: Secondary | ICD-10-CM | POA: Diagnosis not present

## 2016-01-08 DIAGNOSIS — G893 Neoplasm related pain (acute) (chronic): Secondary | ICD-10-CM | POA: Diagnosis not present

## 2016-01-08 DIAGNOSIS — R770 Abnormality of albumin: Secondary | ICD-10-CM

## 2016-01-08 LAB — COMPREHENSIVE METABOLIC PANEL
ALT: 14 U/L (ref 0–55)
ANION GAP: 19 meq/L — AB (ref 3–11)
AST: 22 U/L (ref 5–34)
Albumin: 2.8 g/dL — ABNORMAL LOW (ref 3.5–5.0)
Alkaline Phosphatase: 71 U/L (ref 40–150)
BILIRUBIN TOTAL: 1.72 mg/dL — AB (ref 0.20–1.20)
BUN: 12.1 mg/dL (ref 7.0–26.0)
CALCIUM: 8.4 mg/dL (ref 8.4–10.4)
CHLORIDE: 98 meq/L (ref 98–109)
CO2: 16 meq/L — AB (ref 22–29)
CREATININE: 0.9 mg/dL (ref 0.6–1.1)
EGFR: 65 mL/min/{1.73_m2} — AB (ref >90)
Glucose: 175 mg/dl — ABNORMAL HIGH (ref 70–140)
Potassium: 3.3 mEq/L — ABNORMAL LOW (ref 3.5–5.1)
Sodium: 132 mEq/L — ABNORMAL LOW (ref 136–145)
Total Protein: 6.6 g/dL (ref 6.4–8.3)

## 2016-01-08 LAB — CBC WITH DIFFERENTIAL/PLATELET
BASO%: 0 % (ref 0.0–2.0)
Basophils Absolute: 0 10*3/uL (ref 0.0–0.1)
EOS%: 0 % (ref 0.0–7.0)
Eosinophils Absolute: 0 10*3/uL (ref 0.0–0.5)
HEMATOCRIT: 39.8 % (ref 34.8–46.6)
HGB: 13 g/dL (ref 11.6–15.9)
LYMPH%: 4.3 % — AB (ref 14.0–49.7)
MCH: 34.7 pg — AB (ref 25.1–34.0)
MCHC: 32.7 g/dL (ref 31.5–36.0)
MCV: 106 fL — AB (ref 79.5–101.0)
MONO#: 0.8 10*3/uL (ref 0.1–0.9)
MONO%: 6.8 % (ref 0.0–14.0)
NEUT%: 88.9 % — ABNORMAL HIGH (ref 38.4–76.8)
NEUTROS ABS: 10.9 10*3/uL — AB (ref 1.5–6.5)
PLATELETS: 202 10*3/uL (ref 145–400)
RBC: 3.75 10*6/uL (ref 3.70–5.45)
RDW: 18 % — ABNORMAL HIGH (ref 11.2–14.5)
WBC: 12.3 10*3/uL — AB (ref 3.9–10.3)
lymph#: 0.5 10*3/uL — ABNORMAL LOW (ref 0.9–3.3)

## 2016-01-08 MED ORDER — LOPERAMIDE HCL 2 MG PO TABS
4.0000 mg | ORAL_TABLET | Freq: Once | ORAL | Status: AC
  Administered 2016-01-08: 4 mg via ORAL
  Filled 2016-01-08: qty 2

## 2016-01-08 MED ORDER — LOPERAMIDE HCL 2 MG PO CAPS
ORAL_CAPSULE | ORAL | Status: AC
  Filled 2016-01-08: qty 2

## 2016-01-08 MED ORDER — SODIUM CHLORIDE 0.9 % IV SOLN
INTRAVENOUS | Status: DC
  Administered 2016-01-08: 14:00:00 via INTRAVENOUS

## 2016-01-08 MED ORDER — POTASSIUM CHLORIDE CRYS ER 20 MEQ PO TBCR: 20.0000 meq | EXTENDED_RELEASE_TABLET | Freq: Every day | ORAL | Status: AC

## 2016-01-08 MED ORDER — TRAMADOL HCL 50 MG PO TABS: 50.0000 mg | ORAL_TABLET | Freq: Four times a day (QID) | ORAL | Status: DC | PRN

## 2016-01-08 MED ORDER — SODIUM CHLORIDE 0.9% FLUSH
10.0000 mL | INTRAVENOUS | Status: DC | PRN
  Filled 2016-01-08: qty 10

## 2016-01-08 MED ORDER — HEPARIN SOD (PORK) LOCK FLUSH 100 UNIT/ML IV SOLN
500.0000 [IU] | Freq: Once | INTRAVENOUS | Status: AC
  Administered 2016-01-08: 500 [IU] via INTRAVENOUS
  Filled 2016-01-08: qty 5

## 2016-01-08 MED FILL — POTASSIUM CL ER 20 MEQ TAB: 20 | 10 days supply | Qty: 10 | Fill #0

## 2016-01-08 MED FILL — traMADol HCL 50 MG TABS: 50 | 5 days supply | Qty: 20 | Fill #0

## 2016-01-08 NOTE — Assessment & Plan Note (Signed)
Albumen is decreased from 3.3 down to 2.8 today.  Patient was encouraged to push protein in her diet is much as possible.

## 2016-01-08 NOTE — Assessment & Plan Note (Signed)
Patient states that she's been having chronic diarrhea up to 60 times per day for the past 2-3 days.  She has not taken any Imodium or other over-the-counter anti--diarrhea medication whatsoever.  She states that her diarrhea stool has a different odor to it as well.  Patient states that she did take Augmentin several weeks ago when she was having some possible fevers.  We will order a stool sample to rule out C. difficile.  Also, patient was advised to try Imodium 2 tablets with each loose stool.  She was given 2 Imodium as while at the [place] today as well.  If stool sample comes back negative for C. difficile; may need to consider prescribing Lomotil to alternate with the Imodium.

## 2016-01-08 NOTE — Telephone Encounter (Signed)
Msg rcvd from pt sister Darcus Austin - pt having chills at night and pain in right arm.  Asking if there is anything that can be done for the pain other than pain meds.  Pt does not like to take during day d/t side effects. Returned call to sister - she could not answer any questions. Spoke directly with patient - patient reports "doesnt feel well", unable to maintain the conversation, reports she cannot get from couch to bathroom, weak, believes she may be dehydrated, reports diarrhea, reports liquid intake of approximately 48 oz/day.  Pt has not checked her temp, reports no swell in right arm, no change in temp or color.  Pain from axilla to elbow, intermittent, aching/shooting, 7/10.  Pt believes tumor has gotten bigger.    Advised patient she needs to come in.  She will call sister for transportation and have sister call us with arrival time.    Chart review - pt has hx of running fevers.  No cultures ordered at this time.  CBC and CMET ordered.    Called Ms. Thornlow - she can have pt here at 1130.  POF placed for appts.  Spoke with Lynnda Shields - she can see patient.  Phone note routed to [place].

## 2016-01-08 NOTE — Progress Notes (Signed)
SYMPTOM MANAGEMENT CLINIC   HPI: Patty Buckley 65 y.o. female diagnosed with breast cancer; with metastasis to the lungs, liver, and abdomen.  Also, questionable bone metastases to the spine as well. Currently undergoing Eribulin chemotherapy.  Patient received cycle 1, day 1 of her Eribulin palliative chemotherapy on 12/26/2015.  Cycle 1, day 8 of the same chemotherapy was held last week due to neutropenia.  Patient received Neupogen 480 g on 01/02/2016 for growth factor support.  The plan is to also decrease the Eribulin dose as well to prevent further neutropenia issues.  Patient continues to complain of chronic right breast and right axilla pain.  She feels that the tumor to the right axilla region has significantly increased within the past few weeks as well.  She also complains of occasional right upper quadrant pain.  Patient was informed that she had new bilateral lung nodules, liver metastasis, and some upper abdominal nodules per her restaging PET scan obtained on 12/12/2015.  Exam today revealed a firm mass to the right outer breast and right axilla region that is somewhat tender with palpation.  Patient is also developed some diarrhea within the past few days; and feels significantly dehydrated today.  She is complaining of increased weakness and has difficulty ambulating around her home due to the weakness.  Patient denies any recent fever; but does continue with some nightly chills.  She did undergo a fever workup a few weeks ago; with negative urine and blood cultures.  She completed a round of all met in prophylactically as well.  Will obtain a stool to rule out C. difficile today.  Patient will also receive IV fluid rehydration today.  Reviewed all findings with Dr. Lendon Colonel the decision was made to cancel all appointments and chemotherapy that was scheduled for tomorrow, 01/09/2016.  Will need to reschedule labs, flush, follow-up visit with Dr. Lindi Adie, and chemotherapy  on 01/15/2016.      HPI  Review of Systems  Constitutional: Positive for chills and malaise/fatigue. Negative for fever.  Respiratory: Positive for cough.   Gastrointestinal: Positive for abdominal pain and diarrhea. Negative for nausea and vomiting.  Neurological: Positive for weakness.  All other systems reviewed and are negative.   Past Medical History  Diagnosis Date  . Hyperlipidemia   . Left ventricular outflow obstruction   . Cigarette smoker   . Vitamin D deficiency   . Osteopenia   . Hemorrhoids   . Ventricular hypertrophy   . Wears glasses   . Heart murmur   . Hypertension     metoprolol  . Inflammation of finger or toe     pt. states fingers  . Hernia     umbilical  . Pneumonia 0737'T X ?2  . Chronic bronchitis ([place])     "almost q yr;  usually in the fall" (06/23/2015)  . Cancer of left breast ([place])     mastectomy 06/23/2015  . Arthritis     "joint pain in my fingers" (06/23/2015)    Past Surgical History  Procedure Laterality Date  . Tonsillectomy    . Tubal ligation    . US echocardiography  04/24/2007    EF 55-60%  . Cardiovascular stress test  05/07/2007    EF 82% NO ISCHEMIA  . Wisdom tooth extraction    . Portacath placement Right 01/11/2015    Procedure: INSERTION PORT-A-CATH;  Surgeon: Donnie Mesa, MD;  Location: [facility];  Service: General;  Laterality: Right;     Attempted  failed to implant,   . Colonoscopy    . Mastectomy Left 06/23/2015  . Port-a-cath removal  06/23/2015  . Breast biopsy Left ~ 01/2015  . Peripheral vascular catheterization Right 06/23/2015    Procedure: PORTA CATH REMOVAL;  Surgeon: Donnie Mesa, MD;  Location: [facility];  Service: General;  Laterality: Right;  . Total mastectomy Left 06/23/2015    Procedure: LEFT MASTECTOMY;  Surgeon: Donnie Mesa, MD;  Location: [facility];  Service: General;  Laterality: Left;    has Hypertrophic obstructive cardiomyopathy (HOCM) ([place]); Hyperlipidemia; Breast cancer of  upper-inner quadrant of left female breast ([place]); Neutropenic fever ([place]); Febrile neutropenia ([place]); Chemotherapy induced neutropenia ([place]); Dehydration; Hypokalemia; Hyperbilirubinemia; Hypoalbuminemia due to protein-calorie malnutrition ([place]); Diarrhea; and Cancer associated pain on her problem list.    is allergic to codeine.    Medication List       This list is accurate as of: 01/08/16  2:59 PM.  Always use your most recent med list.               aspirin 81 MG tablet  Take 81 mg by mouth daily.     HYDROcodone-acetaminophen 5-325 MG tablet  Commonly known as:  NORCO/VICODIN  Take 1-2 tablets by mouth every 4 (four) hours as needed for moderate pain.     lidocaine-prilocaine cream  Commonly known as:  EMLA  Apply to affected area once     metoprolol 50 MG tablet  Commonly known as:  LOPRESSOR  Take 1 tablet (50 mg total) by mouth 2 (two) times daily.     multivitamin tablet  Take 1 tablet by mouth daily.     ondansetron 8 MG tablet  Commonly known as:  ZOFRAN  Take 1 tablet (8 mg total) by mouth 2 (two) times daily. Start the day after chemo for 2 days. Then take as needed for nausea or vomiting.     potassium chloride SA 20 MEQ tablet  Commonly known as:  K-DUR,KLOR-CON  Take 1 tablet (20 mEq total) by mouth daily.     prochlorperazine 10 MG tablet  Commonly known as:  COMPAZINE  Take 1 tablet (10 mg total) by mouth every 6 (six) hours as needed (Nausea or vomiting).     traMADol 50 MG tablet  Commonly known as:  ULTRAM  Take 1 tablet (50 mg total) by mouth every 6 (six) hours as needed.         PHYSICAL EXAMINATION  Oncology Vitals 01/08/2016 01/02/2016  Height 160 cm 160 cm  Weight 65.998 kg 68.13 kg  Weight (lbs) 145 lbs 8 oz 150 lbs 3 oz  BMI (kg/m2) 25.77 kg/m2 26.61 kg/m2  Temp 98.4 98.5  Pulse 143 92  Resp 22 18  SpO2 99 98  BSA (m2) 1.71 m2 1.74 m2   BP Readings from Last 2 Encounters:  01/08/16 116/75  01/02/16 101/63    Physical Exam    Constitutional: She is oriented to person, place, and time. She appears malnourished and dehydrated. She appears unhealthy. She appears cachectic.  Patient appears fatigued, weak, and chronically ill.  HENT:  Head: Normocephalic.  Eyes: Conjunctivae and EOM are normal. Pupils are equal, round, and reactive to light. Right eye exhibits no discharge. Left eye exhibits no discharge. No scleral icterus.  Neck: Normal range of motion. Neck supple. No JVD present. No tracheal deviation present. No thyromegaly present.  Cardiovascular: Normal heart sounds and intact distal pulses.   Initial check for heart rate was 143.  Will recheck  heart rate after IV fluid rehydration.  Most likely, secondary to dehydration.  Pulmonary/Chest: Effort normal and breath sounds normal. No respiratory distress. She has no wheezes. She has no rales. She exhibits tenderness.  Patient has a mass that is firm and mildly tender with palpation to the right side of her outer breast and right axilla region.  Left mastectomy scar, healed with post radiation pigmentation.  Abdominal: Soft. Bowel sounds are normal. She exhibits no distension and no mass. There is tenderness. There is no rebound and no guarding.  No specific abdominal discomfort with palpation.  Musculoskeletal: Normal range of motion. She exhibits no edema or tenderness.  Lymphadenopathy:    She has no cervical adenopathy.  Neurological: She is alert and oriented to person, place, and time.  Skin: Skin is warm and dry. No rash noted. No erythema. No pallor.  Psychiatric: Affect normal.  Nursing note and vitals reviewed.   LABORATORY DATA:. Appointment on 01/08/2016  Component Date Value Ref Range Status  . WBC 01/08/2016 12.3* 3.9 - 10.3 10e3/uL Final  . NEUT# 01/08/2016 10.9* 1.5 - 6.5 10e3/uL Final  . HGB 01/08/2016 13.0  11.6 - 15.9 g/dL Final  . HCT 01/08/2016 39.8  34.8 - 46.6 % Final  . Platelets 01/08/2016 202  145 - 400 10e3/uL Final  . MCV  01/08/2016 106.0* 79.5 - 101.0 fL Final  . MCH 01/08/2016 34.7* 25.1 - 34.0 pg Final  . MCHC 01/08/2016 32.7  31.5 - 36.0 g/dL Final  . RBC 01/08/2016 3.75  3.70 - 5.45 10e6/uL Final  . RDW 01/08/2016 18.0* 11.2 - 14.5 % Final  . lymph# 01/08/2016 0.5* 0.9 - 3.3 10e3/uL Final  . MONO# 01/08/2016 0.8  0.1 - 0.9 10e3/uL Final  . Eosinophils Absolute 01/08/2016 0.0  0.0 - 0.5 10e3/uL Final  . Basophils Absolute 01/08/2016 0.0  0.0 - 0.1 10e3/uL Final  . NEUT% 01/08/2016 88.9* 38.4 - 76.8 % Final  . LYMPH% 01/08/2016 4.3* 14.0 - 49.7 % Final  . MONO% 01/08/2016 6.8  0.0 - 14.0 % Final  . EOS% 01/08/2016 0.0  0.0 - 7.0 % Final  . BASO% 01/08/2016 0.0  0.0 - 2.0 % Final  . Sodium 01/08/2016 132* 136 - 145 mEq/L Final  . Potassium 01/08/2016 3.3* 3.5 - 5.1 mEq/L Final  . Chloride 01/08/2016 98  98 - 109 mEq/L Final  . CO2 01/08/2016 16* 22 - 29 mEq/L Final  . Glucose 01/08/2016 175* 70 - 140 mg/dl Final   Glucose reference range is for nonfasting patients. Fasting glucose reference range is 70- 100.  Marland Kitchen BUN 01/08/2016 12.1  7.0 - 26.0 mg/dL Final  . Creatinine 01/08/2016 0.9  0.6 - 1.1 mg/dL Final  . Total Bilirubin 01/08/2016 1.72* 0.20 - 1.20 mg/dL Final  . Alkaline Phosphatase 01/08/2016 71  40 - 150 U/L Final  . AST 01/08/2016 22  5 - 34 U/L Final  . ALT 01/08/2016 14  0 - 55 U/L Final  . Total Protein 01/08/2016 6.6  6.4 - 8.3 g/dL Final  . Albumin 01/08/2016 2.8* 3.5 - 5.0 g/dL Final  . Calcium 01/08/2016 8.4  8.4 - 10.4 mg/dL Final  . Anion Gap 01/08/2016 19* 3 - 11 mEq/L Final  . EGFR 01/08/2016 65* >90 ml/min/1.73 m2 Final   eGFR is calculated using the CKD-EPI Creatinine Equation (2009)     RADIOGRAPHIC STUDIES: No results found.  ASSESSMENT/PLAN:    Hypokalemia Potassium was down to 3.3 today.  Most likely, this is secondary  to chronic diarrhea.  Patient was prescribed potassium 20 mg to take on a daily basis for approximately 7-10 days.  We'll continue to monitor with  labs.  Hypoalbuminemia due to protein-calorie malnutrition ([facility]) Albumen is decreased from 3.3 down to 2.8 today.  Patient was encouraged to push protein in her diet is much as possible.  Hyperbilirubinemia Bilirubin has increased from 1.4, 2 up to 1.72.  However, patient's liver enzymes remained within normal range.  Patient does complain of some right upper quadrant discomfort at times.  Reviewed these findings with Dr. Lindi Adie; and will order a direct bilirubin lab with next lab draw next week.  Diarrhea Patient states that she's been having chronic diarrhea up to 60 times per day for the past 2-3 days.  She has not taken any Imodium or other over-the-counter anti--diarrhea medication whatsoever.  She states that her diarrhea stool has a different odor to it as well.  Patient states that she did take Augmentin several weeks ago when she was having some possible fevers.  We will order a stool sample to rule out C. difficile.  Also, patient was advised to try Imodium 2 tablets with each loose stool.  She was given 2 Imodium as while at the [facility] today as well.  If stool sample comes back negative for C. difficile; may need to consider prescribing Lomotil to alternate with the Imodium.  Dehydration Patient states that she's been having chronic diarrhea up to 60 times per day for the past 2-3 days.  She has not taken any Imodium or other over-the-counter anti--diarrhea medication whatsoever.  She states that her diarrhea stool has a different odor to it as well.  Patient states that she did take Augmentin several weeks ago when she was having some possible fevers.  We will order a stool sample to rule out C. difficile.  Also, patient was advised to try Imodium 2 tablets with each loose stool.  She was given 2 Imodium as while at the [facility] today as well.  If stool sample comes back negative for C. difficile; may need to consider prescribing Lomotil to alternate with the  Imodium.  Sodium today was down to 132.  Patient does appear dehydrated.  Patient will receive approximately 1500 mLs normal saline IV fluid rehydration while at the cancer Center today.  She was encouraged to push fluids at home as well.   Cancer associated pain Patient takes 1 Vicodin 5/325 mg tablet at night for her chronic pain to the right breast and right axilla region.  She states that she stays in pain all day long; but avoids taking narcotic pain medication since it makes her too sleepy.  After long discussion regarding pain management-patient was in agreement to try Ultram during the day.  Of note-patient states that she has codeine listed as an allergy; but actually has no known issues with codeine in the past.  Advised patient to try the Ultram sparingly at first; to confirm that she has no allergic reaction.  Advised patient she could try Benadryl/Pepcid if she has any allergic reaction; and to also go directly to the emergency department for any severe allergic reactions whatsoever.  Breast cancer of upper-inner quadrant of left female breast [facility]) Patient received cycle 1, day 1 of her Eribulin palliative chemotherapy on 12/26/2015.  Cycle 1, day 8 of the same chemotherapy was held last week due to neutropenia.  Patient received Neupogen 480 g on 01/02/2016 for growth factor support.  The plan is  to also decrease the Eribulin dose as well to prevent further neutropenia issues.  Patient continues to complain of chronic right breast and right axilla pain.  She feels that the tumor to the right axilla region has significantly increased within the past few weeks as well.  She also complains of occasional right upper quadrant pain.  Patient was informed that she had new bilateral lung nodules, liver metastasis, and some upper abdominal nodules per her restaging PET scan obtained on 12/12/2015.  Exam today revealed a firm mass to the right outer breast and right axilla region that is  somewhat tender with palpation.  Patient is also developed some diarrhea within the past few days; and feels significantly dehydrated today.  She is complaining of increased weakness and has difficulty ambulating around her home due to the weakness.  Patient denies any recent fever; but does continue with some nightly chills.  She did undergo a fever workup a few weeks ago; with negative urine and blood cultures.  She completed a round of all met in prophylactically as well.  Will obtain a stool to rule out C. difficile today.  Patient will also receive IV fluid rehydration today.  Reviewed all findings with Dr. Lendon Colonel the decision was made to cancel all appointments and chemotherapy that was scheduled for tomorrow, 01/09/2016.  Will need to reschedule labs, flush, follow-up visit with Dr. Lindi Adie, and chemotherapy on 01/15/2016.  Patient stated understanding of all instructions; and was in agreement with this plan of care. The patient knows to call the clinic with any problems, questions or concerns.   Review/collaboration with Dr. Lindi Adie regarding all aspects of patient's visit today.   Total time spent with patient was 40 minutes;  with greater than 75 percent of that time spent in face to face counseling regarding patient's symptoms,  and coordination of care and follow up.  Disclaimer:This dictation was prepared with Dragon/digital dictation along with [employer]. Any transcriptional errors that result from this process are unintentional.  Drue Second, NP 01/08/2016

## 2016-01-08 NOTE — Patient Instructions (Signed)

## 2016-01-08 NOTE — Assessment & Plan Note (Addendum)
Patient received cycle 1, day 1 of her Eribulin palliative chemotherapy on 12/26/2015.  Cycle 1, day 8 of the same chemotherapy was held last week due to neutropenia.  Patient received Neupogen 480 g on 01/02/2016 for growth factor support.  The plan is to also decrease the Eribulin dose as well to prevent further neutropenia issues.  Patient continues to complain of chronic right breast and right axilla pain.  She feels that the tumor to the right axilla region has significantly increased within the past few weeks as well.  She also complains of occasional right upper quadrant pain.  Patient was informed that she had new bilateral lung nodules, liver metastasis, and some upper abdominal nodules per her restaging PET scan obtained on 12/12/2015.  Exam today revealed a firm mass to the right outer breast and right axilla region that is somewhat tender with palpation.  Patient is also developed some diarrhea within the past few days; and feels significantly dehydrated today.  She is complaining of increased weakness and has difficulty ambulating around her home due to the weakness.  Patient denies any recent fever; but does continue with some nightly chills.  She did undergo a fever workup a few weeks ago; with negative urine and blood cultures.  She completed a round of all met in prophylactically as well.  Will obtain a stool to rule out C. difficile today.  Patient will also receive IV fluid rehydration today.  Reviewed all findings with Dr. Lendon Colonel the decision was made to cancel all appointments and chemotherapy that was scheduled for tomorrow, 01/09/2016.  Will need to reschedule labs, flush, follow-up visit with Dr. Lindi Adie, and chemotherapy on 01/15/2016.

## 2016-01-08 NOTE — Telephone Encounter (Signed)
Left message for patient re next appointment for 2/6 and mailed schedule. Appointments for 2/7 cxd due per pof see VG 2/6 w/tx.

## 2016-01-08 NOTE — Assessment & Plan Note (Signed)
Potassium was down to 3.3 today.  Most likely, this is secondary to chronic diarrhea.  Patient was prescribed potassium 20 mg to take on a daily basis for approximately 7-10 days.  We'll continue to monitor with labs.

## 2016-01-08 NOTE — Assessment & Plan Note (Signed)
Patient takes 1 Vicodin 5/325 mg tablet at night for her chronic pain to the right breast and right axilla region.  She states that she stays in pain all day long; but avoids taking narcotic pain medication since it makes her too sleepy.  After long discussion regarding pain management-patient was in agreement to try Ultram during the day.  Of note-patient states that she has codeine listed as an allergy; but actually has no known issues with codeine in the past.  Advised patient to try the Ultram sparingly at first; to confirm that she has no allergic reaction.  Advised patient she could try Benadryl/Pepcid if she has any allergic reaction; and to also go directly to the emergency department for any severe allergic reactions whatsoever.

## 2016-01-08 NOTE — Assessment & Plan Note (Signed)
Bilirubin has increased from 1.4, 2 up to 1.72.  However, patient's liver enzymes remained within normal range.  Patient does complain of some right upper quadrant discomfort at times.  Reviewed these findings with Dr. Lindi Adie; and will order a direct bilirubin lab with next lab draw next week.

## 2016-01-08 NOTE — Assessment & Plan Note (Signed)
Patient states that she's been having chronic diarrhea up to 60 times per day for the past 2-3 days.  She has not taken any Imodium or other over-the-counter anti--diarrhea medication whatsoever.  She states that her diarrhea stool has a different odor to it as well.  Patient states that she did take Augmentin several weeks ago when she was having some possible fevers.  We will order a stool sample to rule out C. difficile.  Also, patient was advised to try Imodium 2 tablets with each loose stool.  She was given 2 Imodium as while at the [facility] today as well.  If stool sample comes back negative for C. difficile; may need to consider prescribing Lomotil to alternate with the Imodium.  Sodium today was down to 132.  Patient does appear dehydrated.  Patient will receive approximately 1500 mLs normal saline IV fluid rehydration while at the cancer Center today.  She was encouraged to push fluids at home as well.

## 2016-01-09 ENCOUNTER — Ambulatory Visit: Payer: Medicare Other

## 2016-01-09 ENCOUNTER — Telehealth: Payer: Self-pay | Admitting: *Deleted

## 2016-01-09 ENCOUNTER — Ambulatory Visit: Payer: Medicare Other | Admitting: Hematology and Oncology

## 2016-01-09 ENCOUNTER — Other Ambulatory Visit: Payer: Medicare Other

## 2016-01-09 NOTE — Telephone Encounter (Signed)
LM for rtn call- Need to check status following [facility] appt 1/30. Pt needs to bring a new stool sample for C-Diff lab test. The sample given in office was not enough to send for testing.

## 2016-01-11 ENCOUNTER — Other Ambulatory Visit: Payer: Self-pay | Admitting: Nurse Practitioner

## 2016-01-11 ENCOUNTER — Other Ambulatory Visit (HOSPITAL_BASED_OUTPATIENT_CLINIC_OR_DEPARTMENT_OTHER): Payer: Medicare Other

## 2016-01-11 ENCOUNTER — Telehealth: Payer: Self-pay | Admitting: *Deleted

## 2016-01-11 ENCOUNTER — Other Ambulatory Visit: Payer: Self-pay

## 2016-01-11 ENCOUNTER — Ambulatory Visit (HOSPITAL_BASED_OUTPATIENT_CLINIC_OR_DEPARTMENT_OTHER): Payer: Medicare Other | Admitting: Nurse Practitioner

## 2016-01-11 ENCOUNTER — Telehealth: Payer: Self-pay | Admitting: Nurse Practitioner

## 2016-01-11 ENCOUNTER — Encounter: Payer: Self-pay | Admitting: Nurse Practitioner

## 2016-01-11 VITALS — BP 110/64 | HR 89 | Temp 98.0°F | Resp 18 | Ht 63.0 in | Wt 146.2 lb

## 2016-01-11 DIAGNOSIS — R770 Abnormality of albumin: Secondary | ICD-10-CM | POA: Diagnosis not present

## 2016-01-11 DIAGNOSIS — E876 Hypokalemia: Secondary | ICD-10-CM | POA: Diagnosis not present

## 2016-01-11 DIAGNOSIS — E860 Dehydration: Secondary | ICD-10-CM

## 2016-01-11 DIAGNOSIS — E46 Unspecified protein-calorie malnutrition: Secondary | ICD-10-CM

## 2016-01-11 DIAGNOSIS — G893 Neoplasm related pain (acute) (chronic): Secondary | ICD-10-CM

## 2016-01-11 DIAGNOSIS — R197 Diarrhea, unspecified: Secondary | ICD-10-CM

## 2016-01-11 DIAGNOSIS — Z452 Encounter for adjustment and management of vascular access device: Secondary | ICD-10-CM | POA: Diagnosis not present

## 2016-01-11 DIAGNOSIS — Z95828 Presence of other vascular implants and grafts: Secondary | ICD-10-CM

## 2016-01-11 DIAGNOSIS — C50212 Malignant neoplasm of upper-inner quadrant of left female breast: Secondary | ICD-10-CM

## 2016-01-11 LAB — CBC WITH DIFFERENTIAL/PLATELET
BASO%: 0 % (ref 0.0–2.0)
Basophils Absolute: 0 10*3/uL (ref 0.0–0.1)
EOS%: 0.1 % (ref 0.0–7.0)
Eosinophils Absolute: 0 10*3/uL (ref 0.0–0.5)
HCT: 33.2 % — ABNORMAL LOW (ref 34.8–46.6)
HGB: 11 g/dL — ABNORMAL LOW (ref 11.6–15.9)
LYMPH%: 4.6 % — AB (ref 14.0–49.7)
MCH: 34.9 pg — ABNORMAL HIGH (ref 25.1–34.0)
MCHC: 33.1 g/dL (ref 31.5–36.0)
MCV: 105.6 fL — AB (ref 79.5–101.0)
MONO#: 0.4 10*3/uL (ref 0.1–0.9)
MONO%: 4 % (ref 0.0–14.0)
NEUT#: 8.9 10*3/uL — ABNORMAL HIGH (ref 1.5–6.5)
NEUT%: 91.3 % — AB (ref 38.4–76.8)
PLATELETS: 222 10*3/uL (ref 145–400)
RBC: 3.14 10*6/uL — AB (ref 3.70–5.45)
RDW: 18 % — ABNORMAL HIGH (ref 11.2–14.5)
WBC: 9.7 10*3/uL (ref 3.9–10.3)
lymph#: 0.4 10*3/uL — ABNORMAL LOW (ref 0.9–3.3)

## 2016-01-11 LAB — COMPREHENSIVE METABOLIC PANEL
ALT: 17 U/L (ref 0–55)
ANION GAP: 14 meq/L — AB (ref 3–11)
AST: 42 U/L — ABNORMAL HIGH (ref 5–34)
Albumin: 2.3 g/dL — ABNORMAL LOW (ref 3.5–5.0)
Alkaline Phosphatase: 83 U/L (ref 40–150)
BUN: 7.5 mg/dL (ref 7.0–26.0)
CHLORIDE: 98 meq/L (ref 98–109)
CO2: 20 meq/L — AB (ref 22–29)
Calcium: 8.6 mg/dL (ref 8.4–10.4)
Creatinine: 0.6 mg/dL (ref 0.6–1.1)
GLUCOSE: 97 mg/dL (ref 70–140)
Potassium: 4.1 mEq/L (ref 3.5–5.1)
SODIUM: 132 meq/L — AB (ref 136–145)
Total Bilirubin: 1.26 mg/dL — ABNORMAL HIGH (ref 0.20–1.20)
Total Protein: 6.1 g/dL — ABNORMAL LOW (ref 6.4–8.3)

## 2016-01-11 MED ORDER — HEPARIN SOD (PORK) LOCK FLUSH 100 UNIT/ML IV SOLN
500.0000 [IU] | Freq: Once | INTRAVENOUS | Status: AC
  Administered 2016-01-11: 500 [IU] via INTRAVENOUS
  Filled 2016-01-11: qty 5

## 2016-01-11 MED ORDER — SODIUM CHLORIDE 0.9% FLUSH
10.0000 mL | INTRAVENOUS | Status: DC | PRN
  Administered 2016-01-11: 10 mL via INTRAVENOUS
  Filled 2016-01-11: qty 10

## 2016-01-11 MED ORDER — SODIUM CHLORIDE 0.9 % IV SOLN
INTRAVENOUS | Status: DC
  Administered 2016-01-11: 12:00:00 via INTRAVENOUS

## 2016-01-11 NOTE — Progress Notes (Signed)
SYMPTOM MANAGEMENT CLINIC   HPI: Patty Buckley 65 y.o. female diagnosed with breast cancer.  Patient is status post left mastectomy.  Patient is currently undergoing Eribulin chemotherapy regimen.   Patient received cycle 1, day 1 of her Eribulin palliative chemotherapy on 12/26/2015.  Cycle 1, day 8 of the same chemotherapy was held due to neutropenia.  Patient received Neupogen 480 g on 01/02/2016 for growth factor support.  The plan is to also decrease the Eribulin dose as well to prevent further neutropenia issues.  Patient continues to complain of chronic right breast and right axilla pain.  She feels that the tumor to the right axilla region has significantly increased within the past few weeks as well.  She also complains of occasional right upper quadrant pain.  Patient was informed that she had new bilateral lung nodules, liver metastasis, and some upper abdominal nodules per her restaging PET scan obtained on 12/12/2015.  Patient states that her chronic nausea/vomiting and diarrhea have since resolved.  She continues to have minimal appetite and feels dehydrated today.  She denies any recent fevers or chills.  HPI  Review of Systems  Constitutional: Positive for malaise/fatigue.  All other systems reviewed and are negative.   Past Medical History  Diagnosis Date  . Hyperlipidemia   . Left ventricular outflow obstruction   . Cigarette smoker   . Vitamin D deficiency   . Osteopenia   . Hemorrhoids   . Ventricular hypertrophy   . Wears glasses   . Heart murmur   . Hypertension     metoprolol  . Inflammation of finger or toe     pt. states fingers  . Hernia     umbilical  . Pneumonia 6629'[id number] X ?2  . Chronic bronchitis ([place])     "almost q yr;  usually in the fall" (06/23/2015)  . Cancer of left breast ([place])     mastectomy 06/23/2015  . Arthritis     "joint pain in my fingers" (06/23/2015)    Past Surgical History  Procedure Laterality Date  . Tonsillectomy      . Tubal ligation    . US echocardiography  04/24/2007    EF 55-60%  . Cardiovascular stress test  05/07/2007    EF 82% NO ISCHEMIA  . Wisdom tooth extraction    . Portacath placement Right 01/11/2015    Procedure: INSERTION PORT-A-CATH;  Surgeon: Donnie Mesa, MD;  Location: [facility];  Service: General;  Laterality: Right;     Attempted  failed to implant,   . Colonoscopy    . Mastectomy Left 06/23/2015  . Port-a-cath removal  06/23/2015  . Breast biopsy Left ~ 01/2015  . Peripheral vascular catheterization Right 06/23/2015    Procedure: PORTA CATH REMOVAL;  Surgeon: Donnie Mesa, MD;  Location: [facility];  Service: General;  Laterality: Right;  . Total mastectomy Left 06/23/2015    Procedure: LEFT MASTECTOMY;  Surgeon: Donnie Mesa, MD;  Location: [facility];  Service: General;  Laterality: Left;    has Hypertrophic obstructive cardiomyopathy (HOCM) ([place]); Hyperlipidemia; Breast cancer of upper-inner quadrant of left female breast ([place]); Neutropenic fever ([place]); Febrile neutropenia ([place]); Chemotherapy induced neutropenia ([place]); Dehydration; Hypokalemia; Hyperbilirubinemia; Hypoalbuminemia due to protein-calorie malnutrition ([place]); Diarrhea; and Cancer associated pain on her problem list.    is allergic to codeine.    Medication List       This list is accurate as of: 01/11/16  2:34 PM.  Always use your most recent med list.  aspirin 81 MG tablet  Take 81 mg by mouth daily.     HYDROcodone-acetaminophen 5-325 MG tablet  Commonly known as:  NORCO/VICODIN  Take 1-2 tablets by mouth every 4 (four) hours as needed for moderate pain.     lidocaine-prilocaine cream  Commonly known as:  EMLA  Apply to affected area once     metoprolol 50 MG tablet  Commonly known as:  LOPRESSOR  Take 1 tablet (50 mg total) by mouth 2 (two) times daily.     multivitamin tablet  Take 1 tablet by mouth daily.     ondansetron 8 MG tablet  Commonly known as:  ZOFRAN  Take 1  tablet (8 mg total) by mouth 2 (two) times daily. Start the day after chemo for 2 days. Then take as needed for nausea or vomiting.     potassium chloride SA 20 MEQ tablet  Commonly known as:  K-DUR,KLOR-CON  Take 1 tablet (20 mEq total) by mouth daily.     prochlorperazine 10 MG tablet  Commonly known as:  COMPAZINE  Take 1 tablet (10 mg total) by mouth every 6 (six) hours as needed (Nausea or vomiting).     traMADol 50 MG tablet  Commonly known as:  ULTRAM  Take 1 tablet (50 mg total) by mouth every 6 (six) hours as needed.         PHYSICAL EXAMINATION  Oncology Vitals 01/11/2016 01/08/2016  Height 160 cm -  Weight 66.316 kg -  Weight (lbs) 146 lbs 3 oz -  BMI (kg/m2) 25.9 kg/m2 -  Temp 98 98.7  Pulse 89 108  Resp 18 20  SpO2 98 99  BSA (m2) 1.72 m2 -   BP Readings from Last 2 Encounters:  01/11/16 110/64  01/08/16 97/54    Physical Exam  Constitutional: She is oriented to person, place, and time. Vital signs are normal. She appears malnourished and dehydrated. She appears unhealthy. She appears cachectic.  Patient continues to appear fatigued, weak, and chronically ill.  HENT:  Head: Normocephalic and atraumatic.  Mouth/Throat: Oropharynx is clear and moist.  Eyes: Conjunctivae and EOM are normal. Pupils are equal, round, and reactive to light. Right eye exhibits no discharge. Left eye exhibits no discharge. No scleral icterus.  Neck: Normal range of motion.  Pulmonary/Chest: Effort normal. No respiratory distress.  Musculoskeletal: Normal range of motion. She exhibits no edema or tenderness.  Neurological: She is alert and oriented to person, place, and time. Gait normal.  Skin: Skin is warm and dry. There is pallor.  Psychiatric: Affect normal.  Nursing note and vitals reviewed.   LABORATORY DATA:. Appointment on 01/11/2016  Component Date Value Ref Range Status  . WBC 01/11/2016 9.7  3.9 - 10.3 10e3/uL Final  . NEUT# 01/11/2016 8.9* 1.5 - 6.5 10e3/uL Final    . HGB 01/11/2016 11.0* 11.6 - 15.9 g/dL Final  . HCT 01/11/2016 33.2* 34.8 - 46.6 % Final  . Platelets 01/11/2016 222  145 - 400 10e3/uL Final  . MCV 01/11/2016 105.6* 79.5 - 101.0 fL Final  . MCH 01/11/2016 34.9* 25.1 - 34.0 pg Final  . MCHC 01/11/2016 33.1  31.5 - 36.0 g/dL Final  . RBC 01/11/2016 3.14* 3.70 - 5.45 10e6/uL Final  . RDW 01/11/2016 18.0* 11.2 - 14.5 % Final  . lymph# 01/11/2016 0.4* 0.9 - 3.3 10e3/uL Final  . MONO# 01/11/2016 0.4  0.1 - 0.9 10e3/uL Final  . Eosinophils Absolute 01/11/2016 0.0  0.0 - 0.5 10e3/uL Final  . Basophils  Absolute 01/11/2016 0.0  0.0 - 0.1 10e3/uL Final  . NEUT% 01/11/2016 91.3* 38.4 - 76.8 % Final  . LYMPH% 01/11/2016 4.6* 14.0 - 49.7 % Final  . MONO% 01/11/2016 4.0  0.0 - 14.0 % Final  . EOS% 01/11/2016 0.1  0.0 - 7.0 % Final  . BASO% 01/11/2016 0.0  0.0 - 2.0 % Final  . Sodium 01/11/2016 132* 136 - 145 mEq/L Final  . Potassium 01/11/2016 4.1  3.5 - 5.1 mEq/L Final  . Chloride 01/11/2016 98  98 - 109 mEq/L Final  . CO2 01/11/2016 20* 22 - 29 mEq/L Final  . Glucose 01/11/2016 97  70 - 140 mg/dl Final   Glucose reference range is for nonfasting patients. Fasting glucose reference range is 70- 100.  Marland Kitchen BUN 01/11/2016 7.5  7.0 - 26.0 mg/dL Final  . Creatinine 01/11/2016 0.6  0.6 - 1.1 mg/dL Final  . Total Bilirubin 01/11/2016 1.26* 0.20 - 1.20 mg/dL Final  . Alkaline Phosphatase 01/11/2016 83  40 - 150 U/L Final  . AST 01/11/2016 42* 5 - 34 U/L Final  . ALT 01/11/2016 17  0 - 55 U/L Final  . Total Protein 01/11/2016 6.1* 6.4 - 8.3 g/dL Final  . Albumin 01/11/2016 2.3* 3.5 - 5.0 g/dL Final  . Calcium 01/11/2016 8.6  8.4 - 10.4 mg/dL Final  . Anion Gap 01/11/2016 14* 3 - 11 mEq/L Final  . EGFR 01/11/2016 >90  >90 ml/min/1.73 m2 Final   eGFR is calculated using the CKD-EPI Creatinine Equation (2009)     RADIOGRAPHIC STUDIES: No results found.  ASSESSMENT/PLAN:    Hypokalemia Patient's potassium last week was 3.3.  Patient has been  taking potassium 20 meq per day since last week; and potassium has improved to 4.1 today.  Patient was encouraged to continue taking the potassium as directed.  Hypoalbuminemia due to protein-calorie malnutrition ([place]) Albumen has decreased from 2.8 down to 2.3.  Patient admits to minimal appetite and poor oral intake.  She was once again encouraged to push.  Multiple small meals throughout the day and to increase her protein intake.  If at all possible.  Also, have arranged for patient to meet with the [place] nutritionist as well.  Hyperbilirubinemia Bilirubin has improved from 1.72, down to 1.26.  Direct bilirubin lab result still pending.  Diarrhea Patient states that all of her diarrhea has since resolved.  She only occasionally takes some Imodium.  Dehydration Patient continues to have minimal appetite, very poor oral intake.  She feels dehydrated.  Once again today.  Patient will receive approximately 1500 ML's normal saline IV fluid rehydration today.  She was also encouraged to push fluids at hme as much as possible.  Cancer associated pain Patient continues to complain of some right upper quadrant discomfort on occasion.  She states that she does take hydrocodone and occasional Ultram as needed.  Patient has a recent diagnosis of liver metastasis; but has no obvious discomfort with palpation to the right upper quadrant area.  Also, there is no obvious liver edge noted on palpation.  Breast cancer of upper-inner quadrant of left female breast [facility]) Patient received cycle 1, day 1 of her Eribulin palliative chemotherapy on 12/26/2015.  Cycle 1, day 8 of the same chemotherapy was held due to neutropenia.  Patient received Neupogen 480 g on 01/02/2016 for growth factor support.  The plan is to also decrease the Eribulin dose as well to prevent further neutropenia issues.  Patient continues to complain of chronic right  breast and right axilla pain.  She feels that the tumor to the  right axilla region has significantly increased within the past few weeks as well.  She also complains of occasional right upper quadrant pain.  Patient was informed that she had new bilateral lung nodules, liver metastasis, and some upper abdominal nodules per her restaging PET scan obtained on 12/12/2015.  Exam today revealed a firm mass to the right outer breast and right axilla region that is somewhat tender with palpation.  Patient denies any recent fever; but does continue with some nightly chills.  She did undergo a fever workup a few weeks ago; with negative urine and blood cultures.  She completed a round of abx prophylactically as well.  Patient will also receive IV fluid rehydration today.  Patient is scheduled to return on Monday, 01/15/2016 for labs, visit, and chemotherapy.    Patient stated understanding of all instructions; and was in agreement with this plan of care. The patient knows to call the clinic with any problems, questions or concerns.   Review/collaboration with Dr. Lindi Adie regarding all aspects of patient's visit today.   Total time spent with patient was 25 minutes;  with greater than 75 percent of that time spent in face to face counseling regarding patient's symptoms,  and coordination of care and follow up.  Disclaimer:This dictation was prepared with Dragon/digital dictation along with [employer]. Any transcriptional errors that result from this process are unintentional.  Drue Second, NP 01/11/2016

## 2016-01-11 NOTE — Assessment & Plan Note (Signed)
Patient's potassium last week was 3.3.  Patient has been taking potassium 20 meq per day since last week; and potassium has improved to 4.1 today.  Patient was encouraged to continue taking the potassium as directed.

## 2016-01-11 NOTE — Assessment & Plan Note (Signed)
Patient states that all of her diarrhea has since resolved.  She only occasionally takes some Imodium.

## 2016-01-11 NOTE — Assessment & Plan Note (Signed)
Patient continues to complain of some right upper quadrant discomfort on occasion.  She states that she does take hydrocodone and occasional Ultram as needed.  Patient has a recent diagnosis of liver metastasis; but has no obvious discomfort with palpation to the right upper quadrant area.  Also, there is no obvious liver edge noted on palpation.

## 2016-01-11 NOTE — Assessment & Plan Note (Signed)
Patient received cycle 1, day 1 of her Eribulin palliative chemotherapy on 12/26/2015.  Cycle 1, day 8 of the same chemotherapy was held due to neutropenia.  Patient received Neupogen 480 g on 01/02/2016 for growth factor support.  The plan is to also decrease the Eribulin dose as well to prevent further neutropenia issues.  Patient continues to complain of chronic right breast and right axilla pain.  She feels that the tumor to the right axilla region has significantly increased within the past few weeks as well.  She also complains of occasional right upper quadrant pain.  Patient was informed that she had new bilateral lung nodules, liver metastasis, and some upper abdominal nodules per her restaging PET scan obtained on 12/12/2015.  Exam today revealed a firm mass to the right outer breast and right axilla region that is somewhat tender with palpation.  Patient denies any recent fever; but does continue with some nightly chills.  She did undergo a fever workup a few weeks ago; with negative urine and blood cultures.  She completed a round of abx prophylactically as well.  Patient will also receive IV fluid rehydration today.  Patient is scheduled to return on Monday, 01/15/2016 for labs, visit, and chemotherapy.

## 2016-01-11 NOTE — Assessment & Plan Note (Signed)
Patient continues to have minimal appetite, very poor oral intake.  She feels dehydrated.  Once again today.  Patient will receive approximately 1500 ML's normal saline IV fluid rehydration today.  She was also encouraged to push fluids at hme as much as possible.

## 2016-01-11 NOTE — Telephone Encounter (Signed)
perp of to sch pt appt-pt aware °

## 2016-01-11 NOTE — Telephone Encounter (Signed)
"  Darcus Austin (sister) calling for [employer].  We think she needs IVF.  We can be there as early as 10:30 am.  She's trying to drink fluids.  The imodium has helped resolve the diarrhea but she's not feeling well.  IVF would be helpful before we get into the weekend.  Do we need an appointment?  Call me at [phone].  I do not think she will answer the phone."   Advised she will need appointment for IVF.

## 2016-01-11 NOTE — Patient Instructions (Signed)

## 2016-01-11 NOTE — Assessment & Plan Note (Signed)
Albumen has decreased from 2.8 down to 2.3.  Patient admits to minimal appetite and poor oral intake.  She was once again encouraged to push.  Multiple small meals throughout the day and to increase her protein intake.  If at all possible.  Also, have arranged for patient to meet with the [place] nutritionist as well.

## 2016-01-11 NOTE — Assessment & Plan Note (Signed)
Bilirubin has improved from 1.72, down to 1.26.  Direct bilirubin lab result still pending.

## 2016-01-12 LAB — BILIRUBIN, DIRECT: Bilirubin, Direct: 0.42 mg/dL — ABNORMAL HIGH (ref 0.00–0.40)

## 2016-01-15 ENCOUNTER — Other Ambulatory Visit (HOSPITAL_BASED_OUTPATIENT_CLINIC_OR_DEPARTMENT_OTHER): Payer: Medicare Other

## 2016-01-15 ENCOUNTER — Telehealth: Payer: Self-pay | Admitting: *Deleted

## 2016-01-15 ENCOUNTER — Encounter: Payer: Self-pay | Admitting: Hematology and Oncology

## 2016-01-15 ENCOUNTER — Ambulatory Visit (HOSPITAL_COMMUNITY)
Admission: RE | Admit: 2016-01-15 | Discharge: 2016-01-15 | Disposition: A | Discharge status: Home / Self Care | Payer: Medicare Other | Source: Ambulatory Visit | Attending: Hematology and Oncology | Admitting: Hematology and Oncology

## 2016-01-15 ENCOUNTER — Ambulatory Visit (HOSPITAL_BASED_OUTPATIENT_CLINIC_OR_DEPARTMENT_OTHER): Payer: Medicare Other | Admitting: Hematology and Oncology

## 2016-01-15 ENCOUNTER — Other Ambulatory Visit: Payer: Self-pay | Admitting: *Deleted

## 2016-01-15 ENCOUNTER — Ambulatory Visit: Payer: Medicare Other | Admitting: Nutrition

## 2016-01-15 ENCOUNTER — Ambulatory Visit (HOSPITAL_BASED_OUTPATIENT_CLINIC_OR_DEPARTMENT_OTHER): Payer: Medicare Other

## 2016-01-15 ENCOUNTER — Encounter: Payer: Self-pay | Admitting: *Deleted

## 2016-01-15 ENCOUNTER — Telehealth: Payer: Self-pay | Admitting: Hematology and Oncology

## 2016-01-15 ENCOUNTER — Other Ambulatory Visit: Payer: Self-pay | Admitting: Hematology and Oncology

## 2016-01-15 VITALS — BP 133/66 | HR 130 | Temp 97.8°F | Resp 19 | Ht 63.0 in | Wt 145.1 lb

## 2016-01-15 VITALS — BP 121/73 | HR 115 | Temp 98.2°F | Resp 18

## 2016-01-15 DIAGNOSIS — J90 Pleural effusion, not elsewhere classified: Secondary | ICD-10-CM | POA: Insufficient documentation

## 2016-01-15 DIAGNOSIS — C50212 Malignant neoplasm of upper-inner quadrant of left female breast: Secondary | ICD-10-CM | POA: Diagnosis not present

## 2016-01-15 DIAGNOSIS — E860 Dehydration: Secondary | ICD-10-CM

## 2016-01-15 DIAGNOSIS — Z9012 Acquired absence of left breast and nipple: Secondary | ICD-10-CM | POA: Insufficient documentation

## 2016-01-15 DIAGNOSIS — F329 Major depressive disorder, single episode, unspecified: Secondary | ICD-10-CM | POA: Diagnosis not present

## 2016-01-15 DIAGNOSIS — R590 Localized enlarged lymph nodes: Secondary | ICD-10-CM | POA: Insufficient documentation

## 2016-01-15 DIAGNOSIS — I2699 Other pulmonary embolism without acute cor pulmonale: Secondary | ICD-10-CM | POA: Insufficient documentation

## 2016-01-15 DIAGNOSIS — C7800 Secondary malignant neoplasm of unspecified lung: Secondary | ICD-10-CM | POA: Diagnosis not present

## 2016-01-15 DIAGNOSIS — C787 Secondary malignant neoplasm of liver and intrahepatic bile duct: Secondary | ICD-10-CM | POA: Diagnosis not present

## 2016-01-15 DIAGNOSIS — R000 Tachycardia, unspecified: Secondary | ICD-10-CM | POA: Diagnosis present

## 2016-01-15 DIAGNOSIS — C50919 Malignant neoplasm of unspecified site of unspecified female breast: Secondary | ICD-10-CM | POA: Insufficient documentation

## 2016-01-15 DIAGNOSIS — R0600 Dyspnea, unspecified: Secondary | ICD-10-CM | POA: Diagnosis not present

## 2016-01-15 LAB — CBC WITH DIFFERENTIAL/PLATELET
BASO%: 0.6 % (ref 0.0–2.0)
Basophils Absolute: 0 10*3/uL (ref 0.0–0.1)
EOS%: 0.2 % (ref 0.0–7.0)
Eosinophils Absolute: 0 10*3/uL (ref 0.0–0.5)
HEMATOCRIT: 35.2 % (ref 34.8–46.6)
HGB: 11.5 g/dL — ABNORMAL LOW (ref 11.6–15.9)
LYMPH#: 0.3 10*3/uL — AB (ref 0.9–3.3)
LYMPH%: 7.2 % — ABNORMAL LOW (ref 14.0–49.7)
MCH: 33.9 pg (ref 25.1–34.0)
MCHC: 32.7 g/dL (ref 31.5–36.0)
MCV: 103.8 fL — ABNORMAL HIGH (ref 79.5–101.0)
MONO#: 0.4 10*3/uL (ref 0.1–0.9)
MONO%: 8 % (ref 0.0–14.0)
NEUT%: 84 % — ABNORMAL HIGH (ref 38.4–76.8)
NEUTROS ABS: 4.1 10*3/uL (ref 1.5–6.5)
PLATELETS: 238 10*3/uL (ref 145–400)
RBC: 3.39 10*6/uL — AB (ref 3.70–5.45)
RDW: 18.6 % — ABNORMAL HIGH (ref 11.2–14.5)
WBC: 4.8 10*3/uL (ref 3.9–10.3)

## 2016-01-15 LAB — COMPREHENSIVE METABOLIC PANEL
ALT: 27 U/L (ref 0–55)
ANION GAP: 16 meq/L — AB (ref 3–11)
AST: 68 U/L — AB (ref 5–34)
Albumin: 2.5 g/dL — ABNORMAL LOW (ref 3.5–5.0)
Alkaline Phosphatase: 127 U/L (ref 40–150)
BILIRUBIN TOTAL: 0.95 mg/dL (ref 0.20–1.20)
BUN: 8.4 mg/dL (ref 7.0–26.0)
CO2: 20 meq/L — AB (ref 22–29)
CREATININE: 0.7 mg/dL (ref 0.6–1.1)
Calcium: 9 mg/dL (ref 8.4–10.4)
Chloride: 97 mEq/L — ABNORMAL LOW (ref 98–109)
EGFR: >90 mL/min/{1.73_m2} (ref >90)
Glucose: 122 mg/dl (ref 70–140)
Potassium: 4.2 mEq/L (ref 3.5–5.1)
Sodium: 132 mEq/L — ABNORMAL LOW (ref 136–145)
TOTAL PROTEIN: 6.2 g/dL — AB (ref 6.4–8.3)

## 2016-01-15 MED ORDER — SODIUM CHLORIDE 0.9 % IV SOLN
Freq: Once | INTRAVENOUS | Status: AC
  Administered 2016-01-15: 14:00:00 via INTRAVENOUS
  Filled 2016-01-15: qty 1000

## 2016-01-15 MED ORDER — RIVAROXABAN (XARELTO) VTE STARTER PACK (15 & 20 MG): 15.0000 mg | ORAL_TABLET | Freq: Two times a day (BID) | ORAL | Status: AC

## 2016-01-15 MED ORDER — TRAMADOL HCL 50 MG PO TABS: 50.0000 mg | ORAL_TABLET | Freq: Four times a day (QID) | ORAL | Status: AC | PRN

## 2016-01-15 MED ORDER — RIVAROXABAN (XARELTO) VTE STARTER PACK (15 & 20 MG)
15.0000 mg | ORAL_TABLET | Freq: Two times a day (BID) | ORAL | Status: DC
Start: 2016-01-15 — End: 2016-01-15

## 2016-01-15 MED ORDER — IOHEXOL 350 MG/ML SOLN
100.0000 mL | Freq: Once | INTRAVENOUS | Status: AC | PRN
  Administered 2016-01-15: 100 mL via INTRAVENOUS

## 2016-01-15 MED FILL — XARELTO STARTER PACK: 15 & 20 | 30 days supply | Qty: 51 | Fill #0

## 2016-01-15 MED FILL — traMADol HCL 50 MG TABS: 50 | 15 days supply | Qty: 60 | Fill #0

## 2016-01-15 NOTE — Assessment & Plan Note (Signed)
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 Molli Posey on 08/21/2015 Adjuvant radiation therapy with concurrent Xeloda completed 10/23/2015 Xeloda (based on results from CREATE-X study) 2000 mg by mouth twice a day discontinued 12/14/2015 PET CT scan 44/02/[id number] hypermetabolic disease to the right axilla (largest 4.7 cm laminated mass), lungs (6 mm Rt, 1.8 cm LLL nodules), liver (6X 7.1 cm; 6.4 x 4.8 cm), abdominal lymph nodes ( 1cm porta hepatis LN, left peri-aortic RP LN 5 mm), left lamina of T12, bilateral femoral head AVN -----------------------------------------------------------------------------------------------------------------------------------------------  Plan to treatment: [place] days 1 and 8 q 3 weeks started 12/26/15  Recurrent fevers: We obtained blood cultures and urine analysis, which were all normal. We treated her on antibiotic with Augmentin and this did not resolve her fevers. I believe her fevers are related to her malignancy.   Patient wanted to go on the Pembrolizumab clinical trial but because the trial is taking time to open, we decided to start chemotherapy.  [place] toxicities:  1. Neutropenia on cycle 1 day 8: We reduced the dosage of Haldol and and gave her Neupogen injection. Today is cycle 1 day 8. 2. Fevers 3. Fatigue  Return to clinic in 1 week for cycle 2.

## 2016-01-15 NOTE — Progress Notes (Signed)
65 year old female diagnosed with metastatic breast cancer.  She is a patient of Dr. Sonny Dandy.   Past medical history includes hyperlipidemia, vitamin D deficiency, osteopenia, and hypertension.  Medications include multivitamin, Zofran, K-dur, and Compazine.  Labs include sodium 132 and albumin 2.3 on February 2.  Height: 63 inches. Weight: 145 pounds February 6. Usual body weight: 160 pounds December 2016. BMI: 25.71.  Patient reports poor appetite but denies nausea, vomiting, constipation or diarrhea. She has experienced dehydration despite stating she is drinking more water. She has increased fatigue/malaise. Patient reports occasional pain. She is drinking premier protein, 1-2 bottles daily. Daily oral intake reveals 1-2 small snacks daily.  Nutrition diagnosis:  Inadequate oral intake related to metastatic cancer as evidenced by 9% weight loss over 2 months.  Intervention: Educated patient to consume small frequent meals and snacks every 2 hours. Reviewed high-calorie high-protein easy to eat foods. Reviewed importance of increasing hydration but suggested oral nutrition supplements  and other liquids that have calories and protein. Provided samples of various oral nutrition supplements and coupons. Recommended patient try to drink two "plus" supplements and two premier protein drinks daily. Questions were answered.  Fact sheets were provided.  Contact information was given.  Questions were answered.  Monitoring, evaluation, goals: Patient will increase oral intake and hydration, to improve quality-of-life.  Next visit: To be scheduled with treatment as needed.  Patient will contact me by phone if she develops questions or concerns.  **Disclaimer: This note was dictated with voice recognition software. Similar sounding words can inadvertently be transcribed and this note may contain transcription errors which may not have been corrected upon publication of note.**

## 2016-01-15 NOTE — Telephone Encounter (Signed)
Per staff message and POF I have scheduled appts. Advised scheduler of appts. JMW  

## 2016-01-15 NOTE — Patient Instructions (Signed)

## 2016-01-15 NOTE — Progress Notes (Signed)
Patient Care Team: Antony Blackbird, MD as PCP - General (Family Medicine)  DIAGNOSIS: Breast cancer of upper-inner quadrant of left female breast [facility])   Staging form: Breast, AJCC 7th Edition     Clinical: Stage IIIB (T4b, N0, M0) - Signed by Rulon Eisenmenger, MD on 01/17/2015   SUMMARY OF ONCOLOGIC HISTORY:   Breast cancer of upper-inner quadrant of left female breast ([place])   12/27/2014 Initial Diagnosis Grade 3 invasive mammary cancer, one lymph node negative in left axilla, ER 0%, PR 0%, Ki-67 98%, HER-2 negative ratio 1.38   01/06/2015 Breast MRI Left breast large enhancing mass, evidence of necrosis, extending from posterior through the middle third of left breast, extending from chest wall to the dermis, close to pectoralis fascia extending 4.5 cm, several bilateral axillary lymph nodes   01/17/2015 - 05/30/2015 Neo-Adjuvant Chemotherapy Neoadjuvant chemotherapy with dose dense Adriamycin and Cytoxan followed by Taxol and carboplatin   01/24/2015 - 01/27/2015 Hospital Admission Neutropenic fever admission, no source identified   06/08/2015 Breast MRI Interval erosion of the known carcinoma within the superior left breast through the overlying skin. The majority of the mass is now exophytic, decrease in Left Axill LN   06/23/2015 Surgery Left simple mastectomy: Invasive ductal carcinoma grade 3; 6.5 cm, lymphovascular invasion, margins negative, 0/3 lymph nodes negative, T4b N0M0 stage IIIB pathologic staging, ER 0%, PR 0%, HER-2 negative, Ki-67 98%   08/21/2015 Relapse/Recurrence Chest wall recurrence excision positive for breast cancer HER-2 negative   09/05/2015 - 10/23/2015 Radiation Therapy Radiation with Xeloda 1000 mg by mouth twice a day   11/09/2015 -  Chemotherapy Xeloda 1500 bid increased to 2000 mg bid starting 11/30/15   11/24/2015 Mammogram Palpable right axillary lymph node: Mammogram and ultrasound revealed group of abnormal appearing lymph nodes largest measures 3.3 x 1.8 x 1.9 cm   12/12/2015 PET scan Extensive hypermetabolic disease to the right axilla, lungs, liver, abdominal lymph nodes, left lamina of T12, bilateral femoral head AVN   12/26/2015 -  Chemotherapy Palliative chemo with Halaven days 1 and 8 q 3 weeks    CHIEF COMPLIANT: dehydration and weakness  INTERVAL HISTORY: Patty Buckley is a 65 year old with above-mentioned history of triple negative breast cancer who was on Halaven chemotherapy but developed profound neutropenia followed by generalized weakness and dehydration. She recovered after getting IV fluids twice last week. However today once again she feels weak and had to use a wheelchair for the first time for ambulation. She is also complaining of shortness of breath to minimal exertion. She is also tachycardic.  REVIEW OF SYSTEMS:   Constitutional: Denies fevers, chills or abnormal weight loss, generalized frailty and weakness Eyes: Denies blurriness of vision Ears, nose, mouth, throat, and face: Denies mucositis or sore throat Respiratory: Denies cough, dyspnea or wheezes Cardiovascular: tachycardia Gastrointestinal:  Denies nausea, heartburn or change in bowel habits Skin: Denies abnormal skin rashes Lymphatics: Denies new lymphadenopathy or easy bruising Neurological:Denies numbness, tingling or new weaknesses Behavioral/Psych: Mood is stable, no new changes  Extremities: No lower extremity edema  All other systems were reviewed with the patient and are negative.  I have reviewed the past medical history, past surgical history, social history and family history with the patient and they are unchanged from previous note.  ALLERGIES:  is allergic to codeine.  MEDICATIONS:  Current Outpatient Prescriptions  Medication Sig Dispense Refill  . aspirin 81 MG tablet Take 81 mg by mouth daily.      Marland Kitchen HYDROcodone-acetaminophen (NORCO/VICODIN) 5-325  MG tablet Take 1-2 tablets by mouth every 4 (four) hours as needed for moderate pain. 60 tablet 0  .  lidocaine-prilocaine (EMLA) cream Apply to affected area once 30 g 3  . metoprolol (LOPRESSOR) 50 MG tablet Take 1 tablet (50 mg total) by mouth 2 (two) times daily. 180 tablet 3  . Multiple Vitamin (MULTIVITAMIN) tablet Take 1 tablet by mouth daily.     . ondansetron (ZOFRAN) 8 MG tablet Take 1 tablet (8 mg total) by mouth 2 (two) times daily. Start the day after chemo for 2 days. Then take as needed for nausea or vomiting. 30 tablet 1  . potassium chloride SA (K-DUR,KLOR-CON) 20 MEQ tablet Take 1 tablet (20 mEq total) by mouth daily. 10 tablet 0  . prochlorperazine (COMPAZINE) 10 MG tablet Take 1 tablet (10 mg total) by mouth every 6 (six) hours as needed (Nausea or vomiting). 30 tablet 1  . traMADol (ULTRAM) 50 MG tablet Take 1 tablet (50 mg total) by mouth every 6 (six) hours as needed. 60 tablet 0   No current facility-administered medications for this visit.    PHYSICAL EXAMINATION: ECOG PERFORMANCE STATUS: 2 - Symptomatic, <50% confined to bed  Filed Vitals:   01/15/16 1138  BP: 133/66  Pulse: 130  Temp: 97.8 F (36.6 C)  Resp: 19   Filed Weights   01/15/16 1138  Weight: 145 lb 1.6 oz (65.817 kg)    GENERAL:alert, no distress and comfortable SKIN: skin color, texture, turgor are normal, no rashes or significant lesions EYES: normal, Conjunctiva are pink and non-injected, sclera clear OROPHARYNX:no exudate, no erythema and lips, buccal mucosa, and tongue normal  NECK: supple, thyroid normal size, non-tender, without nodularity LYMPH:  no palpable lymphadenopathy in the cervical, axillary or inguinal LUNGS: clear to auscultation and percussion with normal breathing effort HEART: regular rate & rhythm and no murmurs and no lower extremity edema ABDOMEN:abdomen soft, non-tender and normal bowel sounds MUSCULOSKELETAL:no cyanosis of digits and no clubbing  NEURO: alert & oriented x 3 with fluent speech, no focal motor/sensory deficits EXTREMITIES: No lower extremity  edema   LABORATORY DATA:  I have reviewed the data as listed   Chemistry      Component Value Date/Time   NA 132* 01/15/2016 1116   NA 137 06/24/2015 0550   K 4.2 01/15/2016 1116   K 4.2 06/24/2015 0550   CL 106 06/24/2015 0550   CO2 20* 01/15/2016 1116   CO2 24 06/24/2015 0550   BUN 8.4 01/15/2016 1116   BUN 7 06/24/2015 0550   CREATININE 0.7 01/15/2016 1116   CREATININE 0.73 06/24/2015 0550      Component Value Date/Time   CALCIUM 9.0 01/15/2016 1116   CALCIUM 8.3* 06/24/2015 0550   ALKPHOS 127 01/15/2016 1116   ALKPHOS 58 06/24/2015 0550   AST 68* 01/15/2016 1116   AST 16 06/24/2015 0550   ALT 27 01/15/2016 1116   ALT 18 06/24/2015 0550   BILITOT 0.95 01/15/2016 1116   BILITOT 0.2* 06/24/2015 0550       Lab Results  Component Value Date   WBC 4.8 01/15/2016   HGB 11.5* 01/15/2016   HCT 35.2 01/15/2016   MCV 103.8* 01/15/2016   PLT 238 01/15/2016   NEUTROABS 4.1 01/15/2016     ASSESSMENT & PLAN:  Breast cancer of upper-inner quadrant of left female breast ([facility]) 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 Molli Posey on 08/21/2015 Adjuvant radiation therapy with concurrent Xeloda completed 10/23/2015 Xeloda (based on results from CREATE-X study) 2000 mg by mouth twice a day discontinued 12/14/2015 PET CT scan [id number] hypermetabolic disease to the right axilla (largest 4.7 cm laminated mass), lungs (6 mm Rt, 1.8 cm LLL nodules), liver (6X 7.1 cm; 6.4 x 4.8 cm), abdominal lymph  nodes ( 1cm porta hepatis LN, left peri-aortic RP LN 5 mm), left lamina of T12, bilateral femoral head AVN -----------------------------------------------------------------------------------------------------------------------------------------------  Plan to treatment: Halaven days 1 and 8 q 3 weeks started 12/26/15  Recurrent fevers: We obtained blood cultures and urine analysis, which were all normal. We treated her on antibiotic with Augmentin and this did not resolve her fevers. I believe her fevers are related to her malignancy.   Patient wanted to go on the Pembrolizumab clinical trial but because the trial is taking time to open, we decided to start chemotherapy.  Halaven toxicities:  1. Neutropenia on cycle 1 day 8: We reduced the dosage of Haldol and and gave her Neupogen injection. Today is cycle 1 day 8. 2. Fevers 3. Fatigue 4. Profound dehydration: Required IV fluids 2 times last week. She continues to be dehydrated with tachycardia. This is mainly because of inadequate fluid and food intake. Would like to give her IV fluids 3 times this week.  Acute dyspnea: I'm concerned about pulmonary embolism. We will like to obtain a stat CT chest with contrast with PE protocol to evaluate this further today.  Depression/emotional exhaustion: Patient tells me today that she is not eating much lately because she had lost all interest in life. Her friend who accompanied her today is trying to get her to eat more food. Patient's disease course has been extremely poor in the sense that no treatment appears to be working. With the last Halaven treatment patient got significantly worse.  Decision: If she recovers her performance status by next week, then we will consider her for Pembrolizumab clinical trial. If she does not recover and continues to remain frail, I would then recommend discontinuing further treatment plans and considering hospice care.   Return to clinic in 1 week for evaluation     Orders Placed This Encounter  Procedures  . CT Angio Chest PE W/Cm &/Or Wo Cm    Standing Status: Future     Number of Occurrences: 1     Standing Expiration Date: 04/16/2017    Order Specific Question:  Reason for exam:    Answer:  Acute shortness of breath with tachycardia     Comments:  metastatic breast cancer    Order Specific Question:  Preferred imaging location?    Answer:  [facility]   The patient has a good understanding of the overall plan. she agrees with it. she will call with any problems that may develop before the next visit here.   Rulon Eisenmenger, MD 01/15/2016

## 2016-01-15 NOTE — Telephone Encounter (Signed)
per pof to sch pt appt-sent MW email to sch pt trmt per pof-willc all pt after reply °

## 2016-01-16 ENCOUNTER — Other Ambulatory Visit: Payer: Medicare Other

## 2016-01-16 ENCOUNTER — Telehealth: Payer: Self-pay | Admitting: Hematology and Oncology

## 2016-01-16 ENCOUNTER — Ambulatory Visit: Payer: Medicare Other

## 2016-01-16 ENCOUNTER — Telehealth: Payer: Self-pay | Admitting: *Deleted

## 2016-01-16 NOTE — Telephone Encounter (Signed)
"  This is Darcus Austin calling in reference to my sister.  I have questions about her condition and wouldl ike to talk with Dr. Lindi Adie briefly about these questions.  Return number [phone]."

## 2016-01-16 NOTE — Telephone Encounter (Signed)
Added f/u 2/14 appointments per 2nd 2/6 pof - fluids for 2/8, 2/10, and 2/13 already on schdule. Left message for patient confirming all appointment and patient to get new schedule 2/8.

## 2016-01-16 NOTE — Telephone Encounter (Signed)
cld pt and left message of ivf appt times & dates

## 2016-01-17 ENCOUNTER — Ambulatory Visit (HOSPITAL_BASED_OUTPATIENT_CLINIC_OR_DEPARTMENT_OTHER): Payer: Medicare Other

## 2016-01-17 VITALS — BP 116/65 | HR 101 | Temp 97.5°F | Resp 18

## 2016-01-17 DIAGNOSIS — C50212 Malignant neoplasm of upper-inner quadrant of left female breast: Secondary | ICD-10-CM

## 2016-01-17 DIAGNOSIS — E876 Hypokalemia: Secondary | ICD-10-CM | POA: Diagnosis not present

## 2016-01-17 MED ORDER — SODIUM CHLORIDE 0.9 % IV SOLN
Freq: Once | INTRAVENOUS | Status: AC
  Administered 2016-01-17: 15:00:00 via INTRAVENOUS
  Filled 2016-01-17: qty 1000

## 2016-01-17 MED ORDER — SODIUM CHLORIDE 0.9% FLUSH
10.0000 mL | INTRAVENOUS | Status: DC | PRN
  Administered 2016-01-17: 10 mL via INTRAVENOUS
  Filled 2016-01-17: qty 10

## 2016-01-17 MED ORDER — HEPARIN SOD (PORK) LOCK FLUSH 100 UNIT/ML IV SOLN
500.0000 [IU] | Freq: Once | INTRAVENOUS | Status: AC
  Administered 2016-01-17: 500 [IU] via INTRAVENOUS
  Filled 2016-01-17: qty 5

## 2016-01-17 NOTE — Patient Instructions (Signed)
Hypokalemia Hypokalemia means that the amount of potassium in the blood is lower than normal.Potassium is a chemical, called an electrolyte, that helps regulate the amount of fluid in the body. It also stimulates muscle contraction and helps nerves function properly.Most of the body's potassium is inside of cells, and only a very small amount is in the blood. Because the amount in the blood is so small, minor changes can be life-threatening. CAUSES  Antibiotics.  Diarrhea or vomiting.  Using laxatives too much, which can cause diarrhea.  Chronic kidney disease.  Water pills (diuretics).  Eating disorders (bulimia).  Low magnesium level.  Sweating a lot. SIGNS AND SYMPTOMS  Weakness.  Constipation.  Fatigue.  Muscle cramps.  Mental confusion.  Skipped heartbeats or irregular heartbeat (palpitations).  Tingling or numbness. DIAGNOSIS  Your health care provider can diagnose hypokalemia with blood tests. In addition to checking your potassium level, your health care provider may also check other lab tests. TREATMENT Hypokalemia can be treated with potassium supplements taken by mouth or adjustments in your current medicines. If your potassium level is very low, you may need to get potassium through a vein (IV) and be monitored in the hospital. A diet high in potassium is also helpful. Foods high in potassium are:  Nuts, such as peanuts and pistachios.  Seeds, such as sunflower seeds and pumpkin seeds.  Peas, lentils, and lima beans.  Whole grain and bran cereals and breads.  Fresh fruit and vegetables, such as apricots, avocado, bananas, cantaloupe, kiwi, oranges, tomatoes, asparagus, and potatoes.  Orange and tomato juices.  Red meats.  Fruit yogurt. HOME CARE INSTRUCTIONS  Take all medicines as prescribed by your health care provider.  Maintain a healthy diet by including nutritious food, such as fruits, vegetables, nuts, whole grains, and lean meats.  If  you are taking a laxative, be sure to follow the directions on the label. SEEK MEDICAL CARE IF:  Your weakness gets worse.  You feel your heart pounding or racing.  You are vomiting or having diarrhea.  You are diabetic and having trouble keeping your blood glucose in the normal range. SEEK IMMEDIATE MEDICAL CARE IF:  You have chest pain, shortness of breath, or dizziness.  You are vomiting or having diarrhea for more than 2 days.  You faint. MAKE SURE YOU:   Understand these instructions.  Will watch your condition.  Will get help right away if you are not doing well or get worse.   This information is not intended to replace advice given to you by your health care provider. Make sure you discuss any questions you have with your health care provider.   Document Released: 11/25/2005 Document Revised: 12/16/2014 Document Reviewed: 05/28/2013 Elsevier Interactive Patient Education 2016 [employer].   Dehydration in Sports Dehydration is a condition in which you do not have enough fluid or water in your body. Your body needs a certain amount of water and other fluid to maintain its blood volume. During exercise, your body may not be able to maintain the fluid levels that are needed to function properly. Dehydration happens when you take in less fluid than you lose. Athletes lose fluid during exercise when they sweat and breathe. Additional fluid is lost during urination, vomiting, and diarrhea. To prevent dehydration, it is important for athletes to take in enough water and fluid to replace the fluid that they lose during exercise. CAUSES Common causes of dehydration among athletes include:  Diarrhea.  Vomiting.  Not drinking enough fluid during  strenuous exercise or during an illness.  Not eating enough food during strenuous exercise or during an illness.  Not consuming enough fluid or food after strenuous exercise.  Exercising in hot or humid weather. RISK  FACTORS This condition is more likely to develop in:  Athletes who are taking certain medicines that cause the body to lose excess fluid (diuretics).  Athletes who have a chronic illness, such as diabetes.  Young children.  Older adults.  Athletes who live at high altitudes.  Endurance athletes. SYMPTOMS  Mild Dehydration  Thirst.  Dry lips.  Slightly dry mouth.  Dry, warm skin. Moderate Dehydration  Very dry mouth.  Muscle cramps.  Dark urine and decreased urine production.  Decreased tear production.  Headache.  Light-headedness, especially when you stand up from a sitting position. Severe Dehydration  Changes in skin.  Blue lips.  Skin does not spring back quickly when lightly pinched and released.  Changes in body fluids.  Extreme thirst.  No tears.  Not able to sweat when body temperature is high, such as in hot weather.  Minimal urine production.  Changes in vital signs.  Rapid, weak pulse (more than 100 beats per minute when you are sitting still).  Rapid breathing.  Low blood pressure.  Unconsciousness.  Other changes.  Sunken eyes.  Cold hands and feet.  Confusion and lethargy.  Difficulty being awakened.  Fainting (syncope).  Short-term weight loss. DIAGNOSIS This condition may be diagnosed based on your symptoms. You may also have tests to determine how severe your dehydration is. These tests may include:  Urine tests.  Blood tests. TREATMENT Dehydration should be treated right away. Do not wait until dehydration becomes severe. Treatment depends on the severity of the dehydration. Treatment for Mild Dehydration  Drinking plenty of water to replace the fluid you have lost.  Replacing minerals in your blood (electrolytes) that you may have lost. Treatment for Moderate Dehydration  Consuming oral rehydration solution (ORS). Treatment for Severe Dehydration  Receiving fluid through an IV tube.  Receiving  electrolyte solution through a feeding tube that is passed through your nose and into your stomach (nasogastric tube or NG tube). HOME CARE INSTRUCTIONS  Drink enough fluid to keep your urine clear or pale yellow.  Drink water or fluid slowly by taking small sips.  Have food or beverages that contain electrolytes. Examples include salt, bananas, and sports drinks.  Take over-the-counter and prescription medicines only as told by your health care provider.  Prepare ORS according to the manufacturer's instructions. Take sips of ORS every 5 minutes until your urine returns to normal.  If you have vomiting or diarrhea, continue to try to drink water, ORS, or both.  If you have diarrhea, avoid:  Beverages that contain caffeine.  Fruit juice.  Milk.  Carbonated soft drinks.  Do not take salt tablets. This can lead to the condition of having too much sodium in your body (hypernatremia). PREVENTION  Drink water before, during, and after physical activity, even if you do not feel thirsty. Drink small amounts of water frequently throughout sporting events. Drink more water if you are exercising in hot or humid weather or in high altitudes.  If you are exercising for more than an hour, consider drinking a sports drink.  If you are experiencing vomiting or diarrhea, avoid exercise.  Before and after exercise, eat plenty of foods that have a high water content. These include fruits and vegetables.  Avoid alcohol before, during, and after strenuous exercise. SEEK  MEDICAL CARE IF:  You cannot eat or drink without vomiting.  You have severe diarrhea with vomiting or a fever.  You have severe diarrhea without vomiting or a fever.  You have had moderate diarrhea during a period of more than 24 hours. SEEK IMMEDIATE MEDICAL CARE IF:  You have extreme thirst.  You have not urinated in 6-8 hours, or you have urinated only a small amount of very dark urine.  You have shriveled  skin.  You are dizzy, confused, or both.   This information is not intended to replace advice given to you by your health care provider. Make sure you discuss any questions you have with your health care provider.   Document Released: 11/25/2005 Document Revised: 08/16/2015 Document Reviewed: 12/09/2014 Elsevier Interactive Patient Education [employer].

## 2016-01-17 NOTE — Progress Notes (Signed)
Pt received IVF over 1 hr. VSS remained stable. Pt states mild improvement in symptoms of fatigue. Pt is aware of future appt and declined avs at this time.

## 2016-01-18 ENCOUNTER — Other Ambulatory Visit: Payer: Self-pay | Admitting: *Deleted

## 2016-01-18 ENCOUNTER — Telehealth: Payer: Self-pay | Admitting: Hematology and Oncology

## 2016-01-18 NOTE — Telephone Encounter (Signed)
Per 2/9 pof moved 2/14 office visit to 2/13. Left message for change and confirmed date/times for 2/10, 2/13 and 2/14. Patient to get new schedule tomorrow.

## 2016-01-19 ENCOUNTER — Ambulatory Visit (HOSPITAL_BASED_OUTPATIENT_CLINIC_OR_DEPARTMENT_OTHER): Payer: Medicare Other

## 2016-01-19 VITALS — BP 117/72 | HR 93 | Temp 97.0°F | Resp 18

## 2016-01-19 DIAGNOSIS — C50212 Malignant neoplasm of upper-inner quadrant of left female breast: Secondary | ICD-10-CM

## 2016-01-19 DIAGNOSIS — E876 Hypokalemia: Secondary | ICD-10-CM | POA: Diagnosis not present

## 2016-01-19 MED ORDER — HEPARIN SOD (PORK) LOCK FLUSH 100 UNIT/ML IV SOLN
500.0000 [IU] | Freq: Once | INTRAVENOUS | Status: AC
  Administered 2016-01-19: 500 [IU] via INTRAVENOUS
  Filled 2016-01-19: qty 5

## 2016-01-19 MED ORDER — SODIUM CHLORIDE 0.9 % IV SOLN
INTRAVENOUS | Status: DC
  Administered 2016-01-19: 15:00:00 via INTRAVENOUS
  Filled 2016-01-19: qty 1000

## 2016-01-19 MED ORDER — SODIUM CHLORIDE 0.9% FLUSH
10.0000 mL | INTRAVENOUS | Status: DC | PRN
  Administered 2016-01-19: 10 mL via INTRAVENOUS
  Filled 2016-01-19: qty 10

## 2016-01-19 NOTE — Patient Instructions (Signed)

## 2016-01-19 NOTE — Progress Notes (Signed)
Post vital signs stable. Patient discharged home via wheelchair.  Patient has no concerns or complaints.

## 2016-01-21 NOTE — Assessment & Plan Note (Signed)
Breast cancer of upper-inner quadrant of left female breast ([place]) 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: T4bN?Mx clinical stage IIIB ER 0%, PR 0%, HER-2 negative ratio 1.38, Ki-67 98%  S/P Neoadjuvant chemo AC x 12 foll by Taxol- Carbo x 12 weekly completed 05/30/15  Left simple mastectomy 06/23/2015: Invasive ductal carcinoma grade 3; 6.5 cm, lymphovascular invasion, margins negative, 0/3 lymph nodes negative, T4b N0M0 stage IIIB pathologic staging, ER 0%, PR 0%, HER-2 negative, Ki-67 98%  Chest wall recurrence: Status post excision by Dr. Molli Posey on 08/21/2015 Adjuvant radiation therapy with concurrent Xeloda completed 10/23/2015 Xeloda (based on results from CREATE-X study) 2000 mg by mouth twice a day discontinued 12/14/2015 PET CT scan 44/92/0100:[id number] hypermetabolic disease to the right axilla (largest 4.7 cm laminated mass), lungs (6 mm Rt, 1.8 cm LLL nodules), liver (6X 7.1 cm; 6.4 x 4.8 cm), abdominal lymph nodes ( 1cm porta hepatis LN, left peri-aortic RP LN 5 mm), left lamina of T12, bilateral femoral head AVN  Treatment: Halaven started 12/26/15 D/Ced after 1 cycle -------------------------------------------------------------------------------------------------------------------------- Acute PE: on Xarelto started 01/15/16 CT 01/15/16: Progression of disease  Rec fevers Depression:  RTC

## 2016-01-22 ENCOUNTER — Other Ambulatory Visit (HOSPITAL_BASED_OUTPATIENT_CLINIC_OR_DEPARTMENT_OTHER): Payer: Medicare Other

## 2016-01-22 ENCOUNTER — Ambulatory Visit (HOSPITAL_BASED_OUTPATIENT_CLINIC_OR_DEPARTMENT_OTHER): Payer: Medicare Other

## 2016-01-22 ENCOUNTER — Ambulatory Visit (HOSPITAL_BASED_OUTPATIENT_CLINIC_OR_DEPARTMENT_OTHER): Payer: Medicare Other | Admitting: Hematology and Oncology

## 2016-01-22 ENCOUNTER — Other Ambulatory Visit: Payer: Self-pay

## 2016-01-22 ENCOUNTER — Encounter: Payer: Self-pay | Admitting: Hematology and Oncology

## 2016-01-22 ENCOUNTER — Ambulatory Visit: Payer: Medicare Other

## 2016-01-22 VITALS — BP 112/78 | HR 136 | Temp 98.5°F | Resp 22 | Ht 63.0 in

## 2016-01-22 DIAGNOSIS — R5383 Other fatigue: Secondary | ICD-10-CM

## 2016-01-22 DIAGNOSIS — Z95828 Presence of other vascular implants and grafts: Secondary | ICD-10-CM

## 2016-01-22 DIAGNOSIS — C787 Secondary malignant neoplasm of liver and intrahepatic bile duct: Secondary | ICD-10-CM

## 2016-01-22 DIAGNOSIS — C7989 Secondary malignant neoplasm of other specified sites: Secondary | ICD-10-CM

## 2016-01-22 DIAGNOSIS — Z7901 Long term (current) use of anticoagulants: Secondary | ICD-10-CM

## 2016-01-22 DIAGNOSIS — C778 Secondary and unspecified malignant neoplasm of lymph nodes of multiple regions: Secondary | ICD-10-CM

## 2016-01-22 DIAGNOSIS — C50212 Malignant neoplasm of upper-inner quadrant of left female breast: Secondary | ICD-10-CM

## 2016-01-22 DIAGNOSIS — I2699 Other pulmonary embolism without acute cor pulmonale: Secondary | ICD-10-CM

## 2016-01-22 DIAGNOSIS — C7951 Secondary malignant neoplasm of bone: Secondary | ICD-10-CM

## 2016-01-22 LAB — CBC WITH DIFFERENTIAL/PLATELET
BASO%: 0.4 % (ref 0.0–2.0)
BASOS ABS: 0 10*3/uL (ref 0.0–0.1)
EOS%: 2.5 % (ref 0.0–7.0)
Eosinophils Absolute: 0.1 10*3/uL (ref 0.0–0.5)
HCT: 34.6 % — ABNORMAL LOW (ref 34.8–46.6)
HGB: 11.5 g/dL — ABNORMAL LOW (ref 11.6–15.9)
LYMPH%: 11.3 % — AB (ref 14.0–49.7)
MCH: 33.4 pg (ref 25.1–34.0)
MCHC: 33.2 g/dL (ref 31.5–36.0)
MCV: 100.6 fL (ref 79.5–101.0)
MONO#: 0.4 10*3/uL (ref 0.1–0.9)
MONO%: 7 % (ref 0.0–14.0)
NEUT#: 4.4 10*3/uL (ref 1.5–6.5)
NEUT%: 78.8 % — AB (ref 38.4–76.8)
Platelets: 186 10*3/uL (ref 145–400)
RBC: 3.44 10*6/uL — AB (ref 3.70–5.45)
RDW: 17.6 % — ABNORMAL HIGH (ref 11.2–14.5)
WBC: 5.6 10*3/uL (ref 3.9–10.3)
lymph#: 0.6 10*3/uL — ABNORMAL LOW (ref 0.9–3.3)

## 2016-01-22 LAB — COMPREHENSIVE METABOLIC PANEL
ALK PHOS: 187 U/L — AB (ref 40–150)
ALT: 39 U/L (ref 0–55)
AST: 137 U/L — AB (ref 5–34)
Albumin: 2.5 g/dL — ABNORMAL LOW (ref 3.5–5.0)
Anion Gap: 14 mEq/L — ABNORMAL HIGH (ref 3–11)
BUN: 7.9 mg/dL (ref 7.0–26.0)
CALCIUM: 8.1 mg/dL — AB (ref 8.4–10.4)
CHLORIDE: 96 meq/L — AB (ref 98–109)
CO2: 20 meq/L — AB (ref 22–29)
Creatinine: 0.6 mg/dL (ref 0.6–1.1)
EGFR: >90 mL/min/{1.73_m2} (ref >90)
Glucose: 93 mg/dl (ref 70–140)
POTASSIUM: 3.9 meq/L (ref 3.5–5.1)
Sodium: 130 mEq/L — ABNORMAL LOW (ref 136–145)
Total Bilirubin: 1.24 mg/dL — ABNORMAL HIGH (ref 0.20–1.20)
Total Protein: 5.6 g/dL — ABNORMAL LOW (ref 6.4–8.3)

## 2016-01-22 MED ORDER — SODIUM CHLORIDE 0.9% FLUSH
10.0000 mL | INTRAVENOUS | Status: DC | PRN
  Administered 2016-01-22: 10 mL via INTRAVENOUS
  Filled 2016-01-22: qty 10

## 2016-01-22 MED ORDER — HEPARIN SOD (PORK) LOCK FLUSH 100 UNIT/ML IV SOLN
500.0000 [IU] | Freq: Once | INTRAVENOUS | Status: AC
  Administered 2016-01-22: 500 [IU] via INTRAVENOUS
  Filled 2016-01-22: qty 5

## 2016-01-22 MED ORDER — SODIUM CHLORIDE 0.9 % IV SOLN
INTRAVENOUS | Status: DC
  Administered 2016-01-22: 14:00:00 via INTRAVENOUS

## 2016-01-22 NOTE — Progress Notes (Signed)
1500: Entered patient area. Informed patient that she has 40 minutes of fluid left. Patient states that she does not want the rest of the fluid and would like to be discharged. MD Gudena notified/agrees. Patient discharged in stable condition via wheelchair. This RN informed patient nurse.

## 2016-01-22 NOTE — Patient Instructions (Signed)

## 2016-01-22 NOTE — Progress Notes (Signed)
Referral made to [facility]. 

## 2016-01-22 NOTE — Progress Notes (Signed)
Patient Care Team: Antony Blackbird, MD as PCP - General (Family Medicine)  DIAGNOSIS: Breast cancer of upper-inner quadrant of left female breast [facility])   Staging form: Breast, AJCC 7th Edition     Clinical: Stage IIIB (T4b, N0, M0) - Signed by Rulon Eisenmenger, MD on 01/17/2015   SUMMARY OF ONCOLOGIC HISTORY:   Breast cancer of upper-inner quadrant of left female breast ([place])   12/27/2014 Initial Diagnosis Grade 3 invasive mammary cancer, one lymph node negative in left axilla, ER 0%, PR 0%, Ki-67 98%, HER-2 negative ratio 1.38   01/06/2015 Breast MRI Left breast large enhancing mass, evidence of necrosis, extending from posterior through the middle third of left breast, extending from chest wall to the dermis, close to pectoralis fascia extending 4.5 cm, several bilateral axillary lymph nodes   01/17/2015 - 05/30/2015 Neo-Adjuvant Chemotherapy Neoadjuvant chemotherapy with dose dense Adriamycin and Cytoxan followed by Taxol and carboplatin   01/24/2015 - 01/27/2015 Hospital Admission Neutropenic fever admission, no source identified   06/08/2015 Breast MRI Interval erosion of the known carcinoma within the superior left breast through the overlying skin. The majority of the mass is now exophytic, decrease in Left Axill LN   06/23/2015 Surgery Left simple mastectomy: Invasive ductal carcinoma grade 3; 6.5 cm, lymphovascular invasion, margins negative, 0/3 lymph nodes negative, T4b N0M0 stage IIIB pathologic staging, ER 0%, PR 0%, HER-2 negative, Ki-67 98%   08/21/2015 Relapse/Recurrence Chest wall recurrence excision positive for breast cancer HER-2 negative   09/05/2015 - 10/23/2015 Radiation Therapy Radiation with Xeloda 1000 mg by mouth twice a day   11/09/2015 -  Chemotherapy Xeloda 1500 bid increased to 2000 mg bid starting 11/30/15   11/24/2015 Mammogram Palpable right axillary lymph node: Mammogram and ultrasound revealed group of abnormal appearing lymph nodes largest measures 3.3 x 1.8 x 1.9 cm   12/12/2015 PET scan Extensive hypermetabolic disease to the right axilla, lungs, liver, abdominal lymph nodes, left lamina of T12, bilateral femoral head AVN   12/26/2015 -  Chemotherapy Palliative chemo with Halaven days 1 and 8 q 3 weeks    CHIEF COMPLIANT: severe friability, generalized weakness  INTERVAL HISTORY: Patty Buckley is a 65 year old with above-mentioned history of metastatic breast cancer who is here after receiving 3 days a week off IV fluids and starting on oral anticoagulation with xarelto for acute pulmonary embolus. For the past week her breathing has slightly improved but not significantly. She continues to remain frail and weak and requires wheelchair for ambulation. She does not appear to be in any shape or form for additional treatments at this time.  REVIEW OF SYSTEMS:   Constitutional: Denies fevers, chills or abnormal weight loss Eyes: Denies blurriness of vision Ears, nose, mouth, throat, and face: dry mouth Respiratory: shortness of breath Cardiovascular: Denies palpitation, chest discomfort Gastrointestinal: no appetite and not eating food Skin: Denies abnormal skin rashes Lymphatics: Denies new lymphadenopathy or easy bruising Neurological:generalized peripheral weakness Behavioral/Psych: Mood is stable, no new changes  Extremities: No lower extremity edema All other systems were reviewed with the patient and are negative.  I have reviewed the past medical history, past surgical history, social history and family history with the patient and they are unchanged from previous note.  ALLERGIES:  is allergic to codeine.  MEDICATIONS:  Current Outpatient Prescriptions  Medication Sig Dispense Refill  . HYDROcodone-acetaminophen (NORCO/VICODIN) 5-325 MG tablet Take 1-2 tablets by mouth every 4 (four) hours as needed for moderate pain. 60 tablet 0  . lidocaine-prilocaine (EMLA)  cream Apply to affected area once 30 g 3  . metoprolol (LOPRESSOR) 50 MG tablet Take  1 tablet (50 mg total) by mouth 2 (two) times daily. 180 tablet 3  . Multiple Vitamin (MULTIVITAMIN) tablet Take 1 tablet by mouth daily.     . ondansetron (ZOFRAN) 8 MG tablet Take 1 tablet (8 mg total) by mouth 2 (two) times daily. Start the day after chemo for 2 days. Then take as needed for nausea or vomiting. 30 tablet 1  . potassium chloride SA (K-DUR,KLOR-CON) 20 MEQ tablet Take 1 tablet (20 mEq total) by mouth daily. 10 tablet 0  . prochlorperazine (COMPAZINE) 10 MG tablet Take 1 tablet (10 mg total) by mouth every 6 (six) hours as needed (Nausea or vomiting). 30 tablet 1  . Rivaroxaban (XARELTO STARTER PACK) 15 & 20 MG TBPK Take 15 mg by mouth 2 times daily at 12 noon and 4 pm. Start with one 79m tablet by mouth twice a day with food. 42 each 0  . traMADol (ULTRAM) 50 MG tablet Take 1 tablet (50 mg total) by mouth every 6 (six) hours as needed. 60 tablet 0   Current Facility-Administered Medications  Medication Dose Route Frequency Provider Last Rate Last Dose  . 0.9 %  sodium chloride infusion   Intravenous Continuous VNicholas Lose MD 500 mL/hr at 01/22/16 1343     Facility-Administered Medications Ordered in Other Visits  Medication Dose Route Frequency Provider Last Rate Last Dose  . heparin lock flush 100 unit/mL  500 Units Intravenous Once VNicholas Lose MD      . sodium chloride flush (NS) 0.9 % injection 10 mL  10 mL Intravenous PRN VNicholas Lose MD        PHYSICAL EXAMINATION: ECOG PERFORMANCE STATUS: 3 - Symptomatic, >50% confined to bed  Filed Vitals:   01/22/16 1309  BP: 112/78  Pulse: 136  Temp: 98.5 F (36.9 C)  Resp: 22   Filed Weights    GENERAL:alert, no distress and comfortable SKIN: skin color, texture, turgor are normal, no rashes or significant lesions EYES: normal, Conjunctiva are pink and non-injected, sclera clear OROPHARYNX:dry oral mucosa NECK: supple, thyroid normal size, non-tender, without nodularity LYMPH:  no palpable lymphadenopathy in the  cervical, axillary or inguinal LUNGS: shallow breathing HEART: regular rate & rhythm and no murmurs and no lower extremity edema ABDOMEN:abdomen soft, non-tender and normal bowel sounds MUSCULOSKELETAL:no cyanosis of digits and no clubbing  NEURO: generalized weakness EXTREMITIES: No lower extremity edema  LABORATORY DATA:  I have reviewed the data as listed   Chemistry      Component Value Date/Time   NA 130* 01/22/2016 1202   NA 137 06/24/2015 0550   K 3.9 01/22/2016 1202   K 4.2 06/24/2015 0550   CL 106 06/24/2015 0550   CO2 20* 01/22/2016 1202   CO2 24 06/24/2015 0550   BUN 7.9 01/22/2016 1202   BUN 7 06/24/2015 0550   CREATININE 0.6 01/22/2016 1202   CREATININE 0.73 06/24/2015 0550      Component Value Date/Time   CALCIUM 8.1* 01/22/2016 1202   CALCIUM 8.3* 06/24/2015 0550   ALKPHOS 187* 01/22/2016 1202   ALKPHOS 58 06/24/2015 0550   AST 137* 01/22/2016 1202   AST 16 06/24/2015 0550   ALT 39 01/22/2016 1202   ALT 18 06/24/2015 0550   BILITOT 1.24* 01/22/2016 1202   BILITOT 0.2* 06/24/2015 0550       Lab Results  Component Value Date   WBC 5.6 01/22/2016  HGB 11.5* 01/22/2016   HCT 34.6* 01/22/2016   MCV 100.6 01/22/2016   PLT 186 01/22/2016   NEUTROABS 4.4 01/22/2016   ASSESSMENT & PLAN:  Breast cancer of upper-inner quadrant of left female breast ([facility])  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 Molli Posey on 08/21/2015 Adjuvant radiation therapy with concurrent Xeloda completed  10/23/2015 Xeloda (based on results from CREATE-X study) 2000 mg by mouth twice a day discontinued 12/14/2015 PET CT scan [id number] hypermetabolic disease to the right axilla (largest 4.7 cm laminated mass), lungs (6 mm Rt, 1.8 cm LLL nodules), liver (6X 7.1 cm; 6.4 x 4.8 cm), abdominal lymph nodes ( 1cm porta hepatis LN, left peri-aortic RP LN 5 mm), left lamina of T12, bilateral femoral head AVN  Treatment: Halaven started 12/26/15 D/Ced after 1 cycle -------------------------------------------------------------------------------------------------------------------------- Acute PE: on Xarelto started 01/15/16 CT 01/15/16: Progression of disease  Rec fevers Depression:  Patient received IV fluids 3 times a week over the last week and still has not had any improvement in her performance status. She is extremely frail and fatigued. Her breathing is still very shallow. Although maybe slightly better.  Prognosis: I discussed with the patient and her son as well as her sister that her current clinical condition has deteriorated significantly and that she is not eligible to receive any additional systemic chemotherapy. I strongly recommended enrollment in hospice care to focus on comfort care alone. I do not believe IV fluids are comfort care measure. So after today we will discontinue IV fluids.  I will consult hospice care. DO NOT RESUSCITATE CC  No orders of the defined types were placed in this encounter.   The patient has a good understanding of the overall plan. she agrees with it. she will call with any problems that may develop before the next visit here.   Rulon Eisenmenger, MD 01/22/2016

## 2016-01-22 NOTE — Progress Notes (Signed)
Unable to get in to exam room prior to MD.  No assessment performed.  

## 2016-01-22 NOTE — Patient Instructions (Signed)

## 2016-01-23 ENCOUNTER — Ambulatory Visit: Payer: Medicare Other | Admitting: Hematology and Oncology

## 2016-01-23 ENCOUNTER — Ambulatory Visit: Payer: Medicare Other

## 2016-01-23 ENCOUNTER — Other Ambulatory Visit: Payer: Medicare Other

## 2016-01-24 ENCOUNTER — Telehealth: Payer: Self-pay | Admitting: *Deleted

## 2016-01-24 NOTE — Telephone Encounter (Signed)
Verbal order for DNR.  Verbal order to stop aspirin per advice of Dr. Lyman Speller.  [name] will send orders for signature.

## 2016-01-24 NOTE — Telephone Encounter (Signed)
Patty Buckley with Hospice called  requesting DNR and also it is ok for Hospice MD to sign DNR?  Please call her back at [phone].

## 2016-02-01 ENCOUNTER — Telehealth: Payer: Self-pay | Admitting: *Deleted

## 2016-02-01 NOTE — Telephone Encounter (Signed)
Patient's sister called to let us know that Latanga passed away on 03-19-23.

## 2016-02-07 ENCOUNTER — Encounter: Payer: Self-pay | Admitting: *Deleted

## 2016-02-07 NOTE — Progress Notes (Signed)
Notes received from [facility], reviewed by Dr. Gudena, sent to scan. 

## 2016-02-07 DEATH — deceased

## 2016-02-08 ENCOUNTER — Telehealth: Payer: Self-pay | Admitting: Hematology and Oncology

## 2016-02-08 NOTE — Telephone Encounter (Signed)
Contacted [employer] to pick up certificate and faxed

## 2016-03-08 ENCOUNTER — Encounter: Payer: Self-pay | Admitting: Hematology and Oncology

## 2016-03-08 NOTE — Progress Notes (Signed)
I sent form that terri completed to medical records. They were faxed 12/05/15- see prev notes

## 2016-05-29 ENCOUNTER — Other Ambulatory Visit: Payer: Self-pay | Admitting: Nurse Practitioner

## 2016-07-05 IMAGING — MG MM DIGITAL DIAGNOSTIC BILAT
5 series · 5 of 5 positions shown · non-contrast
Comparison: Priors

CLINICAL DATA: Patient presents for evaluation of palpable left
breast abnormality.

EXAM:
DIGITAL DIAGNOSTIC  BILATERAL MAMMOGRAM WITH CAD
ULTRASOUND LEFT BREAST

[L TAN]
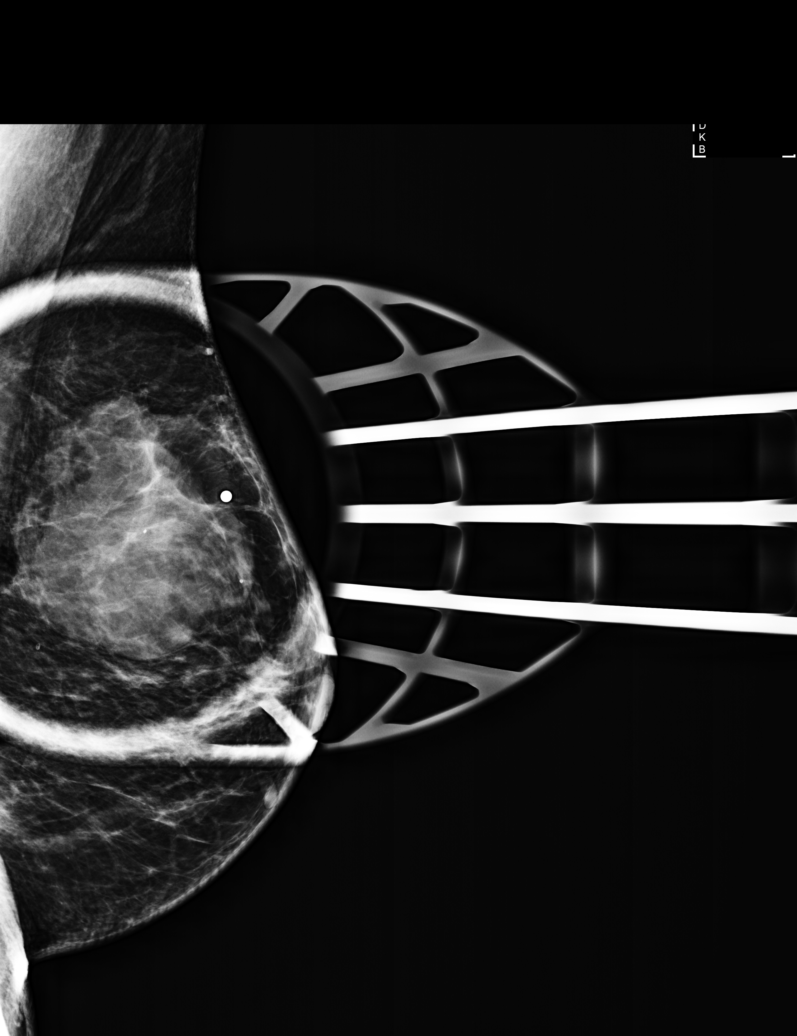

[R CC]
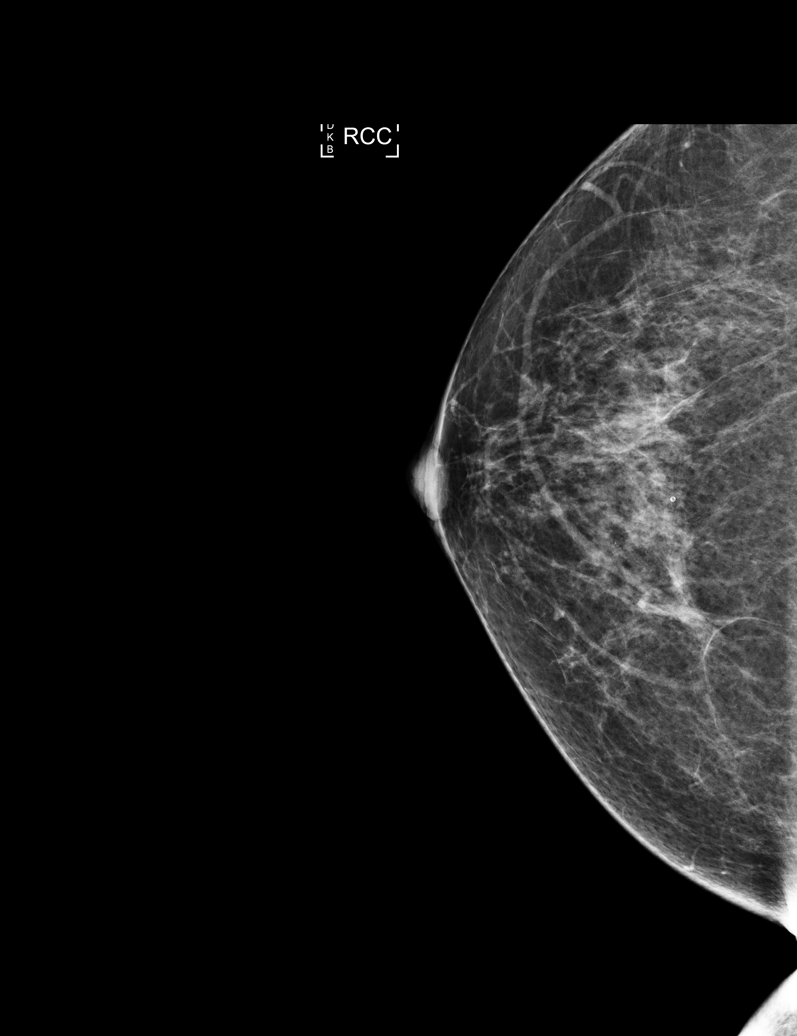

[R MLO]
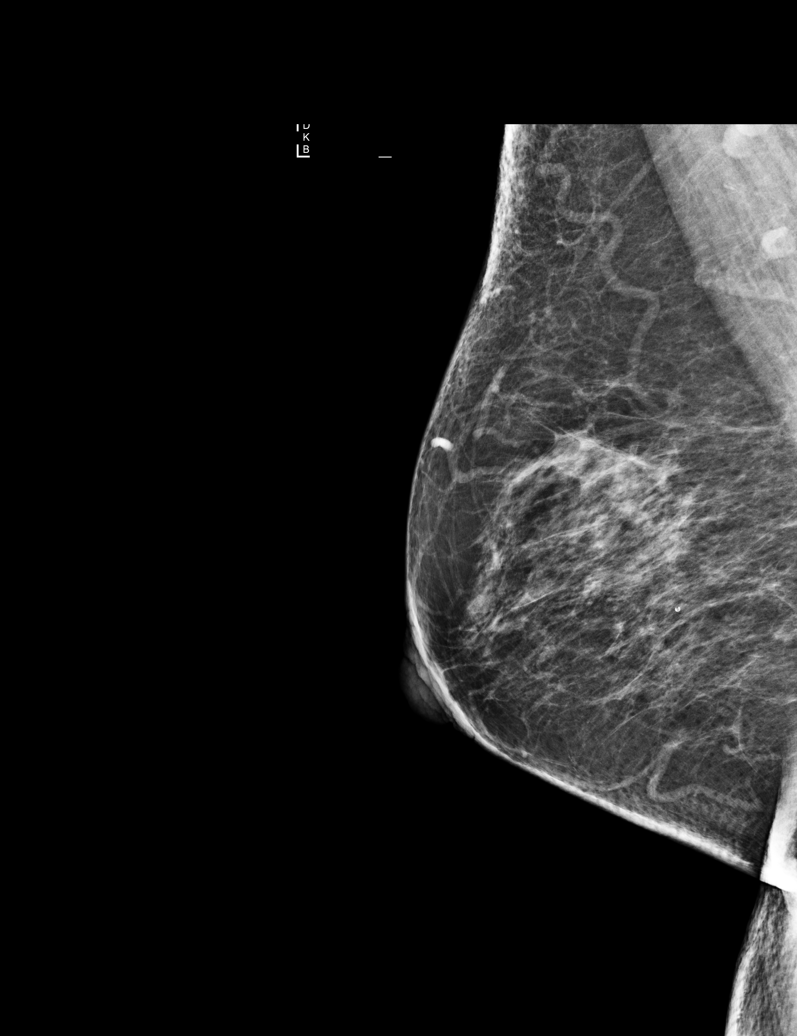

[L MLO]
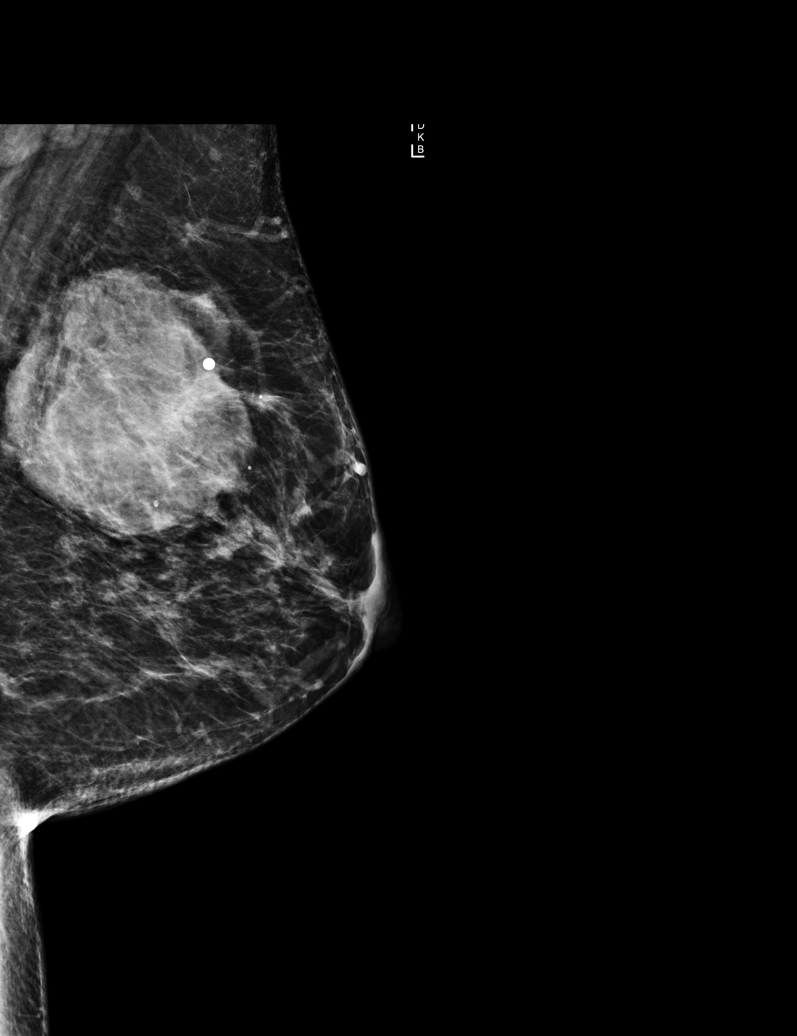

[L CC]
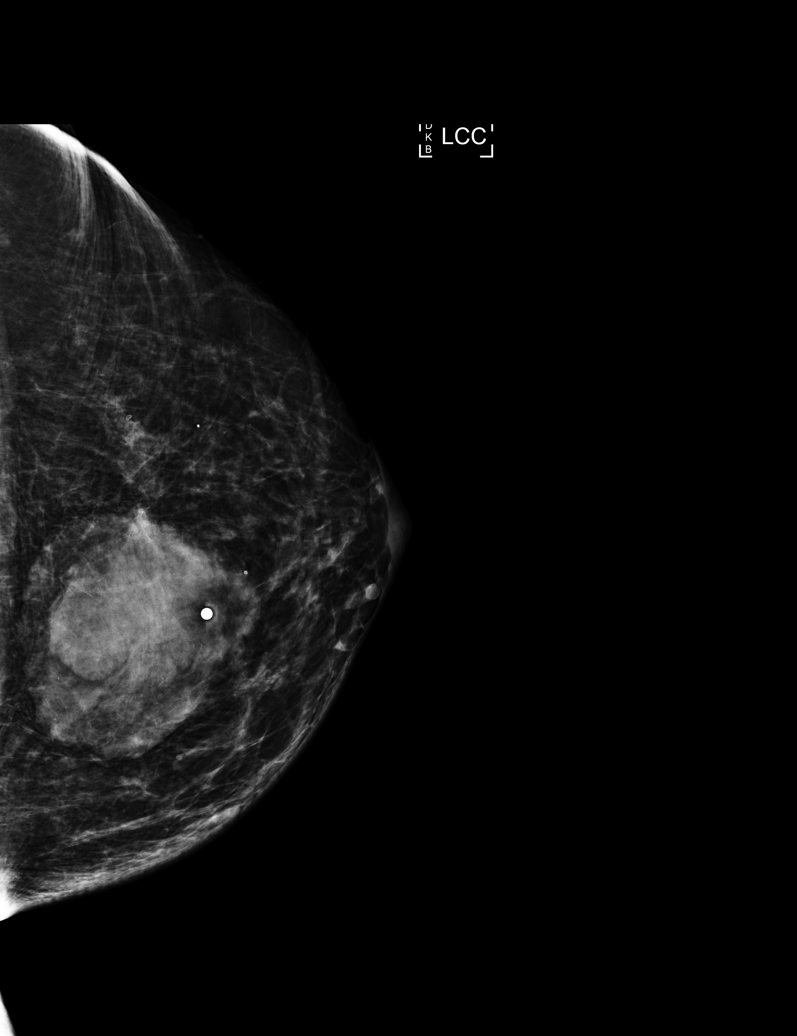

[5 of 5 positions shown; findings below may reference images not displayed]

ACR Breast Density Category b: There are scattered areas of
fibroglandular density.
FINDINGS: Underlying the palpable marker within the upper left breast is a
cm irregular mass. No additional concerning masses, calcifications
or areas of architectural distortion identified within either
breast.

Mammographic images were processed with CAD.

On physical exam, I palpate a firm mass within the superior aspect
of the left breast.

Ultrasound is performed, showing a 4.7 x 2.8 x 4.7 cm irregular
solid mass within the left breast 8216 o'clock 4 cm from the nipple
with internal color vascularity. There is a cortically thickened
cm left axillary lymph node.
IMPRESSION: Suspicious left breast mass.

Cortically thickened left axillary lymph node.

RECOMMENDATION:
Ultrasound-guided core needle biopsy left breast mass and left
axillary lymph node.

Patient will be referred to [REDACTED].

I have discussed the findings and recommendations with the patient.
Results were also provided in writing at the conclusion of the
visit. If applicable, a reminder letter will be sent to the patient
regarding the next appointment.

BI-RADS CATEGORY  4: Suspicious.

## 2016-11-16 ENCOUNTER — Other Ambulatory Visit: Payer: Self-pay | Admitting: Nurse Practitioner
